# Patient Record
Sex: Male | Born: 1950 | Race: Black or African American | Hispanic: No | Marital: Single | Smoking: Never smoker
Health system: Southern US, Community
[De-identification: ages and names within clinical notes are randomized; demographics above are authoritative.]

## PROBLEM LIST (undated history)

## (undated) DIAGNOSIS — I10 Essential (primary) hypertension: Secondary | ICD-10-CM

## (undated) DIAGNOSIS — Z972 Presence of dental prosthetic device (complete) (partial): Secondary | ICD-10-CM

## (undated) DIAGNOSIS — J45909 Unspecified asthma, uncomplicated: Secondary | ICD-10-CM

## (undated) DIAGNOSIS — E7800 Pure hypercholesterolemia, unspecified: Secondary | ICD-10-CM

## (undated) DIAGNOSIS — R0602 Shortness of breath: Secondary | ICD-10-CM

## (undated) HISTORY — DX: Unspecified asthma, uncomplicated: J45.909

## (undated) HISTORY — DX: Shortness of breath: R06.02

## (undated) HISTORY — DX: Essential (primary) hypertension: I10

---

## 1986-07-17 HISTORY — PX: HERNIA REPAIR: SHX51

## 1998-02-23 ENCOUNTER — Emergency Department (HOSPITAL_COMMUNITY): Admission: EM | Admit: 1998-02-23 | Discharge: 1998-02-23 | Payer: Self-pay | Admitting: Emergency Medicine

## 1998-11-27 ENCOUNTER — Emergency Department (HOSPITAL_COMMUNITY): Admission: EM | Admit: 1998-11-27 | Discharge: 1998-11-27 | Payer: Self-pay | Admitting: Emergency Medicine

## 1999-08-27 ENCOUNTER — Emergency Department (HOSPITAL_COMMUNITY): Admission: EM | Admit: 1999-08-27 | Discharge: 1999-08-27 | Payer: Self-pay | Admitting: Emergency Medicine

## 1999-08-27 ENCOUNTER — Encounter: Payer: Self-pay | Admitting: Emergency Medicine

## 2000-10-12 ENCOUNTER — Emergency Department (HOSPITAL_COMMUNITY): Admission: EM | Admit: 2000-10-12 | Discharge: 2000-10-12 | Payer: Self-pay | Admitting: Emergency Medicine

## 2003-03-26 ENCOUNTER — Emergency Department (HOSPITAL_COMMUNITY): Admission: EM | Admit: 2003-03-26 | Discharge: 2003-03-26 | Payer: Self-pay | Admitting: Emergency Medicine

## 2003-07-20 ENCOUNTER — Emergency Department (HOSPITAL_COMMUNITY): Admission: EM | Admit: 2003-07-20 | Discharge: 2003-07-20 | Payer: Self-pay | Admitting: Emergency Medicine

## 2003-09-19 ENCOUNTER — Emergency Department (HOSPITAL_COMMUNITY): Admission: EM | Admit: 2003-09-19 | Discharge: 2003-09-19 | Payer: Self-pay | Admitting: Emergency Medicine

## 2003-12-15 ENCOUNTER — Emergency Department (HOSPITAL_COMMUNITY): Admission: EM | Admit: 2003-12-15 | Discharge: 2003-12-15 | Payer: Self-pay | Admitting: Emergency Medicine

## 2009-07-17 HISTORY — PX: CARDIAC CATHETERIZATION: SHX172

## 2009-10-16 ENCOUNTER — Encounter: Payer: Self-pay | Admitting: Cardiovascular Disease

## 2009-10-16 ENCOUNTER — Emergency Department (HOSPITAL_COMMUNITY): Admission: EM | Admit: 2009-10-16 | Discharge: 2009-10-16 | Payer: Self-pay | Admitting: Emergency Medicine

## 2009-10-19 DIAGNOSIS — I10 Essential (primary) hypertension: Secondary | ICD-10-CM

## 2009-10-19 DIAGNOSIS — R079 Chest pain, unspecified: Secondary | ICD-10-CM

## 2009-10-19 DIAGNOSIS — J45909 Unspecified asthma, uncomplicated: Secondary | ICD-10-CM

## 2009-10-20 ENCOUNTER — Ambulatory Visit: Payer: Self-pay | Admitting: Cardiovascular Disease

## 2009-10-20 ENCOUNTER — Encounter (INDEPENDENT_AMBULATORY_CARE_PROVIDER_SITE_OTHER): Payer: Self-pay | Admitting: *Deleted

## 2009-12-24 ENCOUNTER — Encounter (INDEPENDENT_AMBULATORY_CARE_PROVIDER_SITE_OTHER): Payer: Self-pay | Admitting: *Deleted

## 2010-01-20 ENCOUNTER — Ambulatory Visit: Payer: Self-pay | Admitting: Internal Medicine

## 2010-01-20 ENCOUNTER — Encounter: Payer: Self-pay | Admitting: Physician Assistant

## 2010-01-26 ENCOUNTER — Telehealth (INDEPENDENT_AMBULATORY_CARE_PROVIDER_SITE_OTHER): Payer: Self-pay | Admitting: *Deleted

## 2010-01-27 ENCOUNTER — Encounter: Payer: Self-pay | Admitting: Cardiovascular Disease

## 2010-01-27 ENCOUNTER — Ambulatory Visit: Payer: Self-pay | Admitting: Cardiology

## 2010-01-27 ENCOUNTER — Encounter: Payer: Self-pay | Admitting: Cardiology

## 2010-01-27 ENCOUNTER — Ambulatory Visit: Payer: Self-pay

## 2010-01-27 ENCOUNTER — Encounter (HOSPITAL_COMMUNITY): Admission: RE | Admit: 2010-01-27 | Discharge: 2010-03-23 | Payer: Self-pay | Admitting: Cardiovascular Disease

## 2010-01-27 ENCOUNTER — Telehealth (INDEPENDENT_AMBULATORY_CARE_PROVIDER_SITE_OTHER): Payer: Self-pay | Admitting: *Deleted

## 2010-02-03 ENCOUNTER — Ambulatory Visit: Payer: Self-pay | Admitting: Cardiovascular Disease

## 2010-02-03 ENCOUNTER — Telehealth (INDEPENDENT_AMBULATORY_CARE_PROVIDER_SITE_OTHER): Payer: Self-pay | Admitting: *Deleted

## 2010-02-03 ENCOUNTER — Ambulatory Visit (HOSPITAL_COMMUNITY): Admission: RE | Admit: 2010-02-03 | Discharge: 2010-02-03 | Payer: Self-pay | Admitting: Cardiovascular Disease

## 2010-02-07 ENCOUNTER — Encounter (INDEPENDENT_AMBULATORY_CARE_PROVIDER_SITE_OTHER): Payer: Self-pay | Admitting: *Deleted

## 2010-02-28 ENCOUNTER — Ambulatory Visit: Payer: Self-pay | Admitting: Cardiovascular Disease

## 2010-03-01 ENCOUNTER — Encounter (INDEPENDENT_AMBULATORY_CARE_PROVIDER_SITE_OTHER): Payer: Self-pay | Admitting: *Deleted

## 2010-03-07 ENCOUNTER — Telehealth (INDEPENDENT_AMBULATORY_CARE_PROVIDER_SITE_OTHER): Payer: Self-pay | Admitting: *Deleted

## 2010-03-07 ENCOUNTER — Telehealth: Payer: Self-pay | Admitting: Cardiovascular Disease

## 2010-03-08 ENCOUNTER — Ambulatory Visit (HOSPITAL_COMMUNITY): Admission: RE | Admit: 2010-03-08 | Discharge: 2010-03-08 | Payer: Self-pay | Admitting: Cardiovascular Disease

## 2010-05-27 ENCOUNTER — Ambulatory Visit: Payer: Self-pay | Admitting: Cardiology

## 2010-05-27 ENCOUNTER — Ambulatory Visit: Payer: Self-pay | Admitting: Cardiovascular Disease

## 2010-05-27 DIAGNOSIS — I472 Ventricular tachycardia, unspecified: Secondary | ICD-10-CM | POA: Insufficient documentation

## 2010-05-31 ENCOUNTER — Encounter: Payer: Self-pay | Admitting: Cardiovascular Disease

## 2010-06-07 ENCOUNTER — Ambulatory Visit: Payer: Self-pay | Admitting: Cardiovascular Disease

## 2010-06-24 ENCOUNTER — Ambulatory Visit: Payer: Self-pay | Admitting: Pulmonary Disease

## 2010-06-24 ENCOUNTER — Encounter: Payer: Self-pay | Admitting: Pulmonary Disease

## 2010-08-18 NOTE — Letter (Signed)
Summary: Appointment - Missed  [facility], Main Office  [address]   [place], [place] [address]   Phone: [phone]  Fax: [phone]     December 24, 2009 MRN: [id number]   [facility] [address] [place], [place]  [address]   Dear Benjamin Hamilton,  Our records indicate you missed your appointment on 12/17/2009 with Dr. Eden Emms. It is very important that we reach you to reschedule this appointment. We look forward to participating in your health care needs. Please contact us at the number listed above at your earliest convenience to reschedule this appointment.     Sincerely,   [facility] Scheduling Team

## 2010-08-18 NOTE — Letter (Signed)
Summary: Cardiac Catheterization Instructions- Main Lab  [facility], Main Office  [address]   [place], [place] [address]   Phone: [phone]  Fax: [phone]     01/27/2010 MRN: [id number]  [facility] [address] [place], [place]  [address]  Dear Mr. Mcclay,   You are scheduled for Cardiac Catheterization on     02/03/10          with Dr.  Eden Emms          .  Please arrive at the [facility] at     8:00  a.m./p.m. on the day of your procedure.  1. DIET     _X___ Nothing to eat or drink after midnight except your medications with a sip of water.  2. Come to the [place] office on             for lab work.  The lab at [facility] is open from 8:30 a.m. to 1:30 p.m. and 2:30 p.m. to 5:00 p.m.  The lab at [facility] is open from 7:30 a.m. to 5:30 p.m.  You do not have to be fasting.  3. MAKE SURE YOU TAKE YOUR ASPIRIN.  4. _____ DO NOT TAKE these medications before your procedure:         ________________________________________________________________________________ _     __X_ YOU MAY TAKE ALL of your remaining medications with a small amount of water.      ____ START NEW medications:     ________________________________________________________________________________      ____ Eilene Ghazi instructions:     ________________________________________________________________________________  5. Plan for one night stay - bring personal belongings (i.e. toothpaste, toothbrush, etc.)  6. Bring a current list of your medications and current insurance cards.  7. Must have a responsible person to drive you home.   8. Someone must be with you for the first 24 hours after you arrive home.  9. Please wear clothes that are easy to get on and off and wear slip-on shoes.  *Special note: Every effort is made to have your procedure done on time.  Occasionally there are emergencies that present themselves at the  hospital that may cause delays.  Please be patient if a delay does occur.  If you have any questions after you get home, please call the office at the number listed above. DR PETER NISHAN/ Scherrie Bateman, LPN

## 2010-08-18 NOTE — Letter (Signed)
Summary: Appointment - Cardiac MRI  [facility], Main Office  [address]   [place], [place] [address]   Phone: [phone]  Fax: [phone]      March 01, 2010 MRN: [id number]   [facility] [address] [place], [place]  [address]   Dear Benjamin Hamilton,   We have scheduled the above patient for an appointment for a Cardiac MRI on           at        .  Please refer to the below information for the location and instructions for this test:  Location:     [facility]       [address]       [place], [place]  [address] Instructions:    Wilmon Arms at [facility] Outpatient Registration 45 minutes prior to your appointment time.  This will ensure you are in the Radiology Department 30 minutes prior to your appointment.    There are no restrictions for this test you may eat and take medications as usual.  If you need to reschedule this appointment please call at the number listed above.  Sincerely,  Glass blower/designer

## 2010-08-18 NOTE — Assessment & Plan Note (Signed)
Summary: per check out/sf   CC:  sob.  History of Present Illness: F/U post cath. 7/21.  Abnormal myovue with 5 bts of polymorphic VT.  Cath was normal with normal EF also.  Since cath continues to have some SSCP.  Worse in recumbency at nigth ? reflux. Compliant with BP meds.  ECG with normal QT  400 with bifid T's latterally Wanted to do cardiac MRI  to R/O infiltrative disease but patient to claustraphobic. Has had abnromal interstitial lung exam  F/U CT showed  Mild diffuse bronchial wall thickening may be infectious or inflammatory in etiology.  No underlying changes of UIP.  No pulmonary parenchymal nodule or mass.  Thinks his lungs "shutdown" at night after he comes home from work.  He is exposed to "chemicals" at work.  Denies history of asthma.    Givne his continued dyspnea and abnormal CT we will get PFT's and refer to pulmonary  Asked to to go to drug store and get BP checked at least 2x/week.  May need more BP med than norvasc.    Current Problems (verified): 1)  Paroxysmal Ventricular Tachycardia  (ICD-427.1) 2)  Hypertension  (ICD-401.9) 3)  Shortness of Breath  (ICD-786.05) 4)  Chest Pain  (ICD-786.50)  Current Medications (verified): 1)  Albuterol Sulfate (5 Mg/ml) 0.5% Nebu (Albuterol Sulfate) .... As Needed 2)  Norvasc 10 Mg Tabs (Amlodipine Besylate) .... Take One Tablet By Mouth Daily.  Allergies (verified): No Known Drug Allergies  Past History:  Past Medical History: Last updated: 10/19/2009 Current Problems:  HYPERTENSION (ICD-401.9) SHORTNESS OF BREATH (ICD-786.05) CHEST PAIN (ICD-786.50) ER 4/11 Bronchospasm.  R/O D/C with inhaler and prednisone  Bronchospasm  Family History: Last updated: 05/27/2010 non contributory  Social History: Last updated: 10/19/2009 non-smoker, no drug abuse, occasional drinker  Review of Systems       Denies fever, malais, weight loss, blurry vision, decreased visual acuity, cough, sputum, hemoptysis, pleuritic  pain, palpitaitons, heartburn, abdominal pain, melena, lower extremity edema, claudication, or rash.   Vital Signs:  Patient profile:   60 year old male Height:      73 inches Weight:      233 pounds BMI:     30.85 Pulse rate:   67 / minute Resp:     14 per minute BP sitting:   150 / 89  (left arm)  Vitals Entered By: Kem Parkinson (June 07, 2010 3:25 PM)  Physical Exam  General:  Affect appropriate Healthy:  appears stated age HEENT: normal Neck supple with no adenopathy JVP normal no bruits no thyromegaly Lungs clear with no wheezing and good diaphragmatic motion Heart:  S1/S2 no murmur,rub, gallop or click PMI normal Abdomen: benighn, BS positve, no tenderness, no AAA no bruit.  No HSM or HJR Distal pulses intact with no bruits No edema Neuro non-focal Skin warm and dry    Impression & Recommendations:  Problem # 1:  PAROXYSMAL VENTRICULAR TACHYCARDIA (ICD-427.1) Non recurrent and normla EF and no CAD with normal QT  Follow His updated medication list for this problem includes:    Norvasc 10 Mg Tabs (Amlodipine besylate) .Marland Kitchen... Take one tablet by mouth daily.  Problem # 2:  HYPERTENSION (ICD-401.9) F/U with home readings  May need to add ACE His updated medication list for this problem includes:    Norvasc 10 Mg Tabs (Amlodipine besylate) .Marland Kitchen... Take one tablet by mouth daily.  Problem # 3:  SHORTNESS OF BREATH (ICD-786.05) PFT's and F/U pulmonary for persistant dyspnea  and  abnomal CT His updated medication list for this problem includes:    Norvasc 10 Mg Tabs (Amlodipine besylate) .Marland Kitchen... Take one tablet by mouth daily.  Orders: Pulmonary Referral (Pulmonary) Pulmonary Function Test (PFT)  Problem # 4:  CHEST PAIN (ICD-786.50) May be more relfective of lung issue.  No epicardial CAD and Syndorme X or small vessel disease more nebulous to Dx and Rx His updated medication list for this problem includes:    Norvasc 10 Mg Tabs (Amlodipine besylate) .Marland Kitchen...  Take one tablet by mouth daily.  Patient Instructions: 1)  Your physician recommends that you schedule a follow-up appointment in: 3 MONTHS 2)  You have been referred to PULMONARY-SOB 3)  Your physician has recommended that you have a pulmonary function test.  Pulmonary Function Tests are a group of tests that measure how well air moves in and out of your lungs.

## 2010-08-18 NOTE — Letter (Signed)
Summary: Return To Work  [facility], Main Office  [address]   [place], [place] [address]   Phone: [phone]  Fax: [phone]    02/07/2010  TO: WHOM IT MAY CONCERN   RE: Benjamin Hamilton [address] [place]   The above named individual is under my medical care and may return to work JJ:OACZYS 02/07/10 WITH NO RESTRICTIONS  If you have any further questions or need additional information, please call.     Sincerely,    Deliah Goody, RN

## 2010-08-18 NOTE — Letter (Signed)
Summary: Appointment - Cardiac MRI  [facility], Main Office  [address]   [place], [place] [address]   Phone: [phone]  Fax: [phone]      March 01, 2010 MRN: [id number]   [facility] [address] [place], [place]  [address]   Dear Mr. Bero,   We have scheduled the above patient for an appointment for a Cardiac MRI on 03-08-2010 at 1:00 p.m.  Please refer to the below information for the location and instructions for this test:  Location:     [facility]       [address]       [place], [place]  [address] Instructions:    Wilmon Arms at [facility] Outpatient Registration 45 minutes prior to your appointment time.  This will ensure you are in the Radiology Department 30 minutes prior to your appointment.    There are no restrictions for this test you may eat and take medications as usual.  If you need to reschedule this appointment please call at the number listed above.  Sincerely,       Lorne Skeens [facility] Scheduling Team

## 2010-08-18 NOTE — Letter (Signed)
Summary: Generic Letter  [facility], Main Office  [address]   [place], [place] [address]   Phone: [phone]  Fax: [phone]        May 31, 2010 MRN: [id number]    [facility] [address] [place], [place]  [address]    Dear Mr. Schwalb,  Unable to reach you by phone both numbers we have on file have been disconnected re test results.Per Dr Eden Emms need to be seen by lung specialists please call our office to get visit scheduled.        Sincerely, Dr Asher Muir, LPN  This letter has been electronically signed by your physician.

## 2010-08-18 NOTE — Progress Notes (Signed)
Summary: Pt Restriction ?   Phone Note Other Incoming   Caller: Pt employer Summary of Call: Pt place of work needs to know if he still has restrictions on Lifting, Call Lorenza Burton back @ [phone]/Fax [phone] Initial call taken by: Denny Peon  Follow-up for Phone Call        No restrictions Follow-up by: Colon Branch, MD, [facility],  March 07, 2010 9:50 AM  Additional Follow-up for Phone Call Additional follow up Details #1::        spoke with Morrie Sheldon, note faxed to her Deliah Goody, RN  March 07, 2010 10:07 AM

## 2010-08-18 NOTE — Assessment & Plan Note (Signed)
Summary: consult for dyspnea   Visit Type:  Initial Consult Copy to:  Charlton Haws MD  CC:  pulmonary consult. Increased sob in the evening, chest pains, pt states he feels like his body shuts down, and dry cough..  History of Present Illness: The pt is a 60y/o male who I have been asked to see for dyspnea and atypical chest pain.  For the last 8-73mos the pt has been having "episodes" that occur after work, but do not occur during work.  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  Again, he does not have any issues while at work, but when he gets in his truck to come home, "everything shuts down".  He describes cough, followed by chest fluttering and chest tightness, and some foamy mucus production.  He typically begins to get short of breath as well.  The pt will go home, and if he walks a mile, his breathing "opens back up".   The symptoms occur everynight after work, but do not occur on weekends.  He denies any GERD symptoms.  He has had a cardiac myoview that showed polymorphic VT and EF 49%.  This was followed by a cath which showed no significant coronary artery obstruction, and normal EF.  The pt has had a ct chest which showed only minimal thickening of bronchial walls, but no significant process.  He has had full pfts which are totally normal.  The pt denies any h/o childhood asthma.  Current Medications (verified): 1)  Albuterol Sulfate (5 Mg/ml) 0.5% Nebu (Albuterol Sulfate) .... As Needed 2)  Norvasc 10 Mg Tabs (Amlodipine Besylate) .... Take One Tablet By Mouth Daily.  Allergies (verified): No Known Drug Allergies  Past History:  Past Medical History:  HYPERTENSION (ICD-401.9) SHORTNESS OF BREATH (ICD-786.05) CHEST PAIN (ICD-786.50) --negative cath 2011  Past Surgical History: none  Family History: Reviewed history from 05/27/2010 and no changes required. no known family history  Social  History: Reviewed history from 10/19/2009 and no changes required. no drug abuse, occasional drinker occupation: works for [employer] never smoker single  Review of Systems       The patient complains of shortness of breath at rest, productive cough, and weight change.  The patient denies shortness of breath with activity, non-productive cough, coughing up blood, chest pain, irregular heartbeats, acid heartburn, indigestion, loss of appetite, abdominal pain, difficulty swallowing, sore throat, tooth/dental problems, headaches, nasal congestion/difficulty breathing through nose, sneezing, itching, ear ache, anxiety, depression, hand/feet swelling, joint stiffness or pain, rash, change in color of mucus, and fever.    Vital Signs:  Patient profile:   60 year old male Height:      73 inches Weight:      230 pounds BMI:     30.45 O2 Sat:      96 % on Room air Temp:     98.1 degrees F oral Pulse rate:   72 / minute BP sitting:   122 / 88  (left arm) Cuff size:   large  Vitals Entered By: Carver Fila (June 24, 2010 11:07 AM)  O2 Flow:  Room air CC: pulmonary consult. Increased sob in the evening, chest pains, pt states he feels like his body shuts down, dry cough. Comments meds and allergies updated Phone number updated Carver Fila  June 24, 2010 11:21 AM    Physical Exam  General:  ow male in nad Eyes:  PERRLA and EOMI.   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  His ct chest and pfts are really unremarkable, but this would not be unexpected for occupational asthma.  My only other thought is whether this could be an atypical presentation for GERD.  I would like to set him up for a methacholine challenge test, but also treat him with a PPI in the interim.  He will let me know if his symptoms improve on the PPI so that we can cancel his pulmonary testing.    Other Orders: Consultation Level IV ([address]) Pulmonary Referral (Pulmonary)  Patient Instructions: 1)  will start you on dexilant 60mg  each am for next 2 weeks for possible reflux.  If this is not helping, will do a special test at [place] called methacholine challenge to look for asthma 2)  will arrange for followup once test results are available.  If the dexilant helps you, call me and we will cancel breathing test. 3)  stop albuterol

## 2010-08-18 NOTE — Letter (Signed)
Summary: Out of Work  [facility], Main Office  [address]   [place], [place] [address]   Phone: [phone]  Fax: [phone]    January 27, 2010   Employee:  Benjamin Hamilton    To Whom It May Concern:   For Medical reasons, please excuse the above named employee from work for the following dates:  Start:   02/03/10 AND 02/04/10 RETURN DATE PENDING JOB DUTIES  End:   PENDING  If you need additional information, please feel free to contact our office.         Sincerely,   DR Asher Muir, LPN

## 2010-08-18 NOTE — Assessment & Plan Note (Signed)
Summary: EPH/Cardiac Eval-MB   History of Present Illness: 60 yo sent from Dr Fonnie Jarvis and ER for w.u SSCP.  Seen in ER 4/2 and D/C with R/O and Rx for bronchospasm.  Rx with albuterol and steroid taper.  Reviewed records:  CXR no CHF, normal BNP, R/O, ECG no acute changes LAD and nonspecific ST/T wave changes.  He has had improvement in his symptoms since D/C.  He walks every day without SSCP.  He has never had an ETT.  Given his abnormal ECG and HTN it would be reasonable to do  a stress test on him.  We will see if we can randomize him to [facility].  Cardiovascular Risk History:      Positive major cardiovascular risk factors include male age 81 years old or older and hypertension.    Current Problems (verified): 1)  Hypertension  (ICD-401.9) 2)  Shortness of Breath  (ICD-786.05) 3)  Chest Pain  (ICD-786.50)  Current Medications (verified): 1)  Aspirin Ec 325 Mg Tbec (Aspirin) .... Take One Tablet By Mouth Daily 2)  Albuterol Sulfate (5 Mg/ml) 0.5% Nebu (Albuterol Sulfate) .... As Needed 3)  Blood Pressure Mediatcion .Marland Kitchen.. 1 Tab By Mouth Once Daily...Marland Kitchenhe Will Call us To Find Out His Medication List  Allergies (verified): No Known Drug Allergies  Past History:  Past Medical History: Last updated: 10/19/2009 Current Problems:  HYPERTENSION (ICD-401.9) SHORTNESS OF BREATH (ICD-786.05) CHEST PAIN (ICD-786.50) ER 4/11 Bronchospasm.  R/O D/C with inhaler and prednisone  Bronchospasm  Social History: Last updated: 10/19/2009 non-smoker, no drug abuse, occasional drinker  Review of Systems       Denies fever, malais, weight loss, blurry vision, decreased visual acuity, cough, sputum, SOB, hemoptysis, pleuritic pain, palpitaitons, heartburn, abdominal pain, melena, lower extremity edema, claudication, or rash.   Vital Signs:  Patient profile:   60 year old male Weight:      260 pounds Pulse rate:   64 / minute Resp:     14 per minute BP sitting:   140 / 90  (left arm)  Vitals  Entered By: Kem Parkinson (October 20, 2009 9:17 AM)  Physical Exam  General:  Affect appropriate Healthy:  appears stated age HEENT: normal Neck supple with no adenopathy JVP normal no bruits no thyromegaly Lungs clear with no wheezing and good diaphragmatic motion Heart:  S1/S2 no murmur,rub, gallop or click PMI normal Abdomen: benighn, BS positve, no tenderness, no AAA no bruit.  No HSM or HJR Distal pulses intact with no bruits No edema Neuro non-focal Skin warm and dry    Impression & Recommendations:  Problem # 1:  HYPERTENSION (ICD-401.9) Well controlled will call us with his BP pill for our records His updated medication list for this problem includes:    Aspirin Ec 325 Mg Tbec (Aspirin) .Marland Kitchen... Take one tablet by mouth daily  Problem # 2:  SHORTNESS OF BREATH (ICD-786.05) Improved likely bronchospasm.  Roids finished continue as needed inhaler His updated medication list for this problem includes:    Aspirin Ec 325 Mg Tbec (Aspirin) .Marland Kitchen... Take one tablet by mouth daily  Problem # 3:  CHEST PAIN (ICD-786.50) HTN, ECG abnormal Randomize to [facility].  Also check LV function with stress test His updated medication list for this problem includes:    Aspirin Ec 325 Mg Tbec (Aspirin) .Marland Kitchen... Take one tablet by mouth daily  Cardiovascular Risk Assessment/Plan:      The patient's hypertensive risk group is category B: At least one risk factor (excluding diabetes)  with no target organ damage.  Today's blood pressure is 140/90.       EKG Report  Procedure date:  10/16/2009  Findings:      NSR 64 LAD Nonspecific ST/T wave changes

## 2010-08-18 NOTE — Cardiovascular Report (Signed)
Summary: Pre Cath/Percutaneous Orders   Pre Cath/Percutaneous Orders   Imported By: Roderic Ovens 02/02/2010 10:28:38  _____________________________________________________________________  External Attachment:    Type:   Image     Comment:   External Document

## 2010-08-18 NOTE — Assessment & Plan Note (Signed)
Summary: Cardiology Nuclear Study  Nuclear Med Background Indications for Stress Test: Evaluation for Ischemia     Symptoms: Chest Pain with Exertion, Chest Tightness with Exertion, DOE, Fatigue with Exertion, Near Syncope, Palpitations, Rapid HR    Nuclear Pre-Procedure Cardiac Risk Factors: Hypertension Caffeine/Decaff Intake: None NPO After: 6:30 PM Lungs: Clear IV 0.9% NS with Angio Cath: 18g     IV Site: (R) AC IV Started by: Stanton Kidney EMT-P Chest Size (in) 48     Height (in): 73 Weight (lb): 239 BMI: 31.65 Tech Comments: The EKG's and Images were reviewed by Dr. Odessa Fleming, and I discussed with Dr. Eden Emms who will schedule the patient for a cath next week.Patsy Edwards,RN.  Nuclear Med Study 1 or 2 day study:  1 day     Stress Test Type:  Eugenie Birks Reading MD:  Olga Millers, MD     Referring MD:  P.Nishan Resting Radionuclide:  Technetium 23m Tetrofosmin     Resting Radionuclide Dose:  10.2 mCi  Stress Radionuclide:  Technetium 31m Tetrofosmin     Stress Radionuclide Dose:  33.0 mCi   Stress Protocol Exercise Time (min):  2:25 min     Max HR:  115 bpm Max Systolic BP: 189 mm HgRate Pressure Product:  04540  Lexiscan: 0.4 mg   Stress Test Technologist:  Irean Hong RN     Nuclear Technologist:  Domenic Polite CNMT  Rest Procedure  Myocardial perfusion imaging was performed at rest 45 minutes following the intravenous administration of Myoview Technetium 87m Tetrofosmin.  Stress Procedure  The patient  attempted treadmill but unable to reach target heartrate due to marked fatigue ,and V- ectopy with 5 beats polymorphic VT, PVCs,couplets; changed to Lexiscan.The patient received IV Lexiscan 0.4 mg over 15-seconds.  Myoview injected at 30-seconds. The EKG was nondiagnostic due to baseline changes. There were occ. PVC's and 3 beats NSVT x2 after lexiscan injection Quantitative spect images were obtained after a 45 minute delay.  QPS Raw Data Images:  There is no  interference from other nuclear activity. Stress Images:  There is decreased uptake in the inferior wall. Rest Images:  There is decreased uptake in the inferior wall. Subtraction (SDS):  No evidence of ischemia. Transient Ischemic Dilatation:  1.08  (Normal <1.22)  Lung/Heart Ratio:  .28  (Normal <0.45)  Quantitative Gated Spect Images QGS EDV:  152 ml QGS ESV:  82 ml QGS EF:  46 % QGS cine images:  Visually, LV function appears to be normal with normal wall motion; evidence of LVE; suggest echo to better assess.   Overall Impression  Exercise Capacity: Lexiscan study with no exercise. BP Response: Normal blood pressure response. Clinical Symptoms: No chest pain ECG Impression: No significant ST segment change suggestive of ischemia; 5 beats NSVT with exercise; study changed to lexiscan stress. Overall Impression: There appears to be inferior thinning but no sign of scar or ischemia; patient seen by Dr. Eden Emms and cath. scheduled for next week.

## 2010-08-18 NOTE — Assessment & Plan Note (Signed)
Summary: @ 12/pt. c/o uncomfortable feeling in his chest without pain/sl   Visit Type:  Follow-up  CC:  chest pain and sob.  History of Present Illness: This is a 60 year old African American male patient who is here today because of recurrent chest pain occurring while lifting 3-400 pounds at work. He had been in the ER back in April with chest pain and was referred to Dr. Eden Emms for followup. He was enrolled in the Promise study and stress Myoview was ordered. The patient was a no-show an hour had this done.  The patient says he lifts 300-400 pounds at work in feels like someone is hitting him in the chest and he gets short of breath he also develops a cough. He is a poor historian it is hard to get a detailed history from him. He denies sharp shooting pains pressure or tightness dizziness or presyncope. The chest pain usually only occurs with the heavy lifting.  Current Medications (verified): 1)  Albuterol Sulfate (5 Mg/ml) 0.5% Nebu (Albuterol Sulfate) .... As Needed 2)  Norvasc 5 Mg Tabs (Amlodipine Besylate) .... Take One Daily  Allergies: No Known Drug Allergies  Past History:  Past Medical History: Last updated: 10/19/2009 Current Problems:  HYPERTENSION (ICD-401.9) SHORTNESS OF BREATH (ICD-786.05) CHEST PAIN (ICD-786.50) ER 4/11 Bronchospasm.  R/O D/C with inhaler and prednisone  Bronchospasm  Review of Systems       see history of present illness  Vital Signs:  Patient profile:   60 year old male Height:      73 inches Weight:      240 pounds BMI:     31.78 Pulse rate:   68 / minute Pulse rhythm:   regular BP sitting:   140 / 90  (right arm)  Vitals Entered By: Jacquelin Hawking, CMA (January 20, 2010 11:58 AM)  Physical Exam  General:   Well-nournished, in no acute distress. Neck: No JVD, HJR, Bruit, or thyroid enlargement Lungs: No tachypnea, clear without wheezing, rales, or rhonchi Cardiovascular: RRR, PMI not displaced, heart sounds normal, no murmurs,  gallops, bruit, thrill, or heave. Abdomen: BS normal. Soft without organomegaly, masses, lesions or tenderness. Extremities: without cyanosis, clubbing or edema. Good distal pulses bilateral SKin: Warm, no lesions or rashes  Musculoskeletal: No deformities Neuro: no focal signs    EKG  Procedure date:  01/20/2010  Findings:      normal sinus rhythm with T wave inversion laterally no acute change  Impression & Recommendations:  Problem # 1:  CHEST PAIN (ICD-786.50) Patient continues to have exertional chest pain. I told him he needed to have a stress Myoview. I did give him a note to temporarily stop heavy lifting at work until we performed a stress Myoview. He says he will try to work out a time that he can come. The following medications were removed from the medication list:    Aspirin Ec 325 Mg Tbec (Aspirin) .Marland Kitchen... Take one tablet by mouth daily His updated medication list for this problem includes:    Norvasc 10 Mg Tabs (Amlodipine besylate) .Marland Kitchen... Take one tablet by mouth daily.  Orders: EKG w/ Interpretation (93000) Nuclear Stress Test (Nuc Stress Test)  Problem # 2:  HYPERTENSION (ICD-401.9) Patient's blood pressure is elevated. We will increase his Norvasc to 10 mg daily. Patient had left the office before we could give him this increase in his Norvasc. His numbers that we have are all disconnected. We will give him a prescription when he returns July 14 for  a stress test. The following medications were removed from the medication list:    Aspirin Ec 325 Mg Tbec (Aspirin) .Marland Kitchen... Take one tablet by mouth daily His updated medication list for this problem includes:    Norvasc 10 Mg Tabs (Amlodipine besylate) .Marland Kitchen... Take one tablet by mouth daily.  Patient Instructions: 1)  Your physician recommends that you schedule a follow-up appointment in: 1 months with Dr. Eden Emms 2)  Your physician has requested that you have an exercise stress myoview.  For further information please  visit [email or web address]  Please follow instruction sheet, as given. 3)  No heavy lifting. 4)  Increase Norvasc to 5 mg take 2 tablets once a day. Prescriptions: NORVASC 10 MG TABS (AMLODIPINE BESYLATE) take one tablet by mouth daily.  #30 x 8   Entered by:   Ollen Gross, RN, BSN   Authorized by:   Marletta Lor, PA-C   Signed by:   Ollen Gross, RN, BSN on 01/20/2010   Method used:   Print then Give to Patient   RxID:   [id number]

## 2010-08-18 NOTE — Progress Notes (Signed)
  Pt Dropped Off FMLA papers, also completed packet sent to [facility]  February 03, 2010 2:34 PM

## 2010-08-18 NOTE — Assessment & Plan Note (Signed)
Summary: rov/ gd   Visit Type:  Follow-up   History of Present Illness: F/U post cath. 7/21.  Abnormal myovue with 5 bts of polymorphic VT.  Cath was normal with normal EF also.  Since cath continues to have some SSCP.  Worse in recumbency at [facility] ? reflux.  Wrist has healed well.  Cath done radialy.  Compliant with BP meds.  ECG with normal QT  400 with bifid T's latterally Wanted to do cardiac MRI  to R/O infiltrative disease but patient to claustraphobic.  His lung exam was abnormal today and he complains of continued tightness and dyspnea with exertion.  Has "foam" in his mouth but no cough and occasional wheezing after exertion.    With abnormal ECG and continued SSCP despite normal epicardial cors ? of syndrome X is raised.  I would like to do a chest CT first to R/O ILD or other thoracic pathology before pursuing this.  He would have to be done at [facility] or other location as we really dont do endothelial testing with acetocholine    Current Problems (verified): 1)  Hypertension  (ICD-401.9) 2)  Shortness of Breath  (ICD-786.05) 3)  Chest Pain  (ICD-786.50)  Current Medications (verified): 1)  Albuterol Sulfate (5 Mg/ml) 0.5% Nebu (Albuterol Sulfate) .... As Needed 2)  Norvasc 10 Mg Tabs (Amlodipine Besylate) .... Take One Tablet By Mouth Daily.  Allergies (verified): No Known Drug Allergies  Past History:  Past Medical History: Last updated: 10/19/2009 Current Problems:  HYPERTENSION (ICD-401.9) SHORTNESS OF BREATH (ICD-786.05) CHEST PAIN (ICD-786.50) ER 4/11 Bronchospasm.  R/O D/C with inhaler and prednisone  Bronchospasm  Family History: Last updated: 05/27/2010 non contributory  Social History: Last updated: 10/19/2009 non-smoker, no drug abuse, occasional drinker  Family History: non contributory  Review of Systems       Denies fever, malais, weight loss, blurry vision, decreased visual acuity, cough, sputum,  hemoptysis, pleuritic pain, palpitaitons,  heartburn, abdominal pain, melena, lower extremity edema, claudication, or rash.   Vital Signs:  Patient profile:   60 year old male Height:      73 inches Weight:      233 pounds BMI:     30.85 Pulse rate:   60 / minute BP sitting:   142 / 80  (left arm)  Vitals Entered By: Laurance Flatten CMA (May 27, 2010 2:39 PM)  Physical Exam  General:  Affect appropriate Healthy:  appears stated age HEENT: normal Neck supple with no adenopathy JVP normal no bruits no thyromegaly Lungs porr air movement with expitory wheeze and good diaphragmatic motion Heart:  S1/S2 no murmur,rub, gallop or click PMI normal Abdomen: benighn, BS positve, no tenderness, no AAA no bruit.  No HSM or HJR Distal pulses intact with no bruits No edema Neuro non-focal Skin warm and dry    Impression & Recommendations:  Problem # 1:  HYPERTENSION (ICD-401.9) Continue calcium blocker and low sodium diet His updated medication list for this problem includes:    Norvasc 10 Mg Tabs (Amlodipine besylate) .Marland Kitchen... Take one tablet by mouth daily.  Problem # 2:  SHORTNESS OF BREATH (ICD-786.05) Normal cath  Abnormal lung exam.  CT chest with high res images R/O sarcoid, ILD or other pulmonary disease His updated medication list for this problem includes:    Norvasc 10 Mg Tabs (Amlodipine besylate) .Marland Kitchen... Take one tablet by mouth daily.  Orders: CT Scan  (CT Scan)  Problem # 3:  CHEST PAIN (ICD-786.50) Normal cath.  If no lung  pathology found consider referral to [facility] for endothelial testing and w/u of syndrome X His updated medication list for this problem includes:    Norvasc 10 Mg Tabs (Amlodipine besylate) .Marland Kitchen... Take one tablet by mouth daily.  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  Bifid T waves with QT 400  will ask SK to review ECG.  No epicardial CAD.  Unable to do MRI.  EF normal and no  family history of sudden  death. His updated medication list for this problem includes:    Norvasc 10 Mg Tabs (Amlodipine besylate) .Marland Kitchen... Take one tablet by mouth daily.  Patient Instructions: 1)  Your physician recommends that you schedule a follow-up appointment in: PENDING CHEST CT 2)  Your physician recommends that you continue on your current medications as directed. Please refer to the Current Medication list given to you today. 3)  Non-Cardiac CT scanning, (CAT scanning), is a noninvasive, special x-ray that produces cross-sectional images of the body using x-rays and a computer. CT scans help physicians diagnose and treat medical conditions. For some CT exams, a contrast material is used to enhance visibility in the area of the body being studied. CT scans provide greater clarity and reveal more details than regular x-ray exams.TODAY   EKG Report  Procedure date:  05/27/2010  Findings:      NSR 60 LAD QT 440 Bifid T waves V3-V6

## 2010-08-18 NOTE — Miscellaneous (Signed)
  Clinical Lists Changes  Orders: Added new Referral order of Pulmonary Referral (Pulmonary) - Signed 

## 2010-08-18 NOTE — Miscellaneous (Signed)
Summary: Orders Update  Clinical Lists Changes  Orders: Added new Referral order of Nuclear Stress Test (Nuc Stress Test) - Signed 

## 2010-08-18 NOTE — Progress Notes (Signed)
Summary: Nuclear Pre Procedure  Phone Note Outgoing Call Call back at [facility] Phone [phone]   Call placed by: Rea College, CMA,  January 26, 2010 3:19 PM Call placed to: Patient Summary of Call: Reviewed information on Myoview Information Sheet (see scanned document for further details).  Patsy spoke with sister.      Nuclear Med Background Indications for Stress Test: Evaluation for Ischemia     Symptoms: Chest Pain with Exertion, DOE    Nuclear Pre-Procedure Cardiac Risk Factors: Hypertension Height (in): 73

## 2010-08-18 NOTE — Assessment & Plan Note (Signed)
Summary: rov/sl   CC:  sob same ongoing chest pain.  History of Present Illness: F/U post cath. 7/21.  Abnormal myovue with 5 bts of polymorphic VT.  Cath was normal with normal EF also.  Since cath continues to have some SSCP.  Worse in recumbency at [facility] ? reflux.  Wrist has healed well.  Cath done radialy.  Compliant with BP meds.  ECG with normal QT  394 Discussed with patient.  Recommend cardiac MRI to R/O infiltrative disease that could be responsible for polymorphic VT.   No metal in body and not claustrophobic.  Does not have a primary.  Needs Rx for reflux  Current Problems (verified): 1)  Hypertension  (ICD-401.9) 2)  Shortness of Breath  (ICD-786.05) 3)  Chest Pain  (ICD-786.50)  Current Medications (verified): 1)  Albuterol Sulfate (5 Mg/ml) 0.5% Nebu (Albuterol Sulfate) .... As Needed 2)  Norvasc 10 Mg Tabs (Amlodipine Besylate) .... Take One Tablet By Mouth Daily.  Allergies (verified): No Known Drug Allergies  Past History:  Past Medical History: Last updated: 10/19/2009 Current Problems:  HYPERTENSION (ICD-401.9) SHORTNESS OF BREATH (ICD-786.05) CHEST PAIN (ICD-786.50) ER 4/11 Bronchospasm.  R/O D/C with inhaler and prednisone  Bronchospasm  Social History: Last updated: 10/19/2009 non-smoker, no drug abuse, occasional drinker  Review of Systems       Denies fever, malais, weight loss, blurry vision, decreased visual acuity, cough, sputum, SOB, hemoptysis, pleuritic pain, palpitaitons, heartburn, abdominal pain, melena, lower extremity edema, claudication, or rash.   Vital Signs:  Patient profile:   59 year old male Height:      73 inches Weight:      234 pounds BMI:     30.98 Pulse rate:   75 / minute Resp:     14 per minute BP sitting:   136 / 80  (left arm)  Vitals Entered By: Kem Parkinson (February 28, 2010 3:18 PM)  Physical Exam  General:  Affect appropriate Healthy:  appears stated age HEENT: normal Neck supple with no  adenopathy JVP normal no bruits no thyromegaly Lungs clear with no wheezing and good diaphragmatic motion Heart:  S1/S2 no murmur,rub, gallop or click PMI normal Abdomen: benighn, BS positve, no tenderness, no AAA no bruit.  No HSM or HJR Distal pulses intact with no bruits No edema Neuro non-focal Skin warm and dry    Impression & Recommendations:  Problem # 1:  HYPERTENSION (ICD-401.9) Well controlled His updated medication list for this problem includes:    Norvasc 10 Mg Tabs (Amlodipine besylate) .Marland Kitchen... Take one tablet by mouth daily.  Problem # 2:  CHEST PAIN (ICD-786.50) Normal cath.  Cardiac MRI to R/O infiltrative disease in setting of polymorphic VT  Rx symptoms with prilosec OTC His updated medication list for this problem includes:    Norvasc 10 Mg Tabs (Amlodipine besylate) .Marland Kitchen... Take one tablet by mouth daily.  Orders: Cardiac MRI (Cardiac MRI)  Patient Instructions: 1)  Your physician recommends that you schedule a follow-up appointment in: 6 MONTHS 2)  Your physician has requested that you have a cardiac MRI.  Cardiac MRI uses a computer to create images of your heart as it's beating, producing both still and moving pictures of your heart and major blood vessels. For further information please visit  [email or web address]  Please follow the instruction sheet given to you today for more information.   Echocardiogram Report  Procedure date:  02/28/2010  EKG Report  Procedure date:  02/28/2010  Findings:  NSR 75 LAD Nonspecifc ST/T wave changes QT 394

## 2010-08-18 NOTE — Progress Notes (Signed)
  Walk in Patient Form Recieved " Pt needs letter stating he cannot Lift" Sent to Gaylyn [facility]ers [place]  March 07, 2010 8:09 AM

## 2010-08-18 NOTE — Miscellaneous (Signed)
Summary: Orders Update pft charges  Clinical Lists Changes  Orders: Added new Service order of Carbon Monoxide diffusing w/capacity (94720) - Signed Added new Service order of Lung Volumes (94240) - Signed Added new Service order of Spirometry (Pre & Post) (94060) - Signed 

## 2010-08-18 NOTE — Progress Notes (Signed)
  Phone Note Outgoing Call   Call placed by: Scherrie Bateman, LPN,  January 27, 2010 2:21 PM Call placed to: Patient'S  SISTER Summary of Call: LEFT WORD FOR PT TO CALL NEED TO SET UP CATH FOR NEXT THURS PER DR Eden Emms.SPOKE WITH SISTER CATH SCHEDULED IN MAIN LAB FOR 02/03/10 AT  10:00 PT TO BE AT SHORT STAY AT 8:00 WILL LEAVE INSTRUCTIONS WORK EXCUSE AT FRONT DESK FOR PT TO PICK UP ON FRI 01/28/10. Initial call taken by: Scherrie Bateman, LPN,  January 27, 2010 2:22 PM

## 2010-09-08 ENCOUNTER — Encounter: Payer: Self-pay | Admitting: Cardiovascular Disease

## 2010-09-08 ENCOUNTER — Ambulatory Visit (INDEPENDENT_AMBULATORY_CARE_PROVIDER_SITE_OTHER): Payer: PRIVATE HEALTH INSURANCE | Admitting: Cardiovascular Disease

## 2010-09-08 DIAGNOSIS — I472 Ventricular tachycardia: Secondary | ICD-10-CM

## 2010-09-08 DIAGNOSIS — R0602 Shortness of breath: Secondary | ICD-10-CM

## 2010-09-08 DIAGNOSIS — I10 Essential (primary) hypertension: Secondary | ICD-10-CM

## 2010-09-13 NOTE — Assessment & Plan Note (Signed)
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 7/21.  Abnormal myovue with 5 bts of polymorphic VT.  Cath was normal with normal EF also.  Since cath continues to have some SSCP.  Worse in recumbency at [facility] ? reflux. Compliant with BP meds.  ECG with normal QT  400 with bifid T's latterally Wanted to do cardiac MRI  to R/O infiltrative disease but patient to claustraphobic. Has had abnromal interstitial lung exam  F/U CT showed  Mild diffuse bronchial wall thickening may be infectious or inflammatory in etiology.  No underlying changes of UIP.  No pulmonary parenchymal nodule or mass.  Thinks his lungs "shutdown" at night after he comes home from work.  He is exposed to "chemicals" at work.  Denies history of asthma.  Saw Dr Shelle Iron and PFT;s with small airway disease but mild.  Agree that may be an environmental issue at work or home.    Will order CPX and see what Vo38max is and if there is a cardiopulmnary limitatoins    Current Problems (verified): 1)  Paroxysmal Ventricular Tachycardia  (ICD-427.1) 2)  Hypertension  (ICD-401.9) 3)  Shortness of Breath  (ICD-786.05) 4)  Chest Pain  (ICD-786.50)  Current Medications (verified): 1)  Albuterol Sulfate (5 Mg/ml) 0.5% Nebu (Albuterol Sulfate) .... As Needed 2)  Norvasc 10 Mg Tabs (Amlodipine Besylate) .... Take One Tablet By Mouth Daily.  Allergies (verified): No Known Drug Allergies  Past History:  Past Medical History: Last updated: 06/24/2010  HYPERTENSION (ICD-401.9) SHORTNESS OF BREATH (ICD-786.05) CHEST PAIN (ICD-786.50) --negative cath 2011  Past Surgical History: Last updated: 06/24/2010 none  Family History: Last updated: 06/24/2010 no known family history  Social History: Last updated: 06/24/2010 no drug abuse, occasional drinker occupation: works for [employer] never smoker single  Review of Systems   Denies fever, malais, weight loss, blurry vision, decreased visual acuity, cough, sputum, , hemoptysis, pleuritic pain, palpitaitons, heartburn, abdominal pain, melena, lower extremity edema, claudication, or rash.   Vital Signs:  Patient profile:   60 year old male Weight:      238 pounds Pulse rate:   70 / minute Pulse rhythm:   regular BP sitting:   118 / 80  (left arm) Cuff size:   regular  Vitals Entered By: Deliah Goody, RN (September 08, 2010 2:27 PM)  Physical Exam  General:  Affect appropriate Healthy:  appears stated age HEENT: normal Neck supple with no adenopathy JVP normal no bruits no thyromegaly Lungs upper airway rhonchi  no wheezing and good diaphragmatic motion Heart:  S1/S2 no murmur,rub, gallop or click PMI normal Abdomen: benighn, BS positve, no tenderness, no AAA no bruit.  No HSM or HJR Distal pulses intact with no bruits No edema Neuro non-focal Skin warm and dry    Impression & Recommendations:  Problem # 1:  PAROXYSMAL VENTRICULAR TACHYCARDIA (ICD-427.1) Resolved.  No CAD normal EF and QT ok His updated medication list for this problem includes:    Norvasc 10 Mg Tabs (Amlodipine besylate) .Marland Kitchen... Take one tablet by mouth daily.  Problem # 2:  HYPERTENSION (ICD-401.9) Improved with norvasc His updated medication list for this problem includes:    Norvasc 10 Mg Tabs (Amlodipine besylate) .Marland Kitchen... Take one tablet by mouth daily.  Problem # 3:  SHORTNESS OF BREATH (ICD-786.05) CPX and F/U pulmonary  No improvement with Dexelant His updated medication list for this problem includes:  Norvasc 10 Mg Tabs (Amlodipine besylate) .Marland Kitchen... Take one tablet by mouth daily.  Orders: CPX Test at [facility] (CPX Test)  Patient Instructions: 1)  Your physician wants you to follow-up in: 6 MONTHS  You will receive a reminder letter in the mail two months in advance. If you don't receive a letter, please call our office to schedule the follow-up  appointment. 2)  You have been referred to FOLLOW UP WITH DR [facility] IN 8 WEEKS 3)  Your physician has recommended that you have a cardiopulmonary stress test (CPX).  CPX testing is a non-invasive measurement of heart and lung function. It replaces a traditional treadmill stress test. This type of test provides a tremendous amount of information that relates not only to your present condition but also for future outcomes.  This test combines measurements of your ventilation, respiratory gas exchange in the lungs, electrocardiogram (EKG), blood pressure and physical response before, during, and following an exercise protocol.

## 2010-09-22 ENCOUNTER — Encounter (HOSPITAL_COMMUNITY): Payer: PRIVATE HEALTH INSURANCE

## 2010-09-30 LAB — CREATININE, SERUM: GFR calc non Af Amer: >60 mL/min (ref >60)

## 2010-10-01 LAB — POCT I-STAT, CHEM 8
BUN: 12 mg/dL (ref 6–23)
Chloride: 103 mEq/L (ref 96–112)
Sodium: 139 mEq/L (ref 135–145)
TCO2: 26 mmol/L (ref 0–100)

## 2010-10-05 LAB — DIFFERENTIAL
Basophils Absolute: 0 10*3/uL (ref 0.0–0.1)
Eosinophils Relative: 11 % — ABNORMAL HIGH (ref 0–5)
Lymphocytes Relative: 29 % (ref 12–46)
Monocytes Absolute: 0.4 10*3/uL (ref 0.1–1.0)

## 2010-10-05 LAB — CBC
MCV: 85.5 fL (ref 78.0–100.0)
RBC: 4.84 MIL/uL (ref 4.22–5.81)
WBC: 3.7 10*3/uL — ABNORMAL LOW (ref 4.0–10.5)

## 2010-10-05 LAB — BASIC METABOLIC PANEL
Chloride: 104 mEq/L (ref 96–112)
Creatinine, Ser: 0.98 mg/dL (ref 0.4–1.5)
GFR calc Af Amer: >60 mL/min (ref >60)
Potassium: 3.7 mEq/L (ref 3.5–5.1)

## 2010-10-05 LAB — BRAIN NATRIURETIC PEPTIDE: Pro B Natriuretic peptide (BNP): <30 pg/mL (ref 0.0–100.0)

## 2010-10-05 LAB — POCT CARDIAC MARKERS: Troponin i, poc: <0.05 ng/mL (ref 0.00–0.09)

## 2010-10-10 ENCOUNTER — Emergency Department (HOSPITAL_COMMUNITY)
Admission: EM | Admit: 2010-10-10 | Discharge: 2010-10-10 | Disposition: A | Discharge status: Home / Self Care | Payer: No Typology Code available for payment source | Attending: Emergency Medicine | Admitting: Emergency Medicine

## 2010-10-10 DIAGNOSIS — I10 Essential (primary) hypertension: Secondary | ICD-10-CM | POA: Insufficient documentation

## 2010-10-10 DIAGNOSIS — H9209 Otalgia, unspecified ear: Secondary | ICD-10-CM | POA: Insufficient documentation

## 2010-10-10 DIAGNOSIS — Z79899 Other long term (current) drug therapy: Secondary | ICD-10-CM | POA: Insufficient documentation

## 2010-10-10 DIAGNOSIS — H6120 Impacted cerumen, unspecified ear: Secondary | ICD-10-CM | POA: Insufficient documentation

## 2010-11-04 ENCOUNTER — Institutional Professional Consult (permissible substitution): Payer: No Typology Code available for payment source | Admitting: Pulmonary Disease

## 2010-11-08 ENCOUNTER — Encounter: Payer: Self-pay | Admitting: Pulmonary Disease

## 2010-11-10 ENCOUNTER — Institutional Professional Consult (permissible substitution): Payer: No Typology Code available for payment source | Admitting: Pulmonary Disease

## 2010-12-02 ENCOUNTER — Encounter: Payer: Self-pay | Admitting: Physician Assistant

## 2010-12-02 ENCOUNTER — Ambulatory Visit (INDEPENDENT_AMBULATORY_CARE_PROVIDER_SITE_OTHER): Payer: No Typology Code available for payment source | Admitting: Physician Assistant

## 2010-12-02 DIAGNOSIS — R079 Chest pain, unspecified: Secondary | ICD-10-CM

## 2010-12-02 DIAGNOSIS — I10 Essential (primary) hypertension: Secondary | ICD-10-CM

## 2010-12-02 LAB — BASIC METABOLIC PANEL
BUN: 13 mg/dL (ref 6–23)
CO2: 29 mEq/L (ref 19–32)
Calcium: 9.4 mg/dL (ref 8.4–10.5)
Chloride: 101 mEq/L (ref 96–112)
Creatinine, Ser: 0.8 mg/dL (ref 0.4–1.5)
GFR: 127.79 mL/min (ref >60.00)
Glucose, Bld: 85 mg/dL (ref 70–99)
Potassium: 3.9 mEq/L (ref 3.5–5.1)
Sodium: 137 mEq/L (ref 135–145)

## 2010-12-02 MED ORDER — LISINOPRIL 10 MG PO TABS: 10.0000 mg | ORAL_TABLET | Freq: Every day | ORAL | Status: DC

## 2010-12-02 NOTE — Patient Instructions (Addendum)
Your physician recommends that you schedule a follow-up appointment in: 12/16/10 @ 11am with SCOTT WEAVER, PA-C FOR A BLOOD PRESSURE CHECK.  Your physician recommends that you return for lab work in: TODAY BMET 401.9  Your physician recommends that you return for lab work in: 12/09/10 NEXT Friday FOR REPEAT BLOOD WORK BMET 401.9 COME IN ANYTIME FROM 8:30 AM TO 4 pm.  Your physician has recommended you make the following change in your medication: TAKE TYLENOL EXTRA STRENGTH 500 MG, 1-2 TABLETS EVERY 6 HOURS ONLY AS NEEDED FOR PAIN. START  LISINOPRIL 10 MG 1 TABLET DAILY. YOU HAVE BEEN GIVEN A PRESCRIPTION TODAY  APPLY HEAT TO THE CHEST AREA TWICE DAILY AS NEEDED.

## 2010-12-02 NOTE — Progress Notes (Signed)
History of Present Illness: Primary Cardiologist:  Dr. Charlton Haws  Benjamin Hamilton is a 60 y.o. male With a history of hypertension and chest pain.  He had a false-positive stress test last year.  Cardiac catheterization demonstrated some plaque in the distal LAD.  Otherwise, he had normal coronaries and normal LV function.  He's had some shortness of breath and has been evaluated by pulmonology.  He's had normal pulmonary function tests.  There is a question whether he had bilateral asthma.  An MRI has been requested by Dr. Eden Emms to rule out infiltrative cardiomyopathy.  The patient could not do this due to claustrophobia.  He was to be set up with a cardiopulmonary stress test.  This has not been done.  I believe that Dr. Stann Mainland wanted to do a methacholine challenge.  I do not believe this is been done.  She was placed on a PPI to see if this would help his chest pain.  The notes indicate that he had no improvement.  He presents as a walk-in today with chest pain.  This has been going on for a month.  As the same chest pain he has had in the past.  He develops chest discomfort when he lifts heavy objects between 304 100 pounds.  He does is constantly at work.  He can walk up and down steps as well feels without significant chest pain.  He has mild shortness of breath.  This is actually gotten better over the last year.  He denies orthopnea, PND or edema.  He does have some wheezing.  This is unchanged.  He denies syncope.  He denies cough.  He denies hemoptysis.  He denies pleuritic chest pain.  He denies dysphagia or odynophagia.  Past Medical History  Diagnosis Date  . Hypertension   . SOB (shortness of breath)     a. PFTs ok; b. Chest CT 11/11 with bronchial wall thickening, no mass or parenchymal disease;  c. seen by Dr. Shelle Iron - ? environment asthma vs. GERD (PPI no help)  . Chest pain     a. cath 7/11: dLAD 30%, EF 55-60% (done after abnl Myoview with 5 bts polymorphic VT)    Current  Outpatient Prescriptions  Medication Sig Dispense Refill  . amLODipine (NORVASC) 10 MG tablet Take 10 mg by mouth daily.        Marland Kitchen DISCONTD: albuterol (PROVENTIL) (2.5 MG/3ML) 0.083% nebulizer solution Take 2.5 mg by nebulization every 6 (six) hours as needed.          Allergies: Allergies not on file  History  Substance Use Topics  . Smoking status: Never Smoker   . Smokeless tobacco: Never Used  . Alcohol Use: Yes     rare - "on weekends"    Family History  Problem Relation Age of Onset  . Asthma Mother   . Diabetes Mother   . Heart attack Father     ROS:  See HPI.  No fevers, chills, cough, melena, hematochezia, vomiting, diarrhea, hemoptysis.  All other systems reviewed and negative.  Vital Signs: BP 142/100  Pulse 65  Ht 6' (1.829 m)  Wt 246 lb 12.8 oz (111.948 kg)  BMI 33.47 kg/m2  PHYSICAL EXAM: Well nourished, well developed, in no acute distress HEENT: normal Neck: no JVD Vascular: No carotid bruits Endocrine: No thyromegaly Cardiac:  normal S1, S2; RRR; no murmur Lungs:  clear to auscultation bilaterally, no wheezing, rhonchi or rales Abd: soft, nontender, no hepatomegaly Ext: no edema Skin:  warm and dry Neuro:  CNs 2-12 intact, no focal abnormalities noted  EKG:  Sinus rhythm, heart rate 65, normal axis, nonspecific ST-T wave changes, no significant change when compared to prior tracings  ASSESSMENT AND PLAN:

## 2010-12-02 NOTE — Assessment & Plan Note (Signed)
This is atypical.  It seems to be musculoskeletal.  He is asked he tender on exam palpate his chest.  He's actually just asking for a work note to allow him to not lift heavy objects as this seems to exacerbate his symptoms.  With his negative cardiac workup in the past, I do not believe he needs further cardiovascular testing at this time.  I recommended heat to his chest as well as Tylenol as needed.  I given him a note to refrain from lifting over 20 pounds for one month to see if this helps.

## 2010-12-02 NOTE — Assessment & Plan Note (Signed)
This is uncontrolled.  Start lisinopril 10 mg a day.  Check a basic metabolic panel today and a repeat in one week.  He can see me back in 2-3 weeks for followup on his blood pressure.

## 2010-12-07 ENCOUNTER — Encounter: Payer: Self-pay | Admitting: *Deleted

## 2010-12-16 ENCOUNTER — Ambulatory Visit: Payer: No Typology Code available for payment source | Admitting: Physician Assistant

## 2010-12-24 ENCOUNTER — Emergency Department (HOSPITAL_COMMUNITY): Payer: No Typology Code available for payment source

## 2010-12-24 ENCOUNTER — Emergency Department (HOSPITAL_COMMUNITY)
Admission: EM | Admit: 2010-12-24 | Discharge: 2010-12-24 | Disposition: A | Discharge status: Home / Self Care | Payer: No Typology Code available for payment source | Attending: Emergency Medicine | Admitting: Emergency Medicine

## 2010-12-24 DIAGNOSIS — R062 Wheezing: Secondary | ICD-10-CM | POA: Insufficient documentation

## 2010-12-24 DIAGNOSIS — Z79899 Other long term (current) drug therapy: Secondary | ICD-10-CM | POA: Insufficient documentation

## 2010-12-24 DIAGNOSIS — I10 Essential (primary) hypertension: Secondary | ICD-10-CM | POA: Insufficient documentation

## 2010-12-24 DIAGNOSIS — R569 Unspecified convulsions: Secondary | ICD-10-CM | POA: Insufficient documentation

## 2010-12-24 LAB — CK TOTAL AND CKMB (NOT AT ARMC)
CK, MB: 9 ng/mL (ref 0.3–4.0)
Relative Index: 1.9 (ref 0.0–2.5)
Total CK: 465 U/L — ABNORMAL HIGH (ref 7–232)

## 2010-12-24 LAB — COMPREHENSIVE METABOLIC PANEL WITH GFR
ALT: 16 U/L (ref 0–53)
AST: 16 U/L (ref 0–37)
Albumin: 3.7 g/dL (ref 3.5–5.2)
Alkaline Phosphatase: 71 U/L (ref 39–117)
BUN: 13 mg/dL (ref 6–23)
CO2: 28 meq/L (ref 19–32)
Calcium: 8.6 mg/dL (ref 8.4–10.5)
Chloride: 100 meq/L (ref 96–112)
Creatinine, Ser: 0.89 mg/dL (ref 0.4–1.5)
GFR calc Af Amer: >60 mL/min
GFR calc non Af Amer: >60 mL/min
Glucose, Bld: 99 mg/dL (ref 70–99)
Potassium: 3.4 meq/L — ABNORMAL LOW (ref 3.5–5.1)
Sodium: 138 meq/L (ref 135–145)
Total Bilirubin: 0.4 mg/dL (ref 0.3–1.2)
Total Protein: 7.5 g/dL (ref 6.0–8.3)

## 2010-12-24 LAB — CBC
HCT: 39.9 % (ref 39.0–52.0)
Hemoglobin: 13.7 g/dL (ref 13.0–17.0)
MCH: 28.3 pg (ref 26.0–34.0)
MCHC: 34.3 g/dL (ref 30.0–36.0)
MCV: 82.4 fL (ref 78.0–100.0)
Platelets: 166 K/uL (ref 150–400)
RBC: 4.84 MIL/uL (ref 4.22–5.81)
RDW: 13.8 % (ref 11.5–15.5)
WBC: 4 K/uL (ref 4.0–10.5)

## 2010-12-24 LAB — URINALYSIS, ROUTINE W REFLEX MICROSCOPIC
Glucose, UA: NEGATIVE mg/dL
Hgb urine dipstick: NEGATIVE
Specific Gravity, Urine: 1.014 (ref 1.005–1.030)

## 2010-12-24 LAB — RAPID URINE DRUG SCREEN, HOSP PERFORMED
Amphetamines: NOT DETECTED
Barbiturates: NOT DETECTED
Benzodiazepines: NOT DETECTED
Cocaine: NOT DETECTED
Opiates: NOT DETECTED
Tetrahydrocannabinol: NOT DETECTED

## 2010-12-24 LAB — LIPASE, BLOOD: Lipase: 38 U/L (ref 11–59)

## 2010-12-24 LAB — TROPONIN I: Troponin I: <0.3 ng/mL

## 2010-12-24 LAB — ETHANOL: Alcohol, Ethyl (B): 13 mg/dL — ABNORMAL HIGH (ref 0–10)

## 2010-12-28 ENCOUNTER — Telehealth: Payer: Self-pay | Admitting: Cardiovascular Disease

## 2010-12-28 NOTE — Telephone Encounter (Signed)
See phone note

## 2010-12-28 NOTE — Telephone Encounter (Signed)
Pt needs a letter from Dr Eden Emms or Tereso Newcomer so that he may return to work. Where the pt is working now has a lot of chemicals and he cant work around them. Pt needs letter so that he may return to his regular position. Pt wants to pick up letter ASAP.

## 2011-01-02 ENCOUNTER — Telehealth: Payer: Self-pay | Admitting: *Deleted

## 2011-01-02 ENCOUNTER — Telehealth: Payer: Self-pay | Admitting: Physician Assistant

## 2011-01-02 NOTE — Telephone Encounter (Signed)
Walk In Pt Form" Pt Needs Dr's Release to go back to Work" sent to Carol/Scott  01/02/11/km

## 2011-01-02 NOTE — Telephone Encounter (Signed)
LM W/ANN PERRY WHO STATES SHE IS PT'S FIANC'E, ASKED FOR PTCB.

## 2011-01-04 NOTE — Telephone Encounter (Signed)
S/w pt who walked in yesterday asking for his letter. I explained to him that he missed his 12/16/10 appt to have a BP check and was then seen in the ED for seizures and as per [facility] DC pt was supposed to follow up with [facility] for seizures. Dr. Eden Emms and Tereso Newcomer, PA-C state that pt needs to be released back to work from Neurology now and not cardiology. Pt was told he had to make an appt to see [facility]. Danielle Rankin

## 2011-04-14 ENCOUNTER — Other Ambulatory Visit: Payer: Self-pay | Admitting: *Deleted

## 2011-04-14 MED ORDER — AMLODIPINE BESYLATE 10 MG PO TABS: 10.0000 mg | ORAL_TABLET | Freq: Every day | ORAL | Status: DC

## 2011-04-21 ENCOUNTER — Ambulatory Visit (INDEPENDENT_AMBULATORY_CARE_PROVIDER_SITE_OTHER): Payer: No Typology Code available for payment source | Admitting: Physician Assistant

## 2011-04-21 ENCOUNTER — Encounter: Payer: Self-pay | Admitting: Physician Assistant

## 2011-04-21 DIAGNOSIS — I10 Essential (primary) hypertension: Secondary | ICD-10-CM

## 2011-04-21 DIAGNOSIS — R0602 Shortness of breath: Secondary | ICD-10-CM

## 2011-04-21 LAB — BASIC METABOLIC PANEL
Calcium: 9.4 mg/dL (ref 8.4–10.5)
Creatinine, Ser: 0.9 mg/dL (ref 0.4–1.5)
GFR: 107.27 mL/min (ref >60.00)
Glucose, Bld: 94 mg/dL (ref 70–99)
Sodium: 137 mEq/L (ref 135–145)

## 2011-04-21 MED ORDER — ALBUTEROL SULFATE HFA 108 (90 BASE) MCG/ACT IN AERS: 2.0000 | INHALATION_SPRAY | RESPIRATORY_TRACT | Status: DC | PRN

## 2011-04-21 MED ORDER — PANTOPRAZOLE SODIUM 40 MG PO TBEC: 40.0000 mg | DELAYED_RELEASE_TABLET | Freq: Every day | ORAL | Status: DC

## 2011-04-21 MED ORDER — LOSARTAN POTASSIUM 50 MG PO TABS: 50.0000 mg | ORAL_TABLET | Freq: Every day | ORAL | Status: DC

## 2011-04-21 NOTE — Progress Notes (Signed)
History of Present Illness: Primary Cardiologist:  Dr. Charlton Haws  Benjamin Hamilton is a 60 y.o. male with a history of hypertension and chest pain.  He had a false-positive stress test last year.  Cardiac catheterization demonstrated some plaque in the distal LAD.  Otherwise, he had normal coronaries and normal LV function.  He has seen Dr. Shelle Iron for dyspnea.  He had normal pulmonary function tests.  There is a question of occupational asthma.  An MRI had been requested by Dr. Eden Emms to rule out infiltrative cardiomyopathy but the patient could not do it due to claustrophobia.  He was to be set up with a cardiopulmonary stress test, but did not do it.  Dr. Shelle Iron wanted to do a methacholine challenge, but he did not do it.  I saw him several months ago as a walk in for chest pain.  He had chest wall pain.  I put him on Lisinopril for BP.  He never took it.  He went to the ED with questionable seizures in June.  He has seen neurology and was told he did not have seizures.    He presents today due to continued dyspnea.  Notes symptoms mainly at HS.  Worse with lying down.  Notes cough and wheezing.  Sputum is white.  No fever.  Girlfriend states he snores.  Never been tested for sleep apnea.  No chest pain.  Notes minimal dyspnea with activity.  May note dyspnea sometimes at work.  Describes NYHA class 2.  No orthopnea or edema.  Does awaken in middle of night short of breath and gets better with proventil inhaler.  No syncope.    Past Medical History  Diagnosis Date  . Hypertension   . SOB (shortness of breath)     a. PFTs ok; b. Chest CT 11/11 with bronchial wall thickening, no mass or parenchymal disease;  c. seen by Dr. Shelle Iron - ? environment asthma vs. GERD (PPI no help)  . Chest pain     a. cath 7/11: dLAD 30%, EF 55-60% (done after abnl Myoview with 5 bts polymorphic VT)    Current Outpatient Prescriptions  Medication Sig Dispense Refill  . albuterol (PROVENTIL HFA;VENTOLIN HFA) 108 (90  BASE) MCG/ACT inhaler Inhale 2 puffs into the lungs every 4 (four) hours as needed.  1 Inhaler  3  . amLODipine (NORVASC) 10 MG tablet Take 1 tablet (10 mg total) by mouth daily.  30 tablet  6  . losartan (COZAAR) 50 MG tablet Take 1 tablet (50 mg total) by mouth daily.  30 tablet  6  . pantoprazole (PROTONIX) 40 MG tablet Take 1 tablet (40 mg total) by mouth daily.  30 tablet  3    Allergies: No Known Allergies   History  Substance Use Topics  . Smoking status: Never Smoker   . Smokeless tobacco: Never Used  . Alcohol Use: Yes     rare - "on weekends"    Family History  Problem Relation Age of Onset  . Asthma Mother   . Diabetes Mother   . Heart attack Father     ROS:  Please see the history of present illness.  Admits to belching.  No dysphagia.  No melena, hematochezia, weight loss.  All other systems reviewed and negative.   Vital Signs: BP 132/92  Pulse 60  Ht 6' (1.829 m)  Wt 236 lb 6.4 oz (107.23 kg)  BMI 32.06 kg/m2  PHYSICAL EXAM: Well nourished, well developed, in no acute distress  HEENT: normal Neck: no JVD Cardiac:  normal S1, S2; RRR; no murmur Lungs:  Decreased breath sounds, faint expiratory wheezes noted diffusely Abd: soft, nontender, no hepatomegaly Ext: no edema Skin: warm and dry Neuro:  CNs 2-12 intact, no focal abnormalities noted Psych: normal affect   EKG:  Sinus rhythm, heart rate 65, normal axis, TW inversions V4-6, no significant change when compared to prior tracings  ASSESSMENT AND PLAN:

## 2011-04-21 NOTE — Assessment & Plan Note (Signed)
Never took Lisinopril.  Start Cozaar to avoid ACE induced cough.  Get bmet in one week.

## 2011-04-21 NOTE — Assessment & Plan Note (Addendum)
He has wheezing on exam and seems to have some type of reactive airway disease.  Has symptoms suggestive of GERD.  I have refilled Proventil for him today.  He uses this at night with relief.  Start Protonix 40 mg QD.  He also snores and I question if he is awakening from apneic episodes.  I think he needs to return to Dr. Shelle Iron for follow up to assess further for asthma and, possibly, sleep apnea.  I will obtain an echo and BMET and BNP.

## 2011-04-21 NOTE — Patient Instructions (Signed)
Your physician recommends that you schedule a follow-up appointment in: 3 months with Dr Eden Emms or Tereso Newcomer, PA-c Your physician recommends that you return for lab work in: today and in 1 week Your physician has recommended you make the following change in your medication: START Losartan 50 mg daily and Protonix 40 mg daily  Your physician has requested that you have an echocardiogram. Echocardiography is a painless test that uses sound waves to create images of your heart. It provides your doctor with information about the size and shape of your heart and how well your heart's chambers and valves are working. This procedure takes approximately one hour. There are no restrictions for this procedure. You have been referred to to Primary Care  Your physician recommends that you schedule a follow-up appointment in: Please make an appoint with Dr Shelle Iron to follow up

## 2011-04-28 ENCOUNTER — Ambulatory Visit (HOSPITAL_COMMUNITY): Payer: No Typology Code available for payment source | Admitting: Radiology

## 2011-04-28 ENCOUNTER — Other Ambulatory Visit (INDEPENDENT_AMBULATORY_CARE_PROVIDER_SITE_OTHER): Payer: No Typology Code available for payment source | Admitting: *Deleted

## 2011-04-28 DIAGNOSIS — R0602 Shortness of breath: Secondary | ICD-10-CM

## 2011-04-28 LAB — BASIC METABOLIC PANEL
BUN: 18 mg/dL (ref 6–23)
Calcium: 8.9 mg/dL (ref 8.4–10.5)
Creatinine, Ser: 0.9 mg/dL (ref 0.4–1.5)
GFR: 105.94 mL/min (ref >60.00)
Potassium: 3.7 mEq/L (ref 3.5–5.1)

## 2011-05-05 ENCOUNTER — Ambulatory Visit: Payer: No Typology Code available for payment source | Admitting: Internal Medicine

## 2011-05-05 DIAGNOSIS — Z0289 Encounter for other administrative examinations: Secondary | ICD-10-CM

## 2011-05-19 ENCOUNTER — Encounter: Payer: Self-pay | Admitting: *Deleted

## 2011-05-19 ENCOUNTER — Ambulatory Visit (INDEPENDENT_AMBULATORY_CARE_PROVIDER_SITE_OTHER): Payer: No Typology Code available for payment source | Admitting: Pulmonary Disease

## 2011-05-19 ENCOUNTER — Encounter: Payer: Self-pay | Admitting: Pulmonary Disease

## 2011-05-19 VITALS — BP 104/72 | HR 73 | Temp 98.0°F | Ht 72.0 in | Wt 240.8 lb

## 2011-05-19 DIAGNOSIS — R0602 Shortness of breath: Secondary | ICD-10-CM

## 2011-05-19 NOTE — Progress Notes (Signed)
  Subjective:    Patient ID: Benjamin Hamilton, male    DOB: 1951-01-29, 60 y.o.   MRN: [id number]  HPI The patient comes in today for reevaluation of dyspnea.  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  There was also concern for possible reflux disease leading to reactive airways.  The patient was placed on b.i.d. Proton pump inhibitor, and scheduled for methacholine challenge testing.  Unfortunately, the patient never showed up for the methacholine challenge.  I have not seen him since that time.  He comes in today where he is having similar symptoms, however he has been able to leave the work area where he was around strong chemicals.  His shortness of breath occurs primarily at night, and he will awaken with cough that sometimes produces clear mucus.  He denies symptoms of significant reflux disease, and has a prescription currently for a proton pump inhibitor that he has not filled.  He also notes dyspnea after work as before, and walking around helps with his symptoms.   Review of Systems  Constitutional: Negative for fever and unexpected weight change.  HENT: Positive for congestion. Negative for ear pain, nosebleeds, sore throat, rhinorrhea, sneezing, trouble swallowing, dental problem, postnasal drip and sinus pressure.   Eyes: Negative for redness and itching.  Respiratory: Positive for shortness of breath and wheezing. Negative for cough and chest tightness.   Cardiovascular: Negative for palpitations and leg swelling.  Gastrointestinal: Negative for nausea and vomiting.  Genitourinary: Negative for dysuria.  Musculoskeletal: Negative for joint swelling.  Skin: Negative for rash.  Neurological: Negative for headaches.  Hematological: Does not bruise/bleed easily.  Psychiatric/Behavioral: Negative for dysphoric mood. The patient is not nervous/anxious.        Objective:   Physical Exam Ow male in nad Nose  without purulence or discharge noted Oropharynx clear Chest is totally clear to auscultation Cardiac exam with regular rate and rhythm, no rubs or gallops Lower extremities without edema, no cyanosis noted Alert and oriented, moves all 4 extremities.       Assessment & Plan:

## 2011-05-19 NOTE — Patient Instructions (Signed)
Will set up for methacholine testing to evaluate for asthma/reactive airways disease Fill prescription for acid reflux medication and start taking.

## 2011-05-19 NOTE — Assessment & Plan Note (Signed)
The patient has continued to have dyspnea that occurs primarily at night, and sometimes intermittently during the day.  At the last evaluation, I was concerned about reflux disease versus possible occupational asthma.  I suspect the same after this visit.  I think he needs to get on his proton pump inhibitor, and we'll schedule him again for methacholine challenge testing.  I will see him back after his test is done.

## 2011-05-26 ENCOUNTER — Other Ambulatory Visit (HOSPITAL_COMMUNITY): Payer: Self-pay | Admitting: Respiratory Therapy

## 2011-05-26 ENCOUNTER — Other Ambulatory Visit (HOSPITAL_COMMUNITY): Payer: Self-pay | Admitting: Radiology

## 2011-05-26 ENCOUNTER — Ambulatory Visit (HOSPITAL_COMMUNITY)
Admission: RE | Admit: 2011-05-26 | Discharge: 2011-05-26 | Disposition: A | Discharge status: Home / Self Care | Payer: No Typology Code available for payment source | Source: Ambulatory Visit | Attending: Pulmonary Disease | Admitting: Pulmonary Disease

## 2011-05-26 DIAGNOSIS — R0602 Shortness of breath: Secondary | ICD-10-CM | POA: Insufficient documentation

## 2011-05-26 LAB — PULMONARY FUNCTION TEST

## 2011-05-26 MED ORDER — METHACHOLINE 1 MG/ML NEB SOLN: 3.0000 mL | Freq: Once | RESPIRATORY_TRACT | Status: DC

## 2011-05-26 MED ORDER — METHACHOLINE 4 MG/ML NEB SOLN: 3.0000 mL | Freq: Once | RESPIRATORY_TRACT | Status: DC

## 2011-05-26 MED ORDER — METHACHOLINE 16 MG/ML NEB SOLN: 3.0000 mL | Freq: Once | RESPIRATORY_TRACT | Status: DC

## 2011-05-26 MED ORDER — METHACHOLINE 0.25 MG/ML NEB SOLN: 3.0000 mL | Freq: Once | RESPIRATORY_TRACT | Status: DC

## 2011-05-26 MED ORDER — ALBUTEROL SULFATE (5 MG/ML) 0.5% IN NEBU
2.5000 mg | INHALATION_SOLUTION | Freq: Once | RESPIRATORY_TRACT | Status: AC
  Administered 2011-05-26: 2.5 mg via RESPIRATORY_TRACT

## 2011-05-26 MED ORDER — SODIUM CHLORIDE 0.9 % IN NEBU
3.0000 mL | INHALATION_SOLUTION | Freq: Once | RESPIRATORY_TRACT | Status: AC
  Administered 2011-05-26: 3 mL via RESPIRATORY_TRACT

## 2011-05-26 MED ORDER — METHACHOLINE 0.0625 MG/ML NEB SOLN: 3.0000 mL | Freq: Once | RESPIRATORY_TRACT | Status: DC

## 2011-05-31 MED FILL — Methacholine Chloride Inhal For Soln 100 MG: RESPIRATORY_TRACT | Qty: 2 | Status: AC

## 2011-05-31 MED FILL — Methacholine Chloride Inhal For Soln 100 MG: RESPIRATORY_TRACT | Qty: 100 | Status: AC

## 2011-06-02 ENCOUNTER — Telehealth: Payer: Self-pay | Admitting: Pulmonary Disease

## 2011-06-02 NOTE — Telephone Encounter (Signed)
I spoke with pt wife and she is requesting pt's methacholine challenge results done 05/26/11. Wife is aware KC is currently out of the office but would like a call back next week. Please advise Dr. Shelle Iron, thanks

## 2011-06-05 NOTE — Telephone Encounter (Signed)
Called and spoke with Marcelino Duster at [employer] and requested MCT.  She is going to fax them to the triage fax #.  Awaiting fax.

## 2011-06-05 NOTE — Telephone Encounter (Signed)
Benjamin Hamilton, can we track these down?

## 2011-06-05 NOTE — Telephone Encounter (Signed)
Received fax and put in [facility] very important look at folder

## 2011-06-06 NOTE — Telephone Encounter (Signed)
Megan, please call this pt and let him know his testing was positive for asthma. Will need to start dulera 100/5  2 bid, rinse mouth. See if he can come by to pick up samples, and be shown how to use Needs to see me in 4 weeks.

## 2011-06-07 NOTE — Telephone Encounter (Signed)
I spoke with the pt sister and she states she will have the pt call back when he gets off work. Carron Curie, CMA

## 2011-06-07 NOTE — Telephone Encounter (Signed)
ATC pt at home #.  LMOM for pt TCB.  Also ATC pt on mobile #.  NA and no option to leave message.

## 2011-06-09 NOTE — Telephone Encounter (Signed)
Called and spoke with pt.  Informed him of results and KC's recs.  Pt states he can come by on Monday to get samples and teaching of inhaler.  Samples left on my desk for pt.  Pt also scheduled 4 week f/u appt for 07/21/11 at 3:45pm as pt needed a Friday appt.

## 2011-07-21 ENCOUNTER — Ambulatory Visit (INDEPENDENT_AMBULATORY_CARE_PROVIDER_SITE_OTHER): Payer: No Typology Code available for payment source | Admitting: Pulmonary Disease

## 2011-07-21 ENCOUNTER — Encounter: Payer: Self-pay | Admitting: Pulmonary Disease

## 2011-07-21 ENCOUNTER — Encounter: Payer: Self-pay | Admitting: *Deleted

## 2011-07-21 VITALS — BP 150/98 | HR 70 | Temp 98.3°F | Ht 72.0 in | Wt 248.6 lb

## 2011-07-21 DIAGNOSIS — J45909 Unspecified asthma, uncomplicated: Secondary | ICD-10-CM

## 2011-07-21 MED ORDER — MOMETASONE FURO-FORMOTEROL FUM 100-5 MCG/ACT IN AERO: 2.0000 | INHALATION_SPRAY | Freq: Two times a day (BID) | RESPIRATORY_TRACT | Status: DC

## 2011-07-21 NOTE — Progress Notes (Signed)
  Subjective:    Patient ID: Benjamin Hamilton, male    DOB: 1951/05/18, 61 y.o.   MRN: [id number]  HPI Patient comes in today for followup of his asthma.  The patient underwent a methacholine challenge test, which proved to be positive for reactive airways.  He was started on dulera, and comes in today for followup.  He has done very well on the inhaler, and states that his breathing is greatly improved.  He has had no significant cough or mucus production.   Review of Systems  Constitutional: Negative for fever and unexpected weight change.  HENT: Negative for ear pain, nosebleeds, congestion, sore throat, rhinorrhea, sneezing, trouble swallowing, dental problem, postnasal drip and sinus pressure.   Eyes: Negative for redness and itching.  Respiratory: Negative for cough, chest tightness, shortness of breath and wheezing.   Cardiovascular: Negative for palpitations and leg swelling.  Gastrointestinal: Negative for nausea and vomiting.  Genitourinary: Negative for dysuria.  Musculoskeletal: Negative for joint swelling.  Skin: Negative for rash.  Neurological: Negative for headaches.  Hematological: Does not bruise/bleed easily.  Psychiatric/Behavioral: Negative for dysphoric mood. The patient is not nervous/anxious.        Objective:   Physical Exam Overweight male in no acute distress Nose without purulence or discharge noted Chest with clear breath sounds throughout, no wheezes Heart exam with regular rate and rhythm Lower extremities without significant edema, no cyanosis noted Alert and oriented, moves all 4 extremities.       Assessment & Plan:

## 2011-07-21 NOTE — Assessment & Plan Note (Signed)
The patient was found to have reactive airways disease on his methacholine challenge test, and now has done very well since being on dulera.  I have asked him to continue with this, and reviewed the pathophysiology of asthma with him.  He understands that he needs to stay on maintenance therapy every single day no matter how good he feels.

## 2011-07-21 NOTE — Patient Instructions (Signed)
Stay on dulera am and pm, and keep your mouth rinsed well.  Let us know if this is not covered on your insurance. Use proair as needed only in the event you get into trouble.  Can use every 6hrs if needed. followup with me in 6mos.

## 2011-08-07 ENCOUNTER — Telehealth: Payer: Self-pay | Admitting: Pulmonary Disease

## 2011-08-07 NOTE — Telephone Encounter (Signed)
Spoke with [employer]. She states returning a call to [place] regarding FMLA forms. I gave her medical records (healthport) to call. She states nothing further needed.

## 2011-08-11 ENCOUNTER — Telehealth: Payer: Self-pay | Admitting: Physician Assistant

## 2011-08-11 NOTE — Telephone Encounter (Signed)
Pt came in Dropped off ROI & $25.00 payment for FMLA, I am sending this to Luster Landsberg Elease Hashimoto gone for the Day) Luster Landsberg is aware this will be coming over to her, she stated she will lock up until [employer]. 08/11/11/KM

## 2011-10-06 ENCOUNTER — Telehealth: Payer: Self-pay | Admitting: Pulmonary Disease

## 2011-10-06 NOTE — Telephone Encounter (Signed)
Spoke with sister as requested-unaware of which inhaler patient is needing but will speak with patient over the weekend and have him call us on Monday regarding this matter.

## 2011-10-10 NOTE — Telephone Encounter (Signed)
No documentation where anyone called the office back with name of inhaler needing refills.  LMOM TCB x1.

## 2011-10-11 NOTE — Telephone Encounter (Signed)
LMOMTCB

## 2011-10-12 NOTE — Telephone Encounter (Signed)
Per pt sister he was given 2 inhalers from "someone" she is not sure where he got them but nothing further is needed at this time. Carron Curie, CMA

## 2011-12-05 ENCOUNTER — Telehealth: Payer: Self-pay | Admitting: Pulmonary Disease

## 2011-12-05 MED ORDER — ALBUTEROL SULFATE HFA 108 (90 BASE) MCG/ACT IN AERS: 2.0000 | INHALATION_SPRAY | Freq: Four times a day (QID) | RESPIRATORY_TRACT | Status: DC | PRN

## 2011-12-05 NOTE — Telephone Encounter (Signed)
I spoke with pt and he needed his albuterol inhaler sent to [facility] on [address]. i advised pt will send rx and nothing further was needed

## 2012-01-19 ENCOUNTER — Telehealth: Payer: Self-pay | Admitting: Pulmonary Disease

## 2012-01-19 ENCOUNTER — Other Ambulatory Visit: Payer: Self-pay | Admitting: Pulmonary Disease

## 2012-01-19 ENCOUNTER — Encounter: Payer: Self-pay | Admitting: Pulmonary Disease

## 2012-01-19 ENCOUNTER — Ambulatory Visit (INDEPENDENT_AMBULATORY_CARE_PROVIDER_SITE_OTHER): Payer: No Typology Code available for payment source | Admitting: Pulmonary Disease

## 2012-01-19 VITALS — BP 122/78 | HR 75 | Temp 98.3°F | Ht 73.0 in | Wt 239.0 lb

## 2012-01-19 DIAGNOSIS — J45909 Unspecified asthma, uncomplicated: Secondary | ICD-10-CM

## 2012-01-19 MED ORDER — ALBUTEROL SULFATE HFA 108 (90 BASE) MCG/ACT IN AERS: 2.0000 | INHALATION_SPRAY | Freq: Four times a day (QID) | RESPIRATORY_TRACT | Status: DC | PRN

## 2012-01-19 NOTE — Patient Instructions (Signed)
Pt left without being seen.

## 2012-01-19 NOTE — Telephone Encounter (Signed)
Pt left room without being seen at last visit. Lawson Fiscal, please find out what happen, and if he wishes to reschedule.  If time is a big factor for him, would do better the first 2 apptm of the am and afternoon clinic.

## 2012-01-19 NOTE — Progress Notes (Signed)
  Subjective:    Patient ID: Benjamin Hamilton, male    DOB: 17-Dec-1950, 61 y.o.   MRN: [id number]  HPI Pt left without being seen.  We will call him to reschedule.    Review of Systems  Constitutional: Negative for fever and unexpected weight change.  HENT: Negative for ear pain, nosebleeds, congestion, sore throat, rhinorrhea, sneezing, trouble swallowing, dental problem, postnasal drip and sinus pressure.   Eyes: Negative for redness and itching.  Respiratory: Negative for cough, chest tightness, shortness of breath and wheezing.   Cardiovascular: Negative for palpitations and leg swelling.  Gastrointestinal: Negative for nausea and vomiting.  Genitourinary: Negative for dysuria.  Musculoskeletal: Negative for joint swelling.  Skin: Negative for rash.  Neurological: Negative for headaches.  Hematological: Does not bruise/bleed easily.  Psychiatric/Behavioral: Negative for dysphoric mood. The patient is not nervous/anxious.   All other systems reviewed and are negative.       Objective:   Physical Exam        Assessment & Plan:

## 2012-01-19 NOTE — Assessment & Plan Note (Signed)
Pt left without being seen.

## 2012-01-25 NOTE — Telephone Encounter (Signed)
LMOMTCB x1 for pt 

## 2012-02-02 NOTE — Telephone Encounter (Signed)
LMOMTCB x1 for pt 

## 2012-02-08 NOTE — Telephone Encounter (Signed)
LMOMTCB x1 for pt 

## 2012-02-12 NOTE — Telephone Encounter (Signed)
LMOMTCB x 1 

## 2012-02-13 NOTE — Telephone Encounter (Signed)
Patient returned my call and says he left the previous appt because he had to get to work. Pt has been rescheduled for Aug 9th @ 4:15pm.

## 2012-02-23 ENCOUNTER — Ambulatory Visit (INDEPENDENT_AMBULATORY_CARE_PROVIDER_SITE_OTHER): Payer: No Typology Code available for payment source | Admitting: Pulmonary Disease

## 2012-02-23 ENCOUNTER — Encounter: Payer: Self-pay | Admitting: Pulmonary Disease

## 2012-02-23 VITALS — BP 136/82 | HR 69 | Temp 98.3°F | Ht 73.0 in | Wt 235.0 lb

## 2012-02-23 DIAGNOSIS — J45909 Unspecified asthma, uncomplicated: Secondary | ICD-10-CM

## 2012-02-23 NOTE — Progress Notes (Signed)
  Subjective:    Patient ID: Benjamin Hamilton, male    DOB: [birth year], 61 y.o.   MRN: [id number]  HPI The patient comes in today for followup of his known asthma.  He was started on a LABA/ICS at the last visit, and salt great improvement in his symptoms with the medication.  He comes in today where he has discontinued this, and is using his albuterol 4 times a day schedule.  He feels that his asthma is under fairly good control unless he is exposed to pollutants on filters or when around strong chemicals.  Overall, he feels that his breathing is better.   Review of Systems  Constitutional: Negative for fever and unexpected weight change.  HENT: Negative for ear pain, nosebleeds, congestion, sore throat, rhinorrhea, sneezing, trouble swallowing, dental problem, postnasal drip and sinus pressure.   Eyes: Negative for redness and itching.  Respiratory: Positive for cough and shortness of breath. Negative for chest tightness and wheezing.   Cardiovascular: Negative for palpitations and leg swelling.  Gastrointestinal: Negative for nausea and vomiting.  Genitourinary: Negative for dysuria.  Musculoskeletal: Negative for joint swelling.  Skin: Negative for rash.  Neurological: Negative for headaches.  Hematological: Does not bruise/bleed easily.  Psychiatric/Behavioral: Negative for dysphoric mood. The patient is not nervous/anxious.        Objective:   Physical Exam Wd male in nad Nose without purulence or discharge Chest is totally clear, no wheezing Cor with rrr LE with mild edema, no cyanosis  Alert and oriented, moves all 4.        Assessment & Plan:

## 2012-02-23 NOTE — Patient Instructions (Addendum)
You need to be on a medication everyday for your asthma that controls the inflammation in your breathing passages.  Will put you back on dulera 100/5  2 puffs each am and pm everyday no matter what.  Rinse your mouth out well. Use albuterol for emergencies only.  If you are needing that inhaler more than twice a week, let me know. If doing well, followup with me in 6mos.

## 2012-02-23 NOTE — Assessment & Plan Note (Signed)
The pt has known asthma, and needs to be on a maintenance controller medication.  I have explained to him the role of airway inflammation again, and that albuterol is NOT a maintenance/anti-inflammatory medication.  Will start him back on dulera, but listed other meds that may be less expensive on his drug formulary.  He is to call me if he requires his rescue inhaler more than twice a week.

## 2012-03-04 ENCOUNTER — Telehealth: Payer: Self-pay | Admitting: Pulmonary Disease

## 2012-03-04 NOTE — Telephone Encounter (Signed)
Please note I attempted to have pt call Dr. Eden Emms to fill amlodipine, but pt insists that [facility] orders this med for him.  Benjamin Hamilton

## 2012-03-04 NOTE — Telephone Encounter (Signed)
LMTCB-[phone] as work number is system is wrong-I have updated the system. Looks as though Dr Eden Emms last filled the BP med for patient in 04-14-11; is patient thinking of another med that [facility] fills for him, other wise patient needs to get refill from Dr Eden Emms.

## 2012-03-06 NOTE — Telephone Encounter (Signed)
LMTCB on home and cell number. Work number stated subscriber not available. Carron Curie, CMA

## 2012-03-08 NOTE — Telephone Encounter (Signed)
Pt advsied to get refills through Dr. Eden Emms.Carron Curie, CMA

## 2012-04-05 ENCOUNTER — Telehealth: Payer: Self-pay | Admitting: Pulmonary Disease

## 2012-04-05 ENCOUNTER — Encounter: Payer: Self-pay | Admitting: *Deleted

## 2012-04-05 NOTE — Telephone Encounter (Signed)
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 Pt is required to have this letter by Monday at 6am to be able to work. As KC is out of the office until Monday at 8am I have spoken with SN who agreed to give patient this letter. Letter has been printed and signed; pt took letter with him. Will call office if anything else is needed.    Pt is currently using Dulera 2 puffs bid and using albuterol inhaler just as needed and will follow up with KC in 08-2012 as stated in august 2013 OV with KC.

## 2012-07-23 ENCOUNTER — Encounter (HOSPITAL_COMMUNITY): Payer: Self-pay | Admitting: Emergency Medicine

## 2012-07-23 ENCOUNTER — Emergency Department (HOSPITAL_COMMUNITY): Payer: No Typology Code available for payment source

## 2012-07-23 ENCOUNTER — Emergency Department (HOSPITAL_COMMUNITY)
Admission: EM | Admit: 2012-07-23 | Discharge: 2012-07-23 | Disposition: A | Discharge status: Home / Self Care | Payer: No Typology Code available for payment source | Attending: Emergency Medicine | Admitting: Emergency Medicine

## 2012-07-23 DIAGNOSIS — I10 Essential (primary) hypertension: Secondary | ICD-10-CM | POA: Insufficient documentation

## 2012-07-23 DIAGNOSIS — R0602 Shortness of breath: Secondary | ICD-10-CM | POA: Insufficient documentation

## 2012-07-23 DIAGNOSIS — R079 Chest pain, unspecified: Secondary | ICD-10-CM | POA: Insufficient documentation

## 2012-07-23 DIAGNOSIS — Y9389 Activity, other specified: Secondary | ICD-10-CM | POA: Insufficient documentation

## 2012-07-23 DIAGNOSIS — S4980XA Other specified injuries of shoulder and upper arm, unspecified arm, initial encounter: Secondary | ICD-10-CM | POA: Insufficient documentation

## 2012-07-23 DIAGNOSIS — IMO0002 Reserved for concepts with insufficient information to code with codable children: Secondary | ICD-10-CM | POA: Insufficient documentation

## 2012-07-23 DIAGNOSIS — Z79899 Other long term (current) drug therapy: Secondary | ICD-10-CM | POA: Insufficient documentation

## 2012-07-23 DIAGNOSIS — S46909A Unspecified injury of unspecified muscle, fascia and tendon at shoulder and upper arm level, unspecified arm, initial encounter: Secondary | ICD-10-CM | POA: Insufficient documentation

## 2012-07-23 NOTE — ED Provider Notes (Signed)
Medical screening examination/treatment/procedure(s) were performed by non-physician practitioner and as supervising physician I was immediately available for consultation/collaboration.  Thayden Lemire, MD 07/23/12 2359 
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
## 2012-08-09 ENCOUNTER — Telehealth: Payer: Self-pay | Admitting: Pulmonary Disease

## 2012-08-09 MED ORDER — MOMETASONE FURO-FORMOTEROL FUM 100-5 MCG/ACT IN AERO: 2.0000 | INHALATION_SPRAY | Freq: Two times a day (BID) | RESPIRATORY_TRACT | Status: DC

## 2012-08-09 NOTE — Telephone Encounter (Signed)
Pt is aware that I have sent his prescription to [facility].

## 2012-08-30 ENCOUNTER — Ambulatory Visit: Payer: No Typology Code available for payment source | Admitting: Pulmonary Disease

## 2012-09-18 ENCOUNTER — Ambulatory Visit: Payer: No Typology Code available for payment source | Admitting: Pulmonary Disease

## 2012-10-04 ENCOUNTER — Ambulatory Visit: Payer: No Typology Code available for payment source | Admitting: Pulmonary Disease

## 2012-10-19 ENCOUNTER — Emergency Department (HOSPITAL_COMMUNITY)
Admission: EM | Admit: 2012-10-19 | Discharge: 2012-10-19 | Disposition: A | Discharge status: Home / Self Care | Payer: No Typology Code available for payment source | Attending: Emergency Medicine | Admitting: Emergency Medicine

## 2012-10-19 ENCOUNTER — Encounter (HOSPITAL_COMMUNITY): Payer: Self-pay | Admitting: Family Medicine

## 2012-10-19 DIAGNOSIS — Y939 Activity, unspecified: Secondary | ICD-10-CM | POA: Insufficient documentation

## 2012-10-19 DIAGNOSIS — S91009A Unspecified open wound, unspecified ankle, initial encounter: Secondary | ICD-10-CM | POA: Insufficient documentation

## 2012-10-19 DIAGNOSIS — I872 Venous insufficiency (chronic) (peripheral): Secondary | ICD-10-CM | POA: Insufficient documentation

## 2012-10-19 DIAGNOSIS — S81009A Unspecified open wound, unspecified knee, initial encounter: Secondary | ICD-10-CM | POA: Insufficient documentation

## 2012-10-19 DIAGNOSIS — Y929 Unspecified place or not applicable: Secondary | ICD-10-CM | POA: Insufficient documentation

## 2012-10-19 DIAGNOSIS — R269 Unspecified abnormalities of gait and mobility: Secondary | ICD-10-CM | POA: Insufficient documentation

## 2012-10-19 DIAGNOSIS — I878 Other specified disorders of veins: Secondary | ICD-10-CM

## 2012-10-19 DIAGNOSIS — M79672 Pain in left foot: Secondary | ICD-10-CM

## 2012-10-19 DIAGNOSIS — M79609 Pain in unspecified limb: Secondary | ICD-10-CM | POA: Insufficient documentation

## 2012-10-19 DIAGNOSIS — S81801A Unspecified open wound, right lower leg, initial encounter: Secondary | ICD-10-CM

## 2012-10-19 DIAGNOSIS — W010XXA Fall on same level from slipping, tripping and stumbling without subsequent striking against object, initial encounter: Secondary | ICD-10-CM | POA: Insufficient documentation

## 2012-10-19 DIAGNOSIS — I10 Essential (primary) hypertension: Secondary | ICD-10-CM | POA: Insufficient documentation

## 2012-10-19 DIAGNOSIS — Z79899 Other long term (current) drug therapy: Secondary | ICD-10-CM | POA: Insufficient documentation

## 2012-10-19 MED ORDER — IBUPROFEN 600 MG PO TABS: 600.0000 mg | ORAL_TABLET | Freq: Four times a day (QID) | ORAL | Status: DC | PRN

## 2012-10-19 NOTE — ED Provider Notes (Signed)
History     CSN: [id number]  Arrival date & time 10/19/12  0828   First MD Initiated Contact with Patient 10/19/12 [phone]      Chief Complaint  Patient presents with  . Leg Pain    (Consider location/radiation/quality/duration/timing/severity/associated sxs/prior treatment) Patient is a 62 y.o. male presenting with leg pain. The history is provided by the patient. No language interpreter was used.  Leg Pain Associated symptoms: no fever   Pt c/o left heel pain and wound on right shin. Left heel pain stated 1-2wks ago.  Pt describes as sharp and throbbing in nature, believes his old shoes "wore out" so he plans to get new inserts for his boots.  Pt has not tried anything for the pain yet but states he would like something.  Denies trauma. Denies hx of gout.  Pt also c/o wound on right anterior tibia that he got 33mo ago after falling on some cement.  Pt states it does not hurt but has noticed some increased swelling in his right leg and concerned that the wound has not improved over time.  Has tried Vaseline and peroxide on wound.  Denies any drainage from the wound.  Denies fever, n/v/d. Denies calf pain or difficulty walking on right leg. Denies hx of DM or immunocompromise.   Past Medical History  Diagnosis Date  . Hypertension   . SOB (shortness of breath)     a. PFTs ok; b. Chest CT 11/11 with bronchial wall thickening, no mass or parenchymal disease;  c. seen by Dr. Shelle Iron - ? environment asthma vs. GERD (PPI no help)  . Chest pain     a. cath 7/11: dLAD 30%, EF 55-60% (done after abnl Myoview with 5 bts polymorphic VT)    History reviewed. No pertinent past surgical history.  Family History  Problem Relation Age of Onset  . Asthma Mother   . Diabetes Mother   . Heart attack Father     History  Substance Use Topics  . Smoking status: Never Smoker   . Smokeless tobacco: Never Used  . Alcohol Use: Yes     Comment: rare - "on weekends"      Review of Systems   Constitutional: Negative for fever and chills.  Gastrointestinal: Negative for nausea, vomiting and diarrhea.  Musculoskeletal: Positive for gait problem ( painful to walk on left heel ).  Skin: Positive for wound ( wound on right shin). Negative for rash.    Allergies  Review of patient's allergies indicates no known allergies.  Home Medications   Current Outpatient Rx  Name  Route  Sig  Dispense  Refill  . albuterol (PROAIR HFA) 108 (90 BASE) MCG/ACT inhaler   Inhalation   Inhale 2 puffs into the lungs every 6 (six) hours as needed.   1 Inhaler   5   . amLODipine (NORVASC) 10 MG tablet   Oral   Take 1 tablet (10 mg total) by mouth daily.   30 tablet   6   . ibuprofen (ADVIL,MOTRIN) 600 MG tablet   Oral   Take 1 tablet (600 mg total) by mouth every 6 (six) hours as needed for pain.   30 tablet   0   . mometasone-formoterol (DULERA) 100-5 MCG/ACT AERO   Inhalation   Inhale 2 puffs into the lungs 2 (two) times daily.   1 Inhaler   5     BP 165/98  Pulse 88  Temp(Src) 97.6 F (36.4 C) (Oral)  Resp 18  SpO2 97%  Physical Exam  Nursing note and vitals reviewed. Constitutional: He appears well-developed and well-nourished. No distress.  HENT:  Head: Normocephalic and atraumatic.  Eyes: Conjunctivae are normal.  Neck: Normal range of motion.  Cardiovascular: Normal rate and regular rhythm.   Pulmonary/Chest: Effort normal and breath sounds normal.  Abdominal: Soft. Bowel sounds are normal.  Musculoskeletal: Normal range of motion. He exhibits edema ( 1+ pitting edema leg leg to mid tibia. 1-2+ pitting edema in right lower leg to just mid tibia) and tenderness ( TTP below left heel).  Skin: Skin is warm and dry. He is not diaphoretic.  Hyperpigmentation of right lower leg.  Lower leg hair loss noted bilaterally. 1cm lesion over right anterior tibia with scab.  Non-bleeding, non-draining. Non-tender. No warmth, erythema or red streaking.     ED Course   Procedures (including critical care time)  Labs Reviewed - No data to display No results found.   1. Heel pain, left   2. Venous stasis   3. Wound of right leg, initial encounter       MDM  Left heel pain, likely due to heel spur.  No hx of trauma. No hx of gout.   Right leg wound: nonhealing lesion likely due to pt's venous stasis. No signs of infection.   Will have pt get orthotics for left heel spur pain and tx with antiinflammatories. Advised pt to continue basic wound care for right shin.     Will have pt f/u with [facility] to establish care with PCP.  Consider consultation with podiatry to have rx orthotic made.  Rx: ibuprofen.   Vitals: unremarkable. Discharged in stable condition.    Discussed pt with attending during ED encounter.     Junius Finner, PA-C 10/19/12 [phone]

## 2012-10-19 NOTE — ED Provider Notes (Signed)
62 year old male presented with pain in his left heel. He is also had a slowly healing lesion in his right shin 00 which occurred when he fell after slipping on some ice several months ago. His heel tenderness is consistent with pain from a heel spur and he is advised to use NSAIDs and consider consultation with podiatry to have a orthotic prescribed. His lesion of his right shin it is slowly healing because of venous stasis. He is also noted that one plus pitting edema. He is advised on a low-salt diet and will be given referral to [facility] to try and arrange for her PCP.  Medical screening examination/treatment/procedure(s) were conducted as a shared visit with non-physician practitioner(s) and myself.  I personally evaluated the patient during the encounter   Dione Booze, MD 10/19/12 [phone]
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
## 2012-10-30 ENCOUNTER — Telehealth: Payer: Self-pay | Admitting: Pulmonary Disease

## 2012-10-30 MED ORDER — AMLODIPINE BESYLATE 10 MG PO TABS: 10.0000 mg | ORAL_TABLET | Freq: Every day | ORAL | Status: DC

## 2012-10-30 NOTE — Telephone Encounter (Signed)
Spoke to pt's sister, Jasmine December (on HIPAA) I advised her that we can't fill his BP long term, he will need to find a PCP. She agreed and verbalized understanding. I have sent in a 30 day supply per [facility] until he can find PCP.

## 2012-10-30 NOTE — Telephone Encounter (Signed)
Pt called back again. He says he no longer sees dr Eden Emms and says dr clance told him that he [facility]) would fill bp meds for him. Hazel Sams

## 2012-10-30 NOTE — Telephone Encounter (Signed)
Are you willing to fill his BP meds. I don't see any documentation that you told the patient this. Please advise. Thanks.

## 2012-10-30 NOTE — Telephone Encounter (Signed)
NOTE: I LMOM for pt re: this rx. Looks like dr. Eden Emms last filled this not dr clance. I will leave this in triage for nurse to verify with pt. Since I was unable to reach him at work or home #. Hazel Sams

## 2012-10-30 NOTE — Telephone Encounter (Signed)
Let him know that I can't fill his BP meds.  He needs to get them from his primary md if he isn't seeing cardiology anymore.  If he does not have a primary, he must get one.  I may be able to help out for one month only IF HE IS GOING TO HAVE A PRIMARY MD>

## 2012-11-08 ENCOUNTER — Encounter: Payer: Self-pay | Admitting: Pulmonary Disease

## 2012-11-08 ENCOUNTER — Ambulatory Visit (INDEPENDENT_AMBULATORY_CARE_PROVIDER_SITE_OTHER): Payer: No Typology Code available for payment source | Admitting: Pulmonary Disease

## 2012-11-08 VITALS — BP 138/82 | HR 77 | Temp 99.1°F

## 2012-11-08 DIAGNOSIS — J45909 Unspecified asthma, uncomplicated: Secondary | ICD-10-CM

## 2012-11-08 NOTE — Assessment & Plan Note (Signed)
The patient does very well on the dulera as long as he stays on the med religiously.  I've asked him to continue with his maintenance inhaler, and have also stressed to him the importance of having a rescue inhaler available as well.  He will followup with me in one year if doing well

## 2012-11-08 NOTE — Patient Instructions (Addendum)
Stay on your dulera twice a day everyday. Keep a rescue inhaler handy.  Will give you a sample of albuterol.  Use 2 puffs up to every 6 hrs for emergencies only.  followup with me in one year if doing well.

## 2012-11-08 NOTE — Progress Notes (Signed)
  Subjective:    Patient ID: Benjamin Hamilton, male    DOB: 01-20-1951, 62 y.o.   MRN: [id number]  HPI The patient comes in today for followup of his asthma.  He does.  Well with no rescue inhaler need as long as he uses his delay or twice a day.  He denies any flareups or pulmonary infections since his last visit.  He has not had any significant cough or mucus production.   Review of Systems  Constitutional: Negative for fever and unexpected weight change.  HENT: Negative for ear pain, nosebleeds, congestion, sore throat, rhinorrhea, sneezing, trouble swallowing, dental problem, postnasal drip and sinus pressure.   Eyes: Negative for redness and itching.  Respiratory: Negative for cough, chest tightness, shortness of breath and wheezing.   Cardiovascular: Negative for palpitations and leg swelling.  Gastrointestinal: Negative for nausea and vomiting.  Genitourinary: Negative for dysuria.  Musculoskeletal: Positive for arthralgias ( ankle joint/heel pain). Negative for joint swelling.  Skin: Negative for rash.  Neurological: Negative for headaches.  Hematological: Does not bruise/bleed easily.  Psychiatric/Behavioral: Negative for dysphoric mood. The patient is not nervous/anxious.        Objective:   Physical Exam Overweight male in no acute distress Nose without purulence or discharge noted Neck without lymphadenopathy or thyromegaly Chest completely clear to auscultation, no wheezing Cardiac exam regular rate and rhythm Lower extremities without edema, cyanosis Alert and oriented, moves all 4 extremities.       Assessment & Plan:

## 2012-11-21 ENCOUNTER — Ambulatory Visit (INDEPENDENT_AMBULATORY_CARE_PROVIDER_SITE_OTHER): Payer: No Typology Code available for payment source | Admitting: Pulmonary Disease

## 2012-11-21 ENCOUNTER — Encounter: Payer: Self-pay | Admitting: Pulmonary Disease

## 2012-11-21 VITALS — BP 140/74 | HR 80 | Temp 98.2°F | Ht 73.0 in | Wt 249.4 lb

## 2012-11-21 DIAGNOSIS — J45909 Unspecified asthma, uncomplicated: Secondary | ICD-10-CM

## 2012-11-21 DIAGNOSIS — J309 Allergic rhinitis, unspecified: Secondary | ICD-10-CM | POA: Insufficient documentation

## 2012-11-21 MED ORDER — PREDNISONE 10 MG PO TABS: ORAL_TABLET | ORAL | Status: DC

## 2012-11-21 NOTE — Assessment & Plan Note (Signed)
The patient is clearly having a flare of allergic rhinitis, but it concerns me that it is affecting his breathing and causing increased rescue inhaler use.  I will treat him aggressively with a short course of prednisone, and will also start on a nasal spray for maintenance through the remainder of the allergy season.  He is to let me known if he is not improving, or if he begins to have purulence from his nose.

## 2012-11-21 NOTE — Assessment & Plan Note (Signed)
The patient is having a very mild flare of his asthma since his allergic rhinitis has been an issue.  Hopefully this will improve with treatment of his rhinitis symptoms.  If he does not, he is to call us for further input.

## 2012-11-21 NOTE — Patient Instructions (Addendum)
Will treat you with a short course of prednisone to help with your allergies. Start dymista nasal spray, one spray each nostril am and pm everyday for next 2 weeks.  If you feel this really helps your nasal symptoms, let us know and we can send in a prescription. Continue your asthma medications. Keep already scheduled followup.

## 2012-11-21 NOTE — Progress Notes (Signed)
  Subjective:    Patient ID: Benjamin Hamilton, male    DOB: 1951-01-08, 62 y.o.   MRN: [id number]  HPI The patient comes in today for an acute sick visit.  He has known significant asthma, but has done well since being on dulera.  He gives a one-week history of increasing nasal congestion, postnasal drip, as well as itchy eyes.  He is also seen some worsening of his breathing, and has been using his rescue inhaler more frequently.  He denies any chest congestion, and has not had purulent mucus from his nose.   Review of Systems  Constitutional: Negative for fever and unexpected weight change.  HENT: Positive for congestion, rhinorrhea, sneezing and postnasal drip. Negative for ear pain, nosebleeds, sore throat, trouble swallowing, dental problem and sinus pressure.   Eyes: Negative for redness and itching.  Respiratory: Positive for cough, shortness of breath and wheezing. Negative for chest tightness.   Cardiovascular: Negative for palpitations and leg swelling.  Gastrointestinal: Negative for nausea and vomiting.  Genitourinary: Negative for dysuria.  Musculoskeletal: Negative for joint swelling.  Skin: Negative for rash.  Neurological: Negative for headaches.  Hematological: Does not bruise/bleed easily.  Psychiatric/Behavioral: Negative for dysphoric mood. The patient is not nervous/anxious.        Objective:   Physical Exam Well-developed male in no acute distress Nose with erythematous mucosa and mild edema, but no purulence Oropharynx clear Neck without lymphadenopathy or thyromegaly Chest clear to auscultation with good breath sounds. Cardiac exam with regular rate and rhythm Lower extremities mild edema, no cyanosis Alert and oriented, moves all 4 extremities.       Assessment & Plan:

## 2012-11-22 IMAGING — CT CT HEAD W/O CM
1 of 4 series · 10 of 30 positions shown, 13 images · non-contrast
Comparison: None.

CLINICAL DATA: Seizures.

CT HEAD WITHOUT CONTRAST
TECHNIQUE: Contiguous axial images were obtained from the base of
the skull through the vertex without contrast.

[Series 2: brain · axial · 0.47mm/px · z∈[+102,+233]mm · 10 of 32 slices shown, 13 images]
[im 3/32  brain]
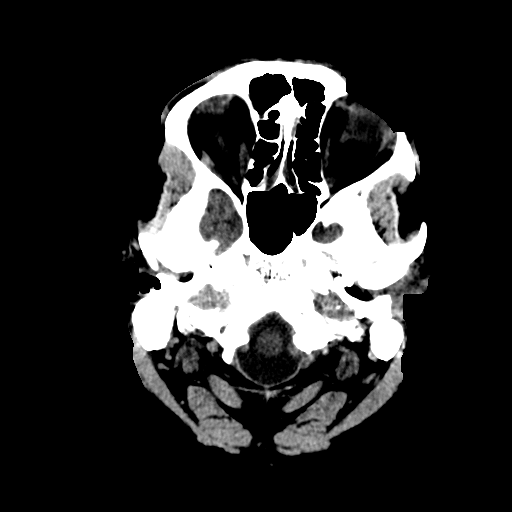
[im 3/32  bone]
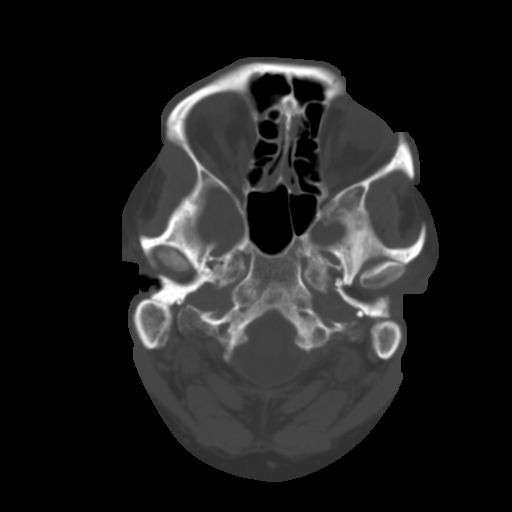
[im 6/32  brain]
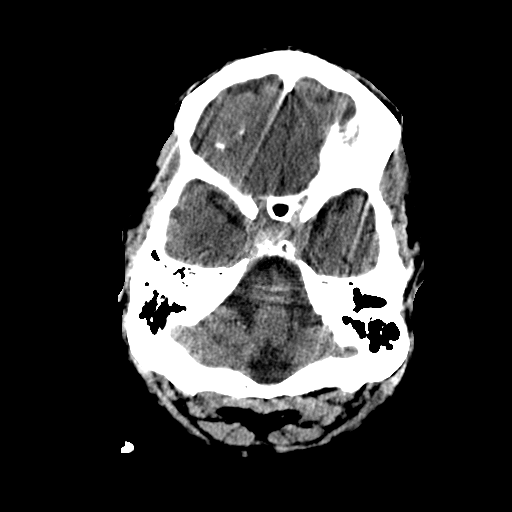
[im 9/32  brain]
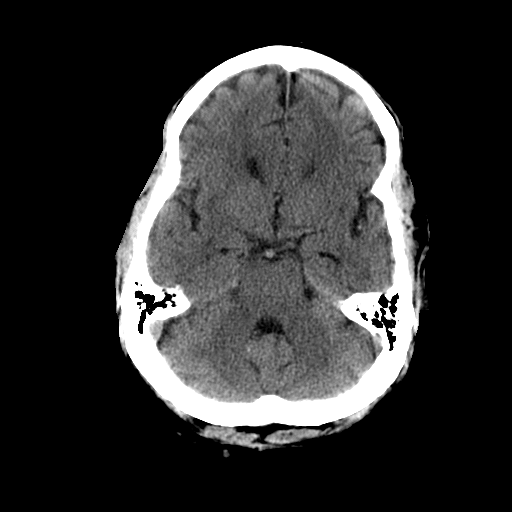
[im 12/32  brain]
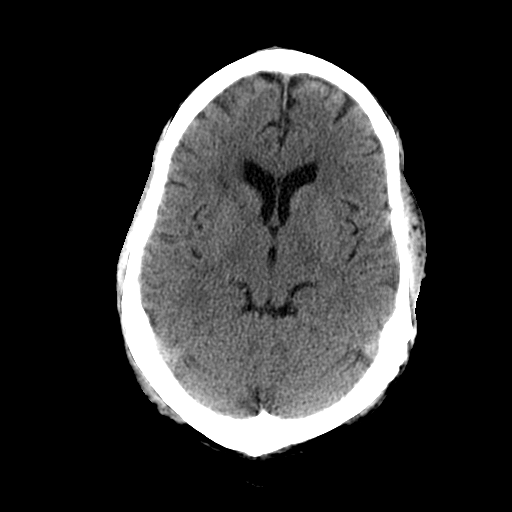
[im 15/32  brain]
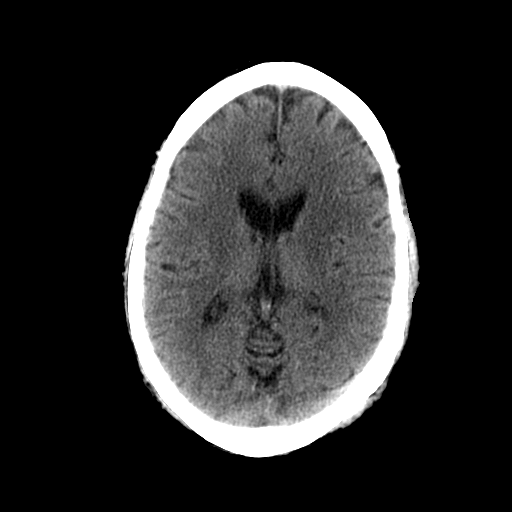
[im 15/32  bone]
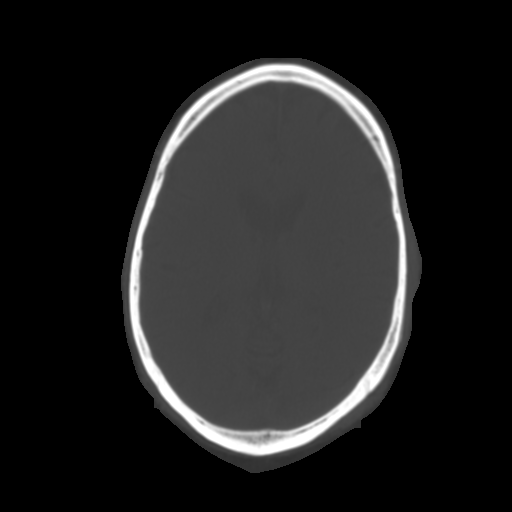
[im 17/32  brain]
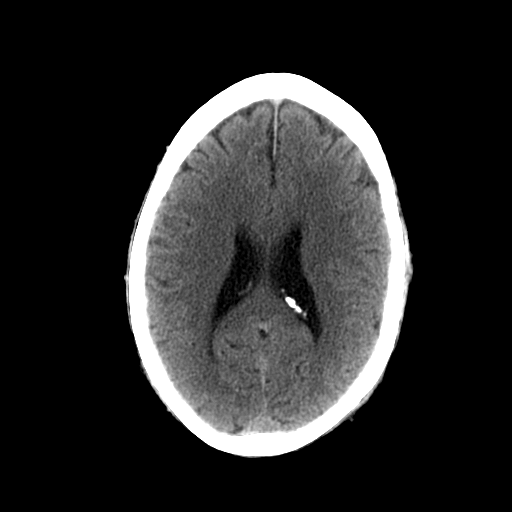
[im 20/32  brain]
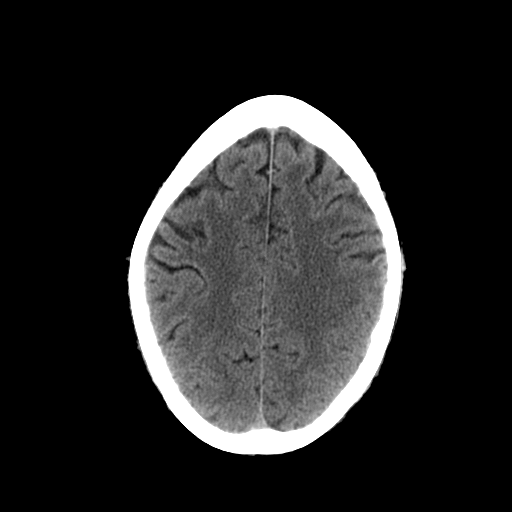
[im 23/32  brain]
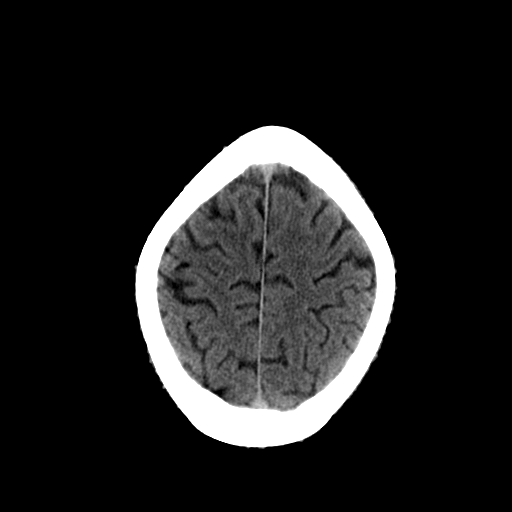
[im 26/32  brain]
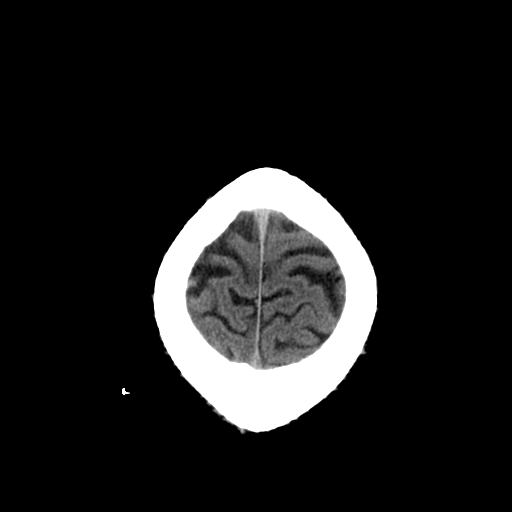
[im 26/32  bone]
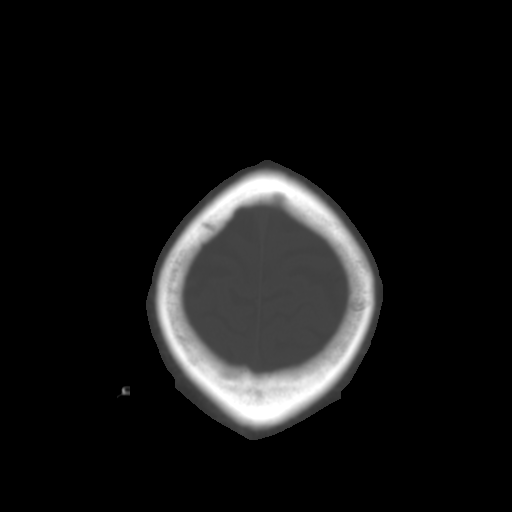
[im 29/32  brain]
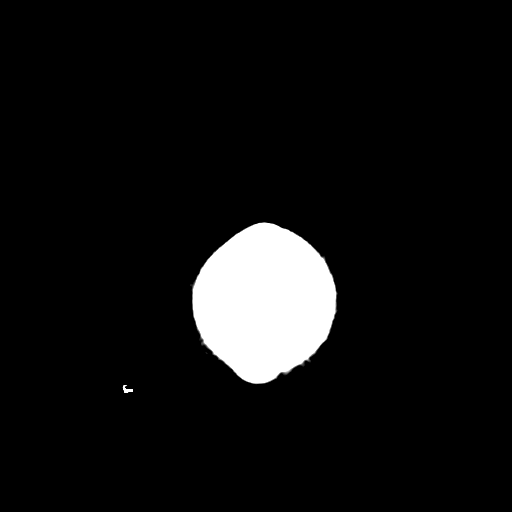

[10 of 30 positions shown; findings below may reference images not displayed]

FINDINGS: No evidence of acute infarct, acute hemorrhage, mass
lesion, mass effect or hydrocephalus.  There may be a remote
lacunar infarct in the right thalamus.  Minimal periventricular low
attenuation.  Scattered opacification and mucosal thickening are
seen primarily in the ethmoid air cells.  Mastoid air cells are
clear.
IMPRESSION: 1.  No acute intracranial abnormality.
2.  Minimal evidence of chronic microvascular white matter ischemic
changes.
3.  Scattered opacification of the ethmoid air cells.

## 2012-11-22 IMAGING — CR DG CHEST 2V
2 series · 2 of 2 positions shown · non-contrast
Comparison: 10/16/2009 and 05/27/2010

CLINICAL DATA: Seizure.  Cough for 1 week.

CHEST - 2 VIEW

[w chest pa]
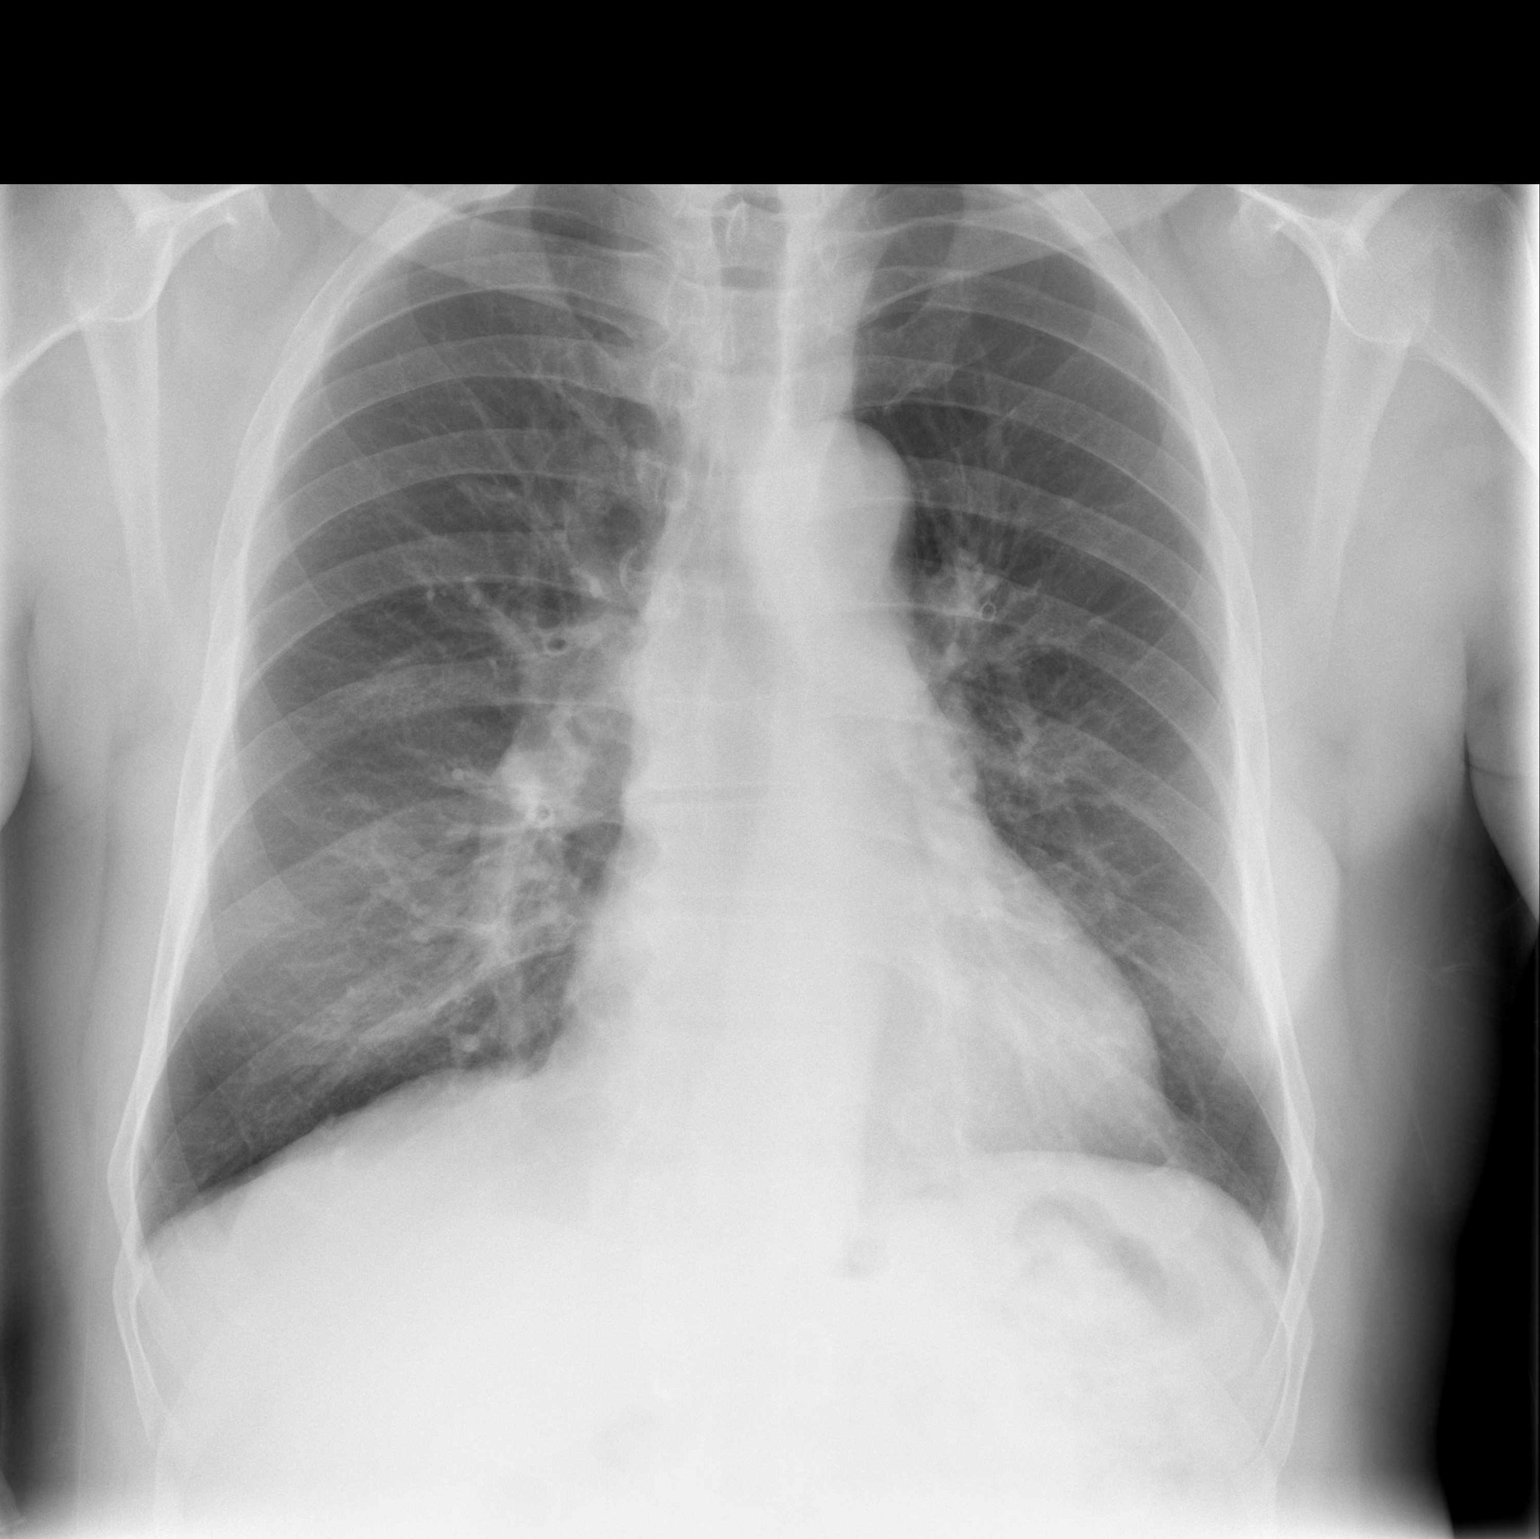

[w chest lat]
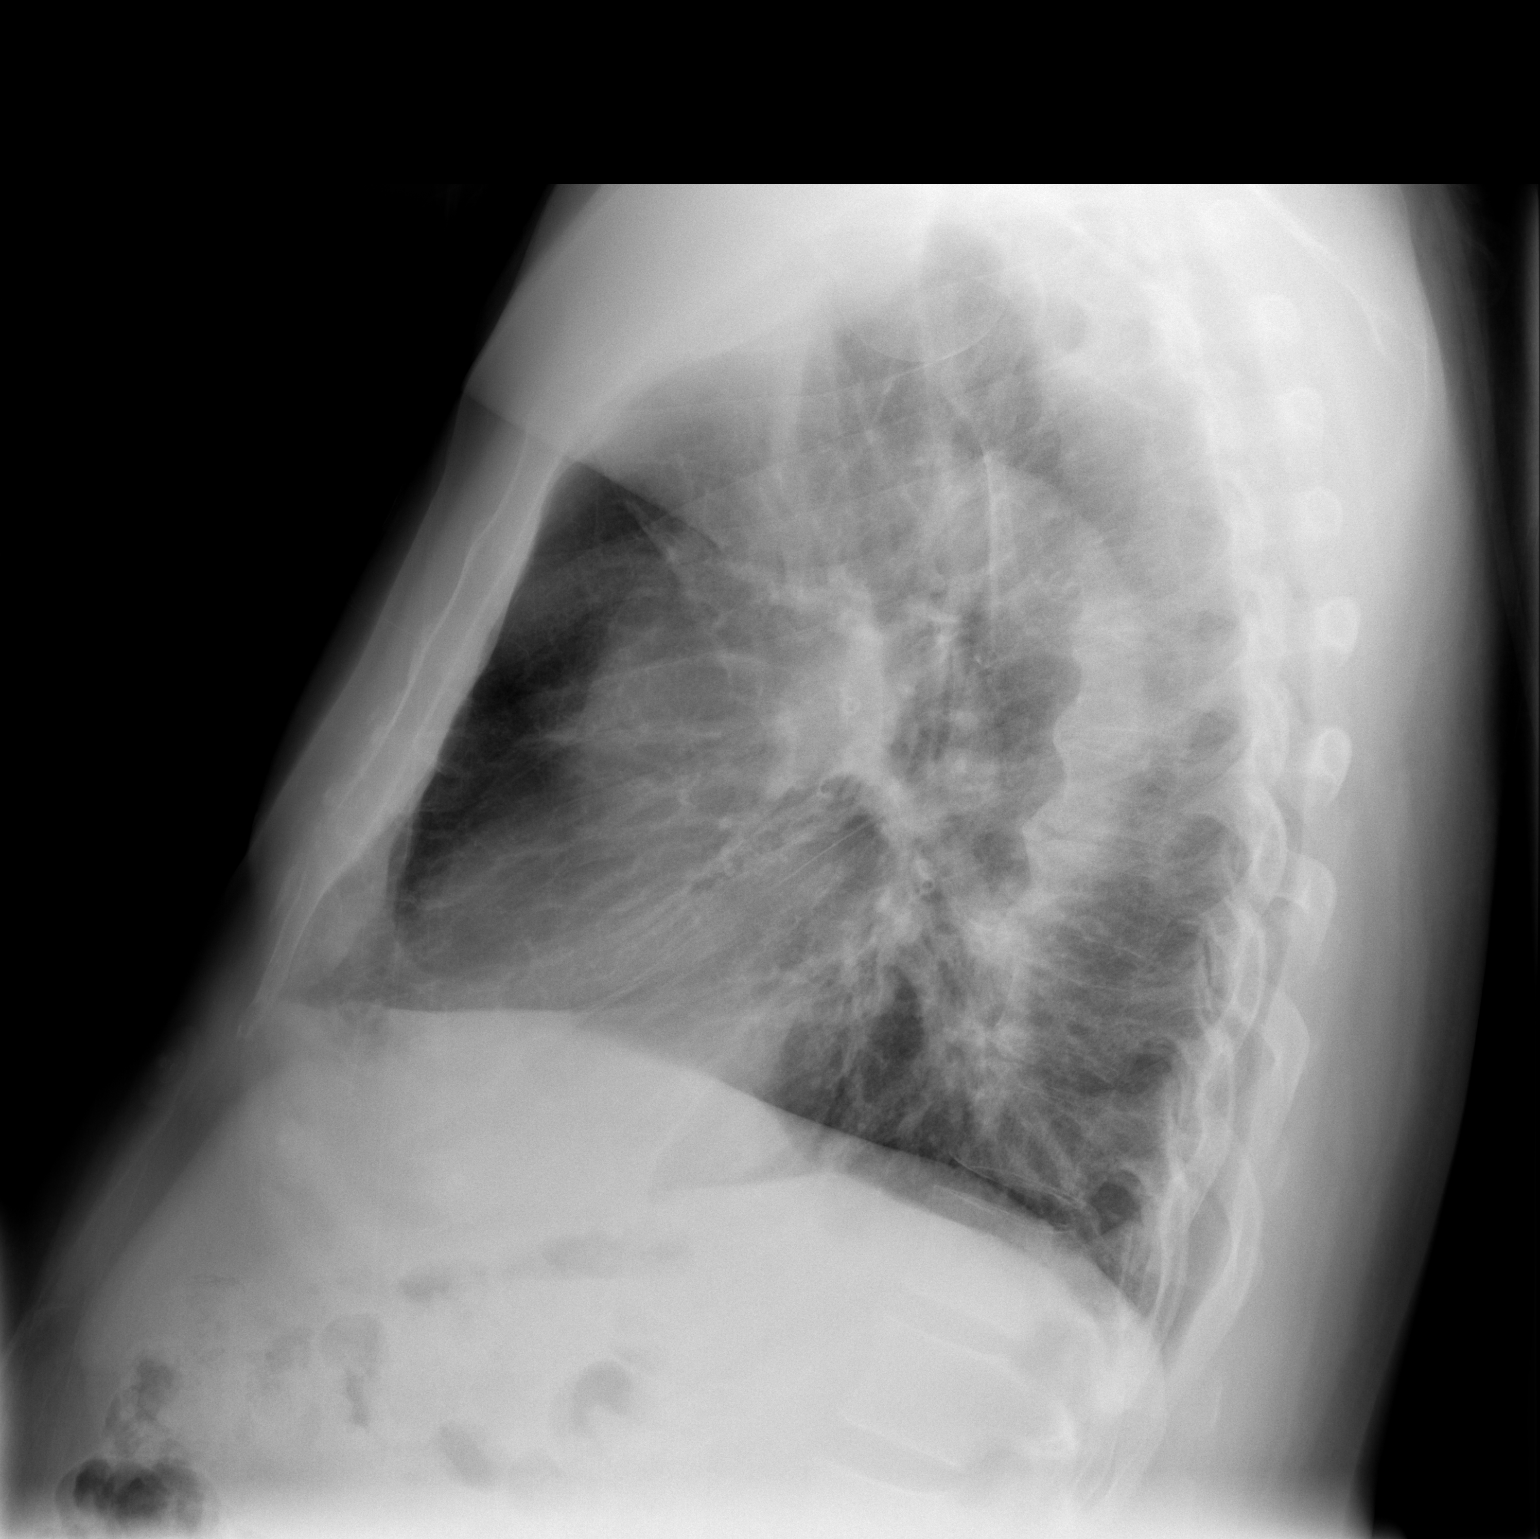

[2 of 2 positions shown; findings below may reference images not displayed]

FINDINGS: Two views of the chest were obtained.  Stable appearance
of the heart and mediastinum.  Trachea is midline.  Prominent lung
markings in the right lung base are similar to the prior
examination. There are slightly increased interstitial densities in
the right mid lung and right lung base.  Stable degenerative
changes in the thoracic spine.  No evidence for pleural effusion.
IMPRESSION: Slightly increased interstitial densities in the right mid and
lower lung region.  Some of these findings could be chronic.
Difficult to exclude an atypical infectious etiology or aspiration
changes.

## 2012-11-28 ENCOUNTER — Other Ambulatory Visit: Payer: Self-pay | Admitting: Pulmonary Disease

## 2012-11-29 ENCOUNTER — Telehealth: Payer: Self-pay | Admitting: Pulmonary Disease

## 2012-11-29 MED ORDER — AMLODIPINE BESYLATE 10 MG PO TABS: 10.0000 mg | ORAL_TABLET | Freq: Every day | ORAL | Status: DC

## 2012-11-29 NOTE — Telephone Encounter (Signed)
Called and spoke with pt and he is aware that rx has been sent in to the pharmacy.

## 2012-12-26 ENCOUNTER — Telehealth: Payer: Self-pay | Admitting: Pulmonary Disease

## 2012-12-26 NOTE — Telephone Encounter (Signed)
I spoke with pt and he is requesting a refill on his amlodipine 10 mg. Dr. Shelle Iron are you okay if we keep refilling this for pt. Please advise thanks

## 2012-12-26 NOTE — Telephone Encounter (Signed)
I called and made pt aware of recs. He voiced his understanding and will call his PCP. Nothing further was needed

## 2012-12-26 NOTE — Telephone Encounter (Signed)
This needs to be prescribed by his primary md.  I don't remember offering to fill this, but I certainly didn't tell the pt I would keep filling it for him.  If he does not have a primary md, needs to go to urgent care, or [facility] if it is a funding issue.

## 2013-01-02 ENCOUNTER — Other Ambulatory Visit: Payer: Self-pay | Admitting: Pulmonary Disease

## 2013-01-03 ENCOUNTER — Telehealth: Payer: Self-pay | Admitting: Pulmonary Disease

## 2013-01-03 MED ORDER — MOMETASONE FURO-FORMOTEROL FUM 100-5 MCG/ACT IN AERO: 2.0000 | INHALATION_SPRAY | Freq: Two times a day (BID) | RESPIRATORY_TRACT | Status: DC

## 2013-01-03 NOTE — Telephone Encounter (Signed)
Rx sent to [facility] Dulera x 9 refills  Pt requesting samples--we do not have any samples to give today. Pt aware

## 2013-01-24 ENCOUNTER — Other Ambulatory Visit: Payer: Self-pay | Admitting: *Deleted

## 2013-01-24 MED ORDER — AMLODIPINE BESYLATE 10 MG PO TABS: 10.0000 mg | ORAL_TABLET | Freq: Every day | ORAL | Status: DC

## 2013-02-14 ENCOUNTER — Telehealth: Payer: Self-pay | Admitting: *Deleted

## 2013-02-14 ENCOUNTER — Encounter: Payer: Self-pay | Admitting: Physician Assistant

## 2013-02-14 ENCOUNTER — Ambulatory Visit: Payer: No Typology Code available for payment source | Admitting: Internal Medicine

## 2013-02-14 ENCOUNTER — Encounter: Payer: Self-pay | Admitting: *Deleted

## 2013-02-14 ENCOUNTER — Ambulatory Visit (INDEPENDENT_AMBULATORY_CARE_PROVIDER_SITE_OTHER): Payer: No Typology Code available for payment source | Admitting: Physician Assistant

## 2013-02-14 VITALS — BP 154/90 | HR 70 | Ht 73.0 in | Wt 233.2 lb

## 2013-02-14 DIAGNOSIS — I10 Essential (primary) hypertension: Secondary | ICD-10-CM

## 2013-02-14 DIAGNOSIS — R351 Nocturia: Secondary | ICD-10-CM

## 2013-02-14 DIAGNOSIS — J45909 Unspecified asthma, uncomplicated: Secondary | ICD-10-CM

## 2013-02-14 DIAGNOSIS — I2510 Atherosclerotic heart disease of native coronary artery without angina pectoris: Secondary | ICD-10-CM

## 2013-02-14 DIAGNOSIS — Z0289 Encounter for other administrative examinations: Secondary | ICD-10-CM

## 2013-02-14 LAB — BASIC METABOLIC PANEL
CO2: 27 mEq/L (ref 19–32)
Chloride: 102 mEq/L (ref 96–112)
Sodium: 136 mEq/L (ref 135–145)

## 2013-02-14 MED ORDER — LOSARTAN POTASSIUM 50 MG PO TABS: 50.0000 mg | ORAL_TABLET | Freq: Every day | ORAL | Status: DC

## 2013-02-14 MED ORDER — ASPIRIN EC 81 MG PO TBEC: 81.0000 mg | DELAYED_RELEASE_TABLET | Freq: Every day | ORAL | Status: AC

## 2013-02-14 NOTE — Telephone Encounter (Signed)
advised pt ok per Bing Neighbors. PAC ok to wait until 8/8 to have the bmet that Dr. Tenny Craw stated to have done 02/18/13. pt said he w/cb on 8/4 to let me know if he can or cannot come in friday for lab

## 2013-02-14 NOTE — Progress Notes (Signed)
[address] [place], [place]  [address] Phone: [phone] Fax:  [phone]  Date:  02/14/2013   ID:  Benjamin Hamilton, DOB [birth year], MRN [id number]  PCP:  Default, Provider, MD  Cardiologist:  Dr. Charlton Haws     History of Present Illness: Benjamin Hamilton is a 62 y.o. male who returns for follow up.  He has a hx of HTN and chest pain. He had a false-positive stress test in 2011. LHC demonstrated some plaque in the distal LAD. Otherwise, he had normal coronaries and normal LV function. He is followed by Dr. Shelle Iron for asthma.  An MRI had been requested by Dr. Eden Emms in the past to rule out infiltrative cardiomyopathy but the patient could not do it due to claustrophobia. He was to be set up with a cardiopulmonary stress test, but did not do it.  Last seen in this office in 04/2011.  The patient denies chest pain, shortness of breath, syncope, orthopnea, PND or significant pedal edema.   Labs (6/12):    K 3.4, Cr 0.89, ALT 16, Hgb 13.7 Labs (10/12):  K 3.7, Cr 0.9, proBNP 11   Wt Readings from Last 3 Encounters:  02/14/13 233 lb 3.2 oz (105.779 kg)  11/21/12 249 lb 6.4 oz (113.127 kg)  02/23/12 235 lb (106.595 kg)     Past Medical History  Diagnosis Date  . Hypertension   . SOB (shortness of breath)     a. PFTs ok; b. Chest CT 11/11 with bronchial wall thickening, no mass or parenchymal disease;  c. seen by Dr. Shelle Iron - ? environment asthma vs. GERD (PPI no help)  . Chest pain     a. cath 7/11: dLAD 30%, EF 55-60% (done after abnl Myoview with 5 bts polymorphic VT)  . Asthma     Current Outpatient Prescriptions  Medication Sig Dispense Refill  . albuterol (PROVENTIL HFA;VENTOLIN HFA) 108 (90 BASE) MCG/ACT inhaler Inhale 2 puffs into the lungs 2 (two) times daily.      Marland Kitchen amLODipine (NORVASC) 10 MG tablet Take 1 tablet (10 mg total) by mouth daily.  30 tablet  1  . mometasone-formoterol (DULERA) 100-5 MCG/ACT AERO Inhale 2 puffs into the lungs 2  (two) times daily.  1 Inhaler  9  . predniSONE (DELTASONE) 10 MG tablet Take 4 tabs po x 2 days, then 3 x 2 days, then 2 x 2 days, then 1 x 2 days then stop.  20 tablet  0   No current facility-administered medications for this visit.    Allergies:   No Known Allergies  Social History:  The patient  reports that he has never smoked. He has never used smokeless tobacco. He reports that  drinks alcohol. He reports that he does not use illicit drugs.   ROS:  Please see the history of present illness.  He notes frequent urination and nocturia.  All other systems reviewed and negative.   PHYSICAL EXAM: VS:  BP 154/90  Pulse 70  Ht 6\' 1"  (1.854 m)  Wt 233 lb 3.2 oz (105.779 kg)  BMI 30.77 kg/m2 Well nourished, well developed, in no acute distress HEENT: normal Neck: no JVD Cardiac:  normal S1, S2; RRR; no murmur Lungs:  clear to auscultation bilaterally, no wheezing, rhonchi or rales Abd: soft, nontender, no hepatomegaly Ext: no edema Skin: warm and dry Neuro:  CNs 2-12 intact, no focal abnormalities noted  EKG:  NSR, HR 70, normal axis, NSSTTW changes, PVC  ASSESSMENT AND PLAN:  1. Hypertension:  Uncontrolled.  Add Losartan 50 mg QD.  Check BMET today and in 1 week. 2. CAD:  Minimal plaque on LHC in 2011.  Start ASA 81 QD for primary prevention. Arrange Lipids and LFTs.  3. Asthma:  Managed by pulmonary.  4. Frequent Urination:  Probably BPH.  He has f/u with PCP. 5. Disposition:  F/u with Dr. Charlton Haws in 1 year.   Signed, Tereso Newcomer, PA-C  02/14/2013 12:25 PM

## 2013-02-14 NOTE — Telephone Encounter (Signed)
Message copied by Tarri Fuller on Fri Feb 14, 2013  5:56 PM ------      Message from: [place], [place] T      Created: Fri Feb 14, 2013  4:24 PM       Ok to do BMET 02/21/13 with FLP      Tereso Newcomer, PA-C        02/14/2013 4:23 PM ------

## 2013-02-14 NOTE — Telephone Encounter (Signed)
took labs to D[facility] Dr. Tenny Craw K+ 3.3 today. Per Dr. Tenny Craw pt advised to increase dietary K+ with repeat bmet 02/18/13. pt verbalized understanding to plan of care today. Pt will still come in 02/21/13 for FLP/LFT since he will not be in until 5 pm 8/5 for bmet

## 2013-02-14 NOTE — Telephone Encounter (Signed)
lvm with pt's sister Jasmine December I put in the wrong day for lab work, to cancell the 10/3 and lab is to be done in 1 week. changed to 02/21/13 for FLP/LFT BMET. Jasmine December said she will let pt know. I did lmom on pt's cell as well

## 2013-02-14 NOTE — Patient Instructions (Addendum)
START LOSARTAN 50 MG DAILY  START ASPIRIN 81 MG DAILY  LAB BMET  LABS BMET, FLP,LFT 6-8 WEEKS  PLEASE FOLLOW UP WITH DR. Eden Emms IN YEAR

## 2013-02-21 ENCOUNTER — Other Ambulatory Visit (INDEPENDENT_AMBULATORY_CARE_PROVIDER_SITE_OTHER): Payer: No Typology Code available for payment source

## 2013-02-21 ENCOUNTER — Telehealth: Payer: Self-pay | Admitting: Cardiovascular Disease

## 2013-02-21 ENCOUNTER — Encounter: Payer: Self-pay | Admitting: Cardiovascular Disease

## 2013-02-21 ENCOUNTER — Encounter: Payer: Self-pay | Admitting: *Deleted

## 2013-02-21 DIAGNOSIS — I10 Essential (primary) hypertension: Secondary | ICD-10-CM

## 2013-02-21 LAB — BASIC METABOLIC PANEL
CO2: 28 mEq/L (ref 19–32)
Glucose, Bld: 98 mg/dL (ref 70–99)
Potassium: 3.5 mEq/L (ref 3.5–5.1)
Sodium: 137 mEq/L (ref 135–145)

## 2013-02-21 LAB — HEPATIC FUNCTION PANEL
ALT: 17 U/L (ref 0–53)
AST: 18 U/L (ref 0–37)
Alkaline Phosphatase: 50 U/L (ref 39–117)
Bilirubin, Direct: 0 mg/dL (ref 0.0–0.3)
Total Protein: 7 g/dL (ref 6.0–8.3)

## 2013-02-21 MED ORDER — AMLODIPINE BESYLATE 10 MG PO TABS: 10.0000 mg | ORAL_TABLET | Freq: Every day | ORAL | Status: DC

## 2013-02-21 NOTE — Telephone Encounter (Signed)
Patient would like a refill on his Amlodipine Besylate 10 mg BP med.  Please call to [facility]: [phone].  Let pt know when done.

## 2013-02-21 NOTE — Telephone Encounter (Signed)
RX sent to [facility] to fill #[id number] with 3 refills, left detailed message on pts VM.

## 2013-02-26 ENCOUNTER — Telehealth: Payer: Self-pay | Admitting: *Deleted

## 2013-02-26 NOTE — Telephone Encounter (Signed)
Message copied by Tarri Fuller on Wed Feb 26, 2013  2:57 PM ------      Message from: Wendall Stade      Created: Tue Feb 25, 2013  4:57 PM       K a bit low but not on diuretic.  No bad CAD chol LDL around 130  Needs better diet Rx      Would not start statin at this time            ----- Message -----         From: Tarri Fuller, CMA         Sent: 02/24/2013  12:33 PM           To: Wendall Stade, MD            Preliminary reviewed, sent to MD for sig       ------

## 2013-02-26 NOTE — Telephone Encounter (Signed)
pt's sister sister notified about lab results and plan of care with verbal understanding

## 2013-03-26 ENCOUNTER — Ambulatory Visit: Payer: No Typology Code available for payment source | Admitting: Internal Medicine

## 2013-04-18 ENCOUNTER — Other Ambulatory Visit: Payer: No Typology Code available for payment source

## 2013-08-23 ENCOUNTER — Encounter (HOSPITAL_COMMUNITY): Payer: Self-pay | Admitting: Emergency Medicine

## 2013-08-23 ENCOUNTER — Emergency Department (INDEPENDENT_AMBULATORY_CARE_PROVIDER_SITE_OTHER)
Admission: EM | Admit: 2013-08-23 | Discharge: 2013-08-23 | Disposition: A | Discharge status: Home / Self Care | Payer: 59 | Source: Home / Self Care | Attending: Family Medicine | Admitting: Family Medicine

## 2013-08-23 DIAGNOSIS — M25369 Other instability, unspecified knee: Secondary | ICD-10-CM

## 2013-08-23 DIAGNOSIS — M238X9 Other internal derangements of unspecified knee: Secondary | ICD-10-CM

## 2013-08-23 DIAGNOSIS — M25569 Pain in unspecified knee: Secondary | ICD-10-CM

## 2013-08-23 MED ORDER — MELOXICAM 15 MG PO TABS: 15.0000 mg | ORAL_TABLET | Freq: Every day | ORAL | Status: DC

## 2013-08-23 NOTE — Discharge Instructions (Signed)
Knee Pain Knee pain can be a result of an injury or other medical conditions. Treatment will depend on the cause of your pain. HOME CARE  Only take medicine as told by your doctor.  Keep a healthy weight. Being overweight can make the knee hurt more.  Stretch before exercising or playing sports.  If there is constant knee pain, change the way you exercise. Ask your doctor for advice.  Make sure shoes fit well. Choose the right shoe for the sport or activity.  Protect your knees. Wear kneepads if needed.  Rest when you are tired. GET HELP RIGHT AWAY IF:   Your knee pain does not stop.  Your knee pain does not get better.  Your knee joint feels hot to the touch.  You have a fever. MAKE SURE YOU:   Understand these instructions.  Will watch this condition.  Will get help right away if you are not doing well or get worse. Document Released: 09/29/2008 Document Revised: 09/25/2011 Document Reviewed: 09/29/2008 [employer] Patient Information 2014 [employer].  

## 2013-08-23 NOTE — ED Provider Notes (Signed)
CSN: [id number]     Arrival date & time 08/23/13  0914 History   First MD Initiated Contact with Patient 08/23/13 [phone]     Chief Complaint  Patient presents with  . Extremity Weakness   (Consider location/radiation/quality/duration/timing/severity/associated sxs/prior Treatment) HPI Comments: 63 year old male presents complaining of about one week of intermittently having his knee give out, and some intermittent pain in the back of his knee. This is a new problem for him, he has never had this before and has no history of knee problems. There are no specific alleviating or exacerbating factors to this problem. He denies any injury to the knee. The knee does not hurt at this time. There is no swelling in the knee. There are no other systemic symptoms. There is no back pain and no numbness or weakness in the leg.  Patient is a 63 y.o. male presenting with extremity weakness.  Extremity Weakness Pertinent negatives include no chest pain, no abdominal pain and no shortness of breath.    Past Medical History  Diagnosis Date  . Hypertension   . SOB (shortness of breath)     a. PFTs ok; b. Chest CT 11/11 with bronchial wall thickening, no mass or parenchymal disease;  c. seen by Dr. Gwenette Greet - ? environment asthma vs. GERD (PPI no help)  . Chest pain     a. cath 7/11: dLAD 30%, EF 55-60% (done after abnl Myoview with 5 bts polymorphic VT)  . Asthma    History reviewed. No pertinent past surgical history. Family History  Problem Relation Age of Onset  . Asthma Mother   . Diabetes Mother   . Heart attack Father    History  Substance Use Topics  . Smoking status: Never Smoker   . Smokeless tobacco: Never Used  . Alcohol Use: Yes     Comment: rare - "on weekends"    Review of Systems  Constitutional: Negative for fever, chills and fatigue.  HENT: Negative for sore throat.   Eyes: Negative for visual disturbance.  Respiratory: Negative for cough and shortness of breath.   Cardiovascular:  Negative for chest pain, palpitations and leg swelling.  Gastrointestinal: Negative for nausea, vomiting, abdominal pain, diarrhea and constipation.  Genitourinary: Negative for dysuria, urgency, frequency and hematuria.  Musculoskeletal: Positive for extremity weakness.       See history of present illness  Skin: Negative for rash.  Neurological: Negative for dizziness, weakness and light-headedness.    Allergies  Review of patient's allergies indicates no known allergies.  Home Medications   Current Outpatient Rx  Name  Route  Sig  Dispense  Refill  . albuterol (PROVENTIL HFA;VENTOLIN HFA) 108 (90 BASE) MCG/ACT inhaler   Inhalation   Inhale 2 puffs into the lungs 2 (two) times daily.         Marland Kitchen amLODipine (NORVASC) 10 MG tablet   Oral   Take 1 tablet (10 mg total) by mouth daily.   30 tablet   3   . aspirin EC 81 MG tablet   Oral   Take 1 tablet (81 mg total) by mouth daily.         Marland Kitchen losartan (COZAAR) 50 MG tablet   Oral   Take 1 tablet (50 mg total) by mouth daily.   30 tablet   11   . meloxicam (MOBIC) 15 MG tablet   Oral   Take 1 tablet (15 mg total) by mouth daily.   30 tablet   0   . mometasone-formoterol (  DULERA) 100-5 MCG/ACT AERO   Inhalation   Inhale 2 puffs into the lungs 2 (two) times daily.   1 Inhaler   9   . predniSONE (DELTASONE) 10 MG tablet      Take 4 tabs po x 2 days, then 3 x 2 days, then 2 x 2 days, then 1 x 2 days then stop.   20 tablet   0    BP 133/83  Pulse 52  Temp(Src) 97.8 F (36.6 C) (Oral)  Resp 18  SpO2 97% Physical Exam  Nursing note and vitals reviewed. Constitutional: He is oriented to person, place, and time. He appears well-developed and well-nourished. No distress.  HENT:  Head: Normocephalic.  Pulmonary/Chest: Effort normal. No respiratory distress.  Musculoskeletal:       Right knee: He exhibits swelling (there is minimal swelling in the popliteal fossa, nontender). He exhibits normal range of motion, no  effusion, no ecchymosis, no deformity, no erythema, normal alignment, no LCL laxity, normal patellar mobility, no bony tenderness, normal meniscus and no MCL laxity. No tenderness found.  Neurological: He is alert and oriented to person, place, and time. He has normal strength and normal reflexes. No sensory deficit. He exhibits normal muscle tone. Coordination normal.  Skin: Skin is warm and dry. No rash noted. He is not diaphoretic.  Psychiatric: He has a normal mood and affect. Judgment normal.    ED Course  Procedures (including critical care time) Labs Review Labs Reviewed - No data to display Imaging Review No results found.    MDM   1. Knee pain   2. Knee gives out    Knee exam is almost entirely normal with the session of some possible swelling in the back of the knee. I will start him on daily Mobic and referred to orthopedics for evaluation of possible Bakers cyst that could be causing this problem.   Meds ordered this encounter  Medications  . meloxicam (MOBIC) 15 MG tablet    Sig: Take 1 tablet (15 mg total) by mouth daily.    Dispense:  30 tablet    Refill:  0    Order Specific Question:  Supervising Provider    Answer:  [name], Leon       Liam Graham, PA-C 08/23/13 [phone]

## 2013-08-23 NOTE — ED Notes (Signed)
C/o right leg giving out on him for a week now States leg hurts States behind the knee area it gets stiff and gives out for at least 30 minutes Denies injury

## 2013-08-23 NOTE — ED Provider Notes (Signed)
Medical screening examination/treatment/procedure(s) were performed by a resident physician or non-physician practitioner and as the supervising physician I was immediately available for consultation/collaboration.  Evan Corey, MD    Evan S Corey, MD 08/23/13 2030 

## 2013-10-31 ENCOUNTER — Encounter (HOSPITAL_COMMUNITY): Payer: Self-pay | Admitting: Emergency Medicine

## 2013-10-31 ENCOUNTER — Emergency Department (INDEPENDENT_AMBULATORY_CARE_PROVIDER_SITE_OTHER)
Admission: EM | Admit: 2013-10-31 | Discharge: 2013-10-31 | Disposition: A | Discharge status: Home / Self Care | Payer: 59 | Source: Home / Self Care | Attending: Family Medicine | Admitting: Family Medicine

## 2013-10-31 DIAGNOSIS — I10 Essential (primary) hypertension: Secondary | ICD-10-CM

## 2013-10-31 DIAGNOSIS — M1990 Unspecified osteoarthritis, unspecified site: Secondary | ICD-10-CM

## 2013-10-31 MED ORDER — AMLODIPINE BESYLATE 10 MG PO TABS: 10.0000 mg | ORAL_TABLET | Freq: Every day | ORAL | Status: DC

## 2013-10-31 MED ORDER — MELOXICAM 15 MG PO TABS: 15.0000 mg | ORAL_TABLET | Freq: Every day | ORAL | Status: DC

## 2013-10-31 NOTE — ED Provider Notes (Signed)
Benjamin Hamilton is a 63 y.o. male who presents to Urgent Care today for refill of blood pressure medication and meloxicam. Patient does not have her primary care provider however has been told several times to establish with one. He is not sure how to do this. He would like his amlodipine and meloxicam refill. He denies any chest pain palpitations or shortness of breath. He notes meloxicam is quite helpful for knee pain.  Past Medical History  Diagnosis Date  . Hypertension   . SOB (shortness of breath)     a. PFTs ok; b. Chest CT 11/11 with bronchial wall thickening, no mass or parenchymal disease;  c. seen by Dr. Gwenette Greet - ? environment asthma vs. GERD (PPI no help)  . Chest pain     a. cath 7/11: dLAD 30%, EF 55-60% (done after abnl Myoview with 5 bts polymorphic VT)  . Asthma    History  Substance Use Topics  . Smoking status: Never Smoker   . Smokeless tobacco: Never Used  . Alcohol Use: Yes     Comment: rare - "on weekends"   ROS as above Medications: No current facility-administered medications for this encounter.   Current Outpatient Prescriptions  Medication Sig Dispense Refill  . albuterol (PROVENTIL HFA;VENTOLIN HFA) 108 (90 BASE) MCG/ACT inhaler Inhale 2 puffs into the lungs 2 (two) times daily.      Marland Kitchen amLODipine (NORVASC) 10 MG tablet Take 1 tablet (10 mg total) by mouth daily.  30 tablet  1  . aspirin EC 81 MG tablet Take 1 tablet (81 mg total) by mouth daily.      Marland Kitchen losartan (COZAAR) 50 MG tablet Take 1 tablet (50 mg total) by mouth daily.  30 tablet  11  . meloxicam (MOBIC) 15 MG tablet Take 1 tablet (15 mg total) by mouth daily.  30 tablet  0  . mometasone-formoterol (DULERA) 100-5 MCG/ACT AERO Inhale 2 puffs into the lungs 2 (two) times daily.  1 Inhaler  9  . predniSONE (DELTASONE) 10 MG tablet Take 4 tabs po x 2 days, then 3 x 2 days, then 2 x 2 days, then 1 x 2 days then stop.  20 tablet  0    Exam:  BP 108/70  Pulse 70  Temp(Src) 97.8 F (36.6 C) (Oral)   SpO2 98% Gen: Well NAD HEENT: EOMI,  MMM Lungs: Normal work of breathing. CTABL Heart: RRR no MRG Abd: NABS, Soft. NT, ND Exts: Brisk capillary refill, warm and well perfused.  Knee: Normal range of motion bilaterally   No results found for this or any previous visit (from the past 24 hour(s)). No results found.  Assessment and Plan: 63 y.o. male with hypertension: Refilled amlodipine. Provided patient a list of primary care doctors to followup with. Followup as needed  DJD: Refill limited amount of meloxicam. Recommend patient use Tylenol instead of meloxicam when able  Discussed warning signs or symptoms. Please see discharge instructions. Patient expresses understanding.    Gregor Hams, MD 10/31/13 [phone]

## 2013-10-31 NOTE — ED Notes (Signed)
C/o medication refill on amlodipine and mobic Patient does not have PCP

## 2013-10-31 NOTE — Discharge Instructions (Signed)
Thank you for coming in today. Please establish with a primary care Dr. Primitivo Gauze at the sheet I printed out. Call those offices and set up an appointment. Use meloxicam for knee pain sparingly. Use Tylenol every 6-8 hours for pain for usual days and meloxicam for bad days.  Return as needed.  Call or go to the emergency room if you get worse, have trouble breathing, have chest pains, or palpitations.   Arterial Hypertension Arterial hypertension (high blood pressure) is a condition of elevated pressure in your blood vessels. Hypertension over a long period of time is a risk factor for strokes, heart attacks, and heart failure. It is also the leading cause of kidney (renal) failure.  CAUSES   In Adults -- Over 90% of all hypertension has no known cause. This is called essential or primary hypertension. In the other 10% of people with hypertension, the increase in blood pressure is caused by another disorder. This is called secondary hypertension. Important causes of secondary hypertension are:  Heavy alcohol use.  Obstructive sleep apnea.  Hyperaldosterosim (Conn's syndrome).  Steroid use.  Chronic kidney failure.  Hyperparathyroidism.  Medications.  Renal artery stenosis.  Pheochromocytoma.  Cushing's disease.  Coarctation of the aorta.  Scleroderma renal crisis.  Licorice (in excessive amounts).  Drugs (cocaine, methamphetamine). Your caregiver can explain any items above that apply to you.  In Children -- Secondary hypertension is more common and should always be considered.  Pregnancy -- Few women of childbearing age have high blood pressure. However, up to 10% of them develop hypertension of pregnancy. Generally, this will not harm the woman. It may be a sign of 3 complications of pregnancy: preeclampsia, HELLP syndrome, and eclampsia. Follow up and control with medication is necessary. SYMPTOMS   This condition normally does not produce any noticeable symptoms. It is  usually found during a routine exam.  Malignant hypertension is a late problem of high blood pressure. It may have the following symptoms:  Headaches.  Blurred vision.  End-organ damage (this means your kidneys, heart, lungs, and other organs are being damaged).  Stressful situations can increase the blood pressure. If a person with normal blood pressure has their blood pressure go up while being seen by their caregiver, this is often termed "white coat hypertension." Its importance is not known. It may be related with eventually developing hypertension or complications of hypertension.  Hypertension is often confused with mental tension, stress, and anxiety. DIAGNOSIS  The diagnosis is made by 3 separate blood pressure measurements. They are taken at least 1 week apart from each other. If there is organ damage from hypertension, the diagnosis may be made without repeat measurements. Hypertension is usually identified by having blood pressure readings:  Above 140/90 mmHg measured in both arms, at 3 separate times, over a couple weeks.  Over 130/80 mmHg should be considered a risk factor and may require treatment in patients with diabetes. Blood pressure readings over 120/80 mmHg are called "pre-hypertension" even in non-diabetic patients. To get a true blood pressure measurement, use the following guidelines. Be aware of the factors that can alter blood pressure readings.  Take measurements at least 1 hour after caffeine.  Take measurements 30 minutes after smoking and without any stress. This is another reason to quit smoking  it raises your blood pressure.  Use a proper cuff size. Ask your caregiver if you are not sure about your cuff size.  Most home blood pressure cuffs are automatic. They will measure systolic and diastolic  pressures. The systolic pressure is the pressure reading at the start of sounds. Diastolic pressure is the pressure at which the sounds disappear. If you are  elderly, measure pressures in multiple postures. Try sitting, lying or standing.  Sit at rest for a minimum of 5 minutes before taking measurements.  You should not be on any medications like decongestants. These are found in many cold medications.  Record your blood pressure readings and review them with your caregiver. If you have hypertension:  Your caregiver may do tests to be sure you do not have secondary hypertension (see "causes" above).  Your caregiver may also look for signs of metabolic syndrome. This is also called Syndrome X or Insulin Resistance Syndrome. You may have this syndrome if you have type 2 diabetes, abdominal obesity, and abnormal blood lipids in addition to hypertension.  Your caregiver will take your medical and family history and perform a physical exam.  Diagnostic tests may include blood tests (for glucose, cholesterol, potassium, and kidney function), a urinalysis, or an EKG. Other tests may also be necessary depending on your condition. PREVENTION  There are important lifestyle issues that you can adopt to reduce your chance of developing hypertension:  Maintain a normal weight.  Limit the amount of salt (sodium) in your diet.  Exercise often.  Limit alcohol intake.  Get enough potassium in your diet. Discuss specific advice with your caregiver.  Follow a DASH diet (dietary approaches to stop hypertension). This diet is rich in fruits, vegetables, and low-fat dairy products, and avoids certain fats. PROGNOSIS  Essential hypertension cannot be cured. Lifestyle changes and medical treatment can lower blood pressure and reduce complications. The prognosis of secondary hypertension depends on the underlying cause. Many people whose hypertension is controlled with medicine or lifestyle changes can live a normal, healthy life.  RISKS AND COMPLICATIONS  While high blood pressure alone is not an illness, it often requires treatment due to its short- and  long-term effects on many organs. Hypertension increases your risk for:  CVAs or strokes (cerebrovascular accident).  Heart failure due to chronically high blood pressure (hypertensive cardiomyopathy).  Heart attack (myocardial infarction).  Damage to the retina (hypertensive retinopathy).  Kidney failure (hypertensive nephropathy). Your caregiver can explain list items above that apply to you. Treatment of hypertension can significantly reduce the risk of complications. TREATMENT   For overweight patients, weight loss and regular exercise are recommended. Physical fitness lowers blood pressure.  Mild hypertension is usually treated with diet and exercise. A diet rich in fruits and vegetables, fat-free dairy products, and foods low in fat and salt (sodium) can help lower blood pressure. Decreasing salt intake decreases blood pressure in a 1/3 of people.  Stop smoking if you are a smoker. The steps above are highly effective in reducing blood pressure. While these actions are easy to suggest, they are difficult to achieve. Most patients with moderate or severe hypertension end up requiring medications to bring their blood pressure down to a normal level. There are several classes of medications for treatment. Blood pressure pills (antihypertensives) will lower blood pressure by their different actions. Lowering the blood pressure by 10 mmHg may decrease the risk of complications by as much as 25%. The goal of treatment is effective blood pressure control. This will reduce your risk for complications. Your caregiver will help you determine the best treatment for you according to your lifestyle. What is excellent treatment for one person, may not be for you. HOME CARE INSTRUCTIONS  Do not smoke.  Follow the lifestyle changes outlined in the "Prevention" section.  If you are on medications, follow the directions carefully. Blood pressure medications must be taken as prescribed. Skipping doses  reduces their benefit. It also puts you at risk for problems.  Follow up with your caregiver, as directed.  If you are asked to monitor your blood pressure at home, follow the guidelines in the "Diagnosis" section above. SEEK MEDICAL CARE IF:   You think you are having medication side effects.  You have recurrent headaches or lightheadedness.  You have swelling in your ankles.  You have trouble with your vision. SEEK IMMEDIATE MEDICAL CARE IF:   You have sudden onset of chest pain or pressure, difficulty breathing, or other symptoms of a heart attack.  You have a severe headache.  You have symptoms of a stroke (such as sudden weakness, difficulty speaking, difficulty walking). MAKE SURE YOU:   Understand these instructions.  Will watch your condition.  Will get help right away if you are not doing well or get worse. Document Released: 07/03/2005 Document Revised: 09/25/2011 Document Reviewed: 01/31/2007 [facility] Patient Information 2014 [employer].

## 2013-11-07 ENCOUNTER — Ambulatory Visit: Payer: No Typology Code available for payment source | Admitting: Pulmonary Disease

## 2014-01-09 ENCOUNTER — Other Ambulatory Visit: Payer: Self-pay | Admitting: Pulmonary Disease

## 2014-01-12 NOTE — Telephone Encounter (Signed)
Electronic refill request for Ventolin HFA.  Last ov 10/2012, noshowed for ov w/ [facility] 10/2013.  Pt given 1 refill with note to pt and pharmacy that ov is needed for additional refills

## 2014-04-03 ENCOUNTER — Ambulatory Visit: Payer: 59 | Admitting: Internal Medicine

## 2014-04-03 DIAGNOSIS — Z0289 Encounter for other administrative examinations: Secondary | ICD-10-CM

## 2014-06-06 ENCOUNTER — Encounter (HOSPITAL_COMMUNITY): Payer: Self-pay | Admitting: Nurse Practitioner

## 2014-06-06 ENCOUNTER — Emergency Department (HOSPITAL_COMMUNITY): Payer: 59

## 2014-06-06 ENCOUNTER — Emergency Department (HOSPITAL_COMMUNITY)
Admission: EM | Admit: 2014-06-06 | Discharge: 2014-06-06 | Disposition: A | Discharge status: Home / Self Care | Payer: 59 | Attending: Emergency Medicine | Admitting: Emergency Medicine

## 2014-06-06 DIAGNOSIS — Y9389 Activity, other specified: Secondary | ICD-10-CM | POA: Diagnosis not present

## 2014-06-06 DIAGNOSIS — Z791 Long term (current) use of non-steroidal anti-inflammatories (NSAID): Secondary | ICD-10-CM | POA: Diagnosis not present

## 2014-06-06 DIAGNOSIS — W231XXA Caught, crushed, jammed, or pinched between stationary objects, initial encounter: Secondary | ICD-10-CM | POA: Insufficient documentation

## 2014-06-06 DIAGNOSIS — S6992XA Unspecified injury of left wrist, hand and finger(s), initial encounter: Secondary | ICD-10-CM | POA: Diagnosis present

## 2014-06-06 DIAGNOSIS — Y998 Other external cause status: Secondary | ICD-10-CM | POA: Insufficient documentation

## 2014-06-06 DIAGNOSIS — S5292XA Unspecified fracture of left forearm, initial encounter for closed fracture: Secondary | ICD-10-CM

## 2014-06-06 DIAGNOSIS — Y9289 Other specified places as the place of occurrence of the external cause: Secondary | ICD-10-CM | POA: Diagnosis not present

## 2014-06-06 DIAGNOSIS — J45901 Unspecified asthma with (acute) exacerbation: Secondary | ICD-10-CM | POA: Insufficient documentation

## 2014-06-06 DIAGNOSIS — I10 Essential (primary) hypertension: Secondary | ICD-10-CM | POA: Diagnosis not present

## 2014-06-06 DIAGNOSIS — Z79899 Other long term (current) drug therapy: Secondary | ICD-10-CM | POA: Diagnosis not present

## 2014-06-06 DIAGNOSIS — Z7951 Long term (current) use of inhaled steroids: Secondary | ICD-10-CM | POA: Diagnosis not present

## 2014-06-06 DIAGNOSIS — E780 Pure hypercholesterolemia: Secondary | ICD-10-CM | POA: Diagnosis not present

## 2014-06-06 DIAGNOSIS — Z7982 Long term (current) use of aspirin: Secondary | ICD-10-CM | POA: Diagnosis not present

## 2014-06-06 DIAGNOSIS — S52572A Other intraarticular fracture of lower end of left radius, initial encounter for closed fracture: Secondary | ICD-10-CM | POA: Diagnosis not present

## 2014-06-06 DIAGNOSIS — W010XXA Fall on same level from slipping, tripping and stumbling without subsequent striking against object, initial encounter: Secondary | ICD-10-CM | POA: Diagnosis not present

## 2014-06-06 HISTORY — DX: Pure hypercholesterolemia, unspecified: E78.00

## 2014-06-06 MED ORDER — OXYCODONE-ACETAMINOPHEN 5-325 MG PO TABS
1.0000 | ORAL_TABLET | Freq: Once | ORAL | Status: AC
  Administered 2014-06-06: 1 via ORAL
  Filled 2014-06-06: qty 1

## 2014-06-06 MED ORDER — OXYCODONE-ACETAMINOPHEN 5-325 MG PO TABS: ORAL_TABLET | ORAL | Status: DC

## 2014-06-06 NOTE — ED Provider Notes (Signed)
CSN: [id number]     Arrival date & time 06/06/14  1211 History  This chart was scribed for [employer], PA-C, working with [employer]. Alvino Chapel, MD found by Starleen Arms, ED Scribe. This patient was seen in room TR08C/TR08C and the patient's care was started at 1:07 PM.   Chief Complaint  Patient presents with  . Wrist Injury   HPI HPI Comments: Benjamin Hamilton is a 63 y.o. male who presents to the Emergency Department complaining of a left wrist injury sustained earlier today when a couch he was moving was dropped and fell, sandwiching his hand between the couch and floor.  Patient complains of severe left wrist pain that has currently improved after administration of pain medication.  Patient is right handed.  Patient denies numbness/tingling distal to the injury.     Past Medical History  Diagnosis Date  . Hypertension   . SOB (shortness of breath)     a. PFTs ok; b. Chest CT 11/11 with bronchial wall thickening, no mass or parenchymal disease;  c. seen by Dr. Gwenette Greet - ? environment asthma vs. GERD (PPI no help)  . Chest pain     a. cath 7/11: dLAD 30%, EF 55-60% (done after abnl Myoview with 5 bts polymorphic VT)  . Asthma   . Hypercholesteremia    History reviewed. No pertinent past surgical history. Family History  Problem Relation Age of Onset  . Asthma Mother   . Diabetes Mother   . Heart attack Father    History  Substance Use Topics  . Smoking status: Never Smoker   . Smokeless tobacco: Never Used  . Alcohol Use: Yes     Comment: rare - "on weekends"    Review of Systems A complete 10 system review of systems was obtained and all systems are negative except as noted in the HPI and PMH.   Allergies  Review of patient's allergies indicates no known allergies.  Home Medications   Prior to Admission medications   Medication Sig Start Date End Date Taking? Authorizing Provider  albuterol (VENTOLIN HFA) 108 (90 BASE) MCG/ACT inhaler Inhale 2 puffs into the lungs  every 4 (four) hours as needed for wheezing or shortness of breath. NEEDS APPOINTMENT FOR REFILLS   Yes Kathee Delton, MD  amLODipine (NORVASC) 10 MG tablet Take 1 tablet (10 mg total) by mouth daily. 10/31/13  Yes Gregor Hams, MD  losartan (COZAAR) 50 MG tablet Take 1 tablet (50 mg total) by mouth daily. 02/14/13  Yes Scott Joylene Draft, PA-C  pravastatin (PRAVACHOL) 20 MG tablet Take 20 mg by mouth at bedtime.  05/28/14  Yes Historical Provider, MD  aspirin EC 81 MG tablet Take 1 tablet (81 mg total) by mouth daily. Patient not taking: Reported on 06/06/2014 02/14/13   Liliane Shi, PA-C  meloxicam (MOBIC) 15 MG tablet Take 1 tablet (15 mg total) by mouth daily. Patient not taking: Reported on 06/06/2014 10/31/13   Gregor Hams, MD  mometasone-formoterol [facility]) 100-5 MCG/ACT AERO Inhale 2 puffs into the lungs 2 (two) times daily. Patient not taking: Reported on 06/06/2014 01/03/13   Kathee Delton, MD  oxyCODONE-acetaminophen (PERCOCET/ROXICET) 5-325 MG per tablet 1 to 2 tabs PO q6hrs  PRN for pain 06/06/14   Elmyra Ricks Tene Gato, PA-C  predniSONE (DELTASONE) 10 MG tablet Take 4 tabs po x 2 days, then 3 x 2 days, then 2 x 2 days, then 1 x 2 days then stop. Patient not taking: Reported on 06/06/2014  11/21/12   Kathee Delton, MD   BP 115/70 mmHg  Pulse 71  Temp(Src) 97.8 F (36.6 C)  Resp 18  Ht 6' (1.829 m)  Wt 230 lb (104.327 kg)  BMI 31.19 kg/m2  SpO2 93% Physical Exam  Constitutional: He is oriented to person, place, and time. He appears well-developed and well-nourished. No distress.  HENT:  Head: Normocephalic.  Eyes: Conjunctivae and EOM are normal.  Cardiovascular: Normal rate.   Pulmonary/Chest: Effort normal. No stridor.  Musculoskeletal: Normal range of motion. He exhibits edema and tenderness.  No overlying skin trauma. Left wrist with mild/moderate swelling and TTP on volar and dorsal side. No significant deformity. Radial pulse is 2+. Excellent range of motion to fingers and  sensation intact.   Neurological: He is alert and oriented to person, place, and time.  Psychiatric: He has a normal mood and affect.  Nursing note and vitals reviewed.   ED Course  SPLINT APPLICATION Date/Time: 16/04/9603 10:41 AM Performed by: Manson Passey, THERAPIST 1 Authorized by: Monico Blitz Consent: Verbal consent obtained. Consent given by: patient Patient identity confirmed: verbally with patient Location details: left wrist Splint type: sugar tong Supplies used: cotton padding and Ortho-Glass Post-procedure: The splinted body part was neurovascularly unchanged following the procedure. Patient tolerance: Patient tolerated the procedure well with no immediate complications   (including critical care time)  DIAGNOSTIC STUDIES: Oxygen Saturation is 96% on RA, normal by my interpretation.    COORDINATION OF CARE:  1:20 PM Informed patient that imaging shows a fracture.  Will provide patient with splint and refer patient to hand surgeon.  Will prescribe pain medication.  Patient acknowledges and agrees with plan.    Labs Review Labs Reviewed - No data to display  Imaging Review Dg Wrist Complete Left  06/06/2014   CLINICAL DATA:  Fall with left wrist injury, pain and swelling.  EXAM: LEFT WRIST - COMPLETE 3+ VIEW  COMPARISON:  None.  FINDINGS: A comminuted intra-articular impacted distal radial fracture is identified with at least 5 mm distraction of fragments at the articular surface.  There is no evidence of subluxation or dislocation.  Soft tissue swelling is present.  IMPRESSION: Comminuted intra-articular distal radial fracture.   Electronically Signed   By: Hassan Rowan M.D.   On: 06/06/2014 13:20     EKG Interpretation None      MDM   Final diagnoses:  Radius fracture, left, closed, initial encounter    Filed Vitals:   06/06/14 1215 06/06/14 1428  BP: 123/81 115/70  Pulse: 82 71  Temp: 97.8 F (36.6 C)   Resp: 16 18  Height: 6' (1.829 m)   Weight: 230 lb  (104.327 kg)   SpO2: 96% 93%    Medications  oxyCODONE-acetaminophen (PERCOCET/ROXICET) 5-325 MG per tablet 1 tablet (1 tablet Oral Given 06/06/14 1241)    Benjamin Hamilton is a 63 y.o. male presenting with left wrist pain and swelling after wrist was impacted between heavy couch and ground. Radial pulse 2+ and distally NVI. XR with comminuted distal radius fracture. Ortho consult from Dr. Grandville Silos appreciated: advises that there is no need for emergent reduction unless dorsal angulation is severe. Discussed case with attending MD who agrees with plan and stability to d/c to home. Pt placed in sugar tong splint and Thompson's office will call to set up outpt eval.   Evaluation does not show pathology that would require ongoing emergent intervention or inpatient treatment. Pt is hemodynamically stable and mentating appropriately. Discussed findings and  plan with patient/guardian, who agrees with care plan. All questions answered. Return precautions discussed and outpatient follow up given.   Discharge Medication List as of 06/06/2014  2:22 PM    START taking these medications   Details  oxyCODONE-acetaminophen (PERCOCET/ROXICET) 5-325 MG per tablet 1 to 2 tabs PO q6hrs  PRN for pain, Print         I personally performed the services described in this documentation, which was scribed in my presence. The recorded information has been reviewed and is accurate.    Monico Blitz, PA-C 06/08/14 [place] Alvino Chapel, [place] 06/15/14 [phone]

## 2014-06-06 NOTE — Discharge Instructions (Signed)
Dr. Biagio Borg office will call to set up an appointment on Monday.  Rest, Ice intermittently (in the first 24-48 hours), Gentle compression with an Ace wrap, and elevate (Limb above the level of the heart)   Take up to 800mg  of ibuprofen (that is usually 4 over the counter pills)  3 times a day for 5 days. Take with food.  Take percocet for breakthrough pain, do not drink alcohol, drive, care for children or do other critical tasks while taking percocet.  Please follow with your primary care doctor in the next 2 days for a check-up. They must obtain records for further management.   Do not hesitate to return to the Emergency Department for any new, worsening or concerning symptoms.     Radial Fracture You have a broken bone (fracture) of the forearm. This is the part of your arm between the elbow and your wrist. Your forearm is made up of two bones. These are the radius and ulna. Your fracture is in the radial shaft. This is the bone in your forearm located on the thumb side. A cast or splint is used to protect and keep your injured bone from moving. The cast or splint will be on generally for about 5 to 6 weeks, with individual variations. HOME CARE INSTRUCTIONS   Keep the injured part elevated while sitting or lying down. Keep the injury above the level of your heart (the center of the chest). This will decrease swelling and pain.  Apply ice to the injury for 15-20 minutes, 03-04 times per day while awake, for 2 days. Put the ice in a plastic bag and place a towel between the bag of ice and your cast or splint.  Move your fingers to avoid stiffness and minimize swelling.  If you have a plaster or fiberglass cast:  Do not try to scratch the skin under the cast using sharp or pointed objects.  Check the skin around the cast every day. You may put lotion on any red or sore areas.  Keep your cast dry and clean.  If you have a plaster splint:  Wear the splint as directed.  You may  loosen the elastic around the splint if your fingers become numb, tingle, or turn cold or blue.  Do not put pressure on any part of your cast or splint. It may break. Rest your cast only on a pillow for the first 24 hours until it is fully hardened.  Your cast or splint can be protected during bathing with a plastic bag. Do not lower the cast or splint into water.  Only take over-the-counter or prescription medicines for pain, discomfort, or fever as directed by your caregiver. SEEK IMMEDIATE MEDICAL CARE IF:   Your cast gets damaged or breaks.  You have more severe pain or swelling than you did before getting the cast.  You have severe pain when stretching your fingers.  There is a bad smell, new stains and/or pus-like (purulent) drainage coming from under the cast.  Your fingers or hand turn pale or blue and become cold or your loose feeling. Document Released: 12/14/2005 Document Revised: 09/25/2011 Document Reviewed: 03/12/2006 [facility] Patient Information 2015 [place], [place]. This information is not intended to replace advice given to you by your health care provider. Make sure you discuss any questions you have with your health care provider.

## 2014-06-06 NOTE — ED Notes (Signed)
Declined W/C at D/C and was escorted to lobby by RN. 

## 2014-06-06 NOTE — ED Notes (Signed)
He was moving a heavy piece of furniture about 1 hour ago and it fell onto his L wrist. Swelling noted to posterior L wrist with pain. Cms intact

## 2014-06-06 NOTE — ED Notes (Signed)
Ortho Tech called for splint placement.

## 2014-06-06 NOTE — Progress Notes (Signed)
Orthopedic Tech Progress Note Patient Details:  Benjamin Hamilton [birth year] [id number] Applied fiberglass sugar tong splint to LUE.  Pulses, sensation, motion intact before and after application.  Capillary refill less than 2 seconds before and after application.  Placed LUE in arm sling. Ortho Devices Type of Ortho Device: Sugartong splint Ortho Device/Splint Location: LUE Ortho Device/Splint Interventions: Application   Darrol Poke 06/06/2014, 2:49 PM

## 2014-06-09 ENCOUNTER — Encounter (HOSPITAL_BASED_OUTPATIENT_CLINIC_OR_DEPARTMENT_OTHER): Payer: Self-pay | Admitting: *Deleted

## 2014-06-09 ENCOUNTER — Other Ambulatory Visit: Payer: Self-pay | Admitting: Orthopedic Surgery

## 2014-06-09 NOTE — Progress Notes (Signed)
Pt has asthma-controlled-sees dr clance-last 5/14-had sob chest pains 2011-had a cath-treat medically Has not seen since 2012 To come in for [id number]

## 2014-06-10 ENCOUNTER — Encounter (HOSPITAL_BASED_OUTPATIENT_CLINIC_OR_DEPARTMENT_OTHER)
Admission: RE | Admit: 2014-06-10 | Discharge: 2014-06-10 | Disposition: A | Discharge status: Home / Self Care | Payer: 59 | Source: Ambulatory Visit | Attending: Orthopedic Surgery | Admitting: Orthopedic Surgery

## 2014-06-10 DIAGNOSIS — Y999 Unspecified external cause status: Secondary | ICD-10-CM | POA: Diagnosis not present

## 2014-06-10 DIAGNOSIS — S52572A Other intraarticular fracture of lower end of left radius, initial encounter for closed fracture: Secondary | ICD-10-CM | POA: Diagnosis not present

## 2014-06-10 DIAGNOSIS — Y929 Unspecified place or not applicable: Secondary | ICD-10-CM | POA: Diagnosis not present

## 2014-06-10 DIAGNOSIS — X58XXXA Exposure to other specified factors, initial encounter: Secondary | ICD-10-CM | POA: Diagnosis not present

## 2014-06-10 DIAGNOSIS — S6992XA Unspecified injury of left wrist, hand and finger(s), initial encounter: Secondary | ICD-10-CM | POA: Diagnosis present

## 2014-06-10 DIAGNOSIS — Z01818 Encounter for other preprocedural examination: Secondary | ICD-10-CM | POA: Insufficient documentation

## 2014-06-10 DIAGNOSIS — Y9389 Activity, other specified: Secondary | ICD-10-CM | POA: Diagnosis not present

## 2014-06-10 DIAGNOSIS — Z825 Family history of asthma and other chronic lower respiratory diseases: Secondary | ICD-10-CM | POA: Diagnosis not present

## 2014-06-10 DIAGNOSIS — I10 Essential (primary) hypertension: Secondary | ICD-10-CM | POA: Diagnosis not present

## 2014-06-10 DIAGNOSIS — J45909 Unspecified asthma, uncomplicated: Secondary | ICD-10-CM | POA: Diagnosis not present

## 2014-06-10 DIAGNOSIS — E780 Pure hypercholesterolemia: Secondary | ICD-10-CM | POA: Diagnosis not present

## 2014-06-10 LAB — BASIC METABOLIC PANEL
ANION GAP: 10 (ref 5–15)
BUN: 15 mg/dL (ref 6–23)
CO2: 27 mEq/L (ref 19–32)
CREATININE: 0.86 mg/dL (ref 0.50–1.35)
Calcium: 9.5 mg/dL (ref 8.4–10.5)
Chloride: 100 mEq/L (ref 96–112)
GFR calc non Af Amer: >90 mL/min (ref >90)
Glucose, Bld: 91 mg/dL (ref 70–99)
Potassium: 4.6 mEq/L (ref 3.7–5.3)
Sodium: 137 mEq/L (ref 137–147)

## 2014-06-10 NOTE — H&P (Signed)
Benjamin Hamilton is an 63 y.o. male.   CC / Reason for Visit: Left wrist injury HPI: This patient is a 63 year old RHD male who presents for evaluation of a left wrist injury that occurred on the date above when he was moving a couch, which dropped and fell, sandwiching his hand between a couch and the floor.  He was noted to have a displaced comminuted intra-articular distal radius fracture and was placed into a sugar tong splint.  He has been finding analgesia acceptable with his present plan.  Past Medical History  Diagnosis Date  . Hypertension   . SOB (shortness of breath)     a. PFTs ok; b. Chest CT 11/11 with bronchial wall thickening, no mass or parenchymal disease;  c. seen by Dr. Gwenette Greet - ? environment asthma vs. GERD (PPI no help)  . Chest pain     a. cath 7/11: dLAD 30%, EF 55-60% (done after abnl Myoview with 5 bts polymorphic VT)  . Asthma   . Hypercholesteremia   . Wears dentures     top    Past Surgical History  Procedure Laterality Date  . Hernia repair  1988    right ing   . Cardiac catheterization  2011    Family History  Problem Relation Age of Onset  . Asthma Mother   . Diabetes Mother   . Heart attack Father    Social History:  reports that he has never smoked. He has never used smokeless tobacco. He reports that he drinks alcohol. He reports that he does not use illicit drugs.  Allergies: No Known Allergies  No prescriptions prior to admission    Results for orders placed or performed during the hospital encounter of 06/15/14 (from the past 48 hour(s))  Basic metabolic panel     Status: None   Collection Time: 06/10/14 10:10 AM  Result Value Ref Range   Sodium 137 137 - 147 mEq/L   Potassium 4.6 3.7 - 5.3 mEq/L   Chloride 100 96 - 112 mEq/L   CO2 27 19 - 32 mEq/L   Glucose, Bld 91 70 - 99 mg/dL   BUN 15 6 - 23 mg/dL   Creatinine, Ser 0.86 0.50 - 1.35 mg/dL   Calcium 9.5 8.4 - 10.5 mg/dL   GFR calc non Af Amer >90 >90 mL/min   GFR calc Af Amer  >90 >90 mL/min    Comment: (NOTE) The eGFR has been calculated using the CKD EPI equation. This calculation has not been validated in all clinical situations. eGFR's persistently <90 mL/min signify possible Chronic Kidney Disease.    Anion gap 10 5 - 15   No results found.  Review of Systems  All other systems reviewed and are negative.   Height 6' (1.829 m), weight 104.327 kg (230 lb). Physical Exam  Constitutional:  WD, WN, NAD HEENT:  NCAT, EOMI Neuro/Psych:  Alert & oriented to person, place, and time; appropriate mood & affect Lymphatic: No generalized UE edema or lymphadenopathy Extremities / MSK:  Both UE are normal with respect to appearance, ranges of motion, joint stability, muscle strength/tone, sensation, & perfusion except as otherwise noted:  Left hand mild to moderately swollen, digits are stiff achieving flexion to within 4 cm of the palm.  Intact light touch sensibility across all the digits, with intact motor to the same.  Labs / Xrays:  No radiographic studies obtained today.  Injury x-rays reviewed and reveal a comminuted intra-articular distal radius fracture on the left,  with intra-articular diastases and incongruity and shortening  Assessment: Comminuted intra-articular distal radius fracture on the left  Plan:  I discussed these findings with him and recommended open reduction/internal fixation.  I reviewed the problems related to intra-articular incongruity and shortening and how we would attempt to improve the alignment surgically to help avoid those problems.  I used a plastic models and showed him actually what one of these plates looks like.  We will plan to proceed on Monday.   The details of the operative procedure were discussed with the patient.  Questions were invited and answered.  In addition to the goal of the procedure, the risks of the procedure to include but not limited to bleeding; infection; damage to the nerves or blood vessels that could  result in bleeding, numbness, weakness, chronic pain, and the need for additional procedures; stiffness; the need for revision surgery; and anesthetic risks, were reviewed.  No specific outcome was guaranteed or implied.  Informed consent was obtained.  Anaysia Germer A. 06/10/2014, 6:26 PM

## 2014-06-15 ENCOUNTER — Encounter (HOSPITAL_BASED_OUTPATIENT_CLINIC_OR_DEPARTMENT_OTHER)
Admission: RE | Disposition: A | Discharge status: Home / Self Care | Payer: Self-pay | Source: Ambulatory Visit | Attending: Orthopedic Surgery

## 2014-06-15 ENCOUNTER — Ambulatory Visit (HOSPITAL_COMMUNITY): Payer: 59

## 2014-06-15 ENCOUNTER — Encounter (HOSPITAL_BASED_OUTPATIENT_CLINIC_OR_DEPARTMENT_OTHER): Payer: Self-pay | Admitting: Certified Registered"

## 2014-06-15 ENCOUNTER — Ambulatory Visit (HOSPITAL_BASED_OUTPATIENT_CLINIC_OR_DEPARTMENT_OTHER): Payer: 59 | Admitting: Certified Registered"

## 2014-06-15 ENCOUNTER — Ambulatory Visit (HOSPITAL_BASED_OUTPATIENT_CLINIC_OR_DEPARTMENT_OTHER)
Admission: RE | Admit: 2014-06-15 | Discharge: 2014-06-15 | Disposition: A | Discharge status: Home / Self Care | Payer: 59 | Source: Ambulatory Visit | Attending: Orthopedic Surgery | Admitting: Orthopedic Surgery

## 2014-06-15 DIAGNOSIS — Y9389 Activity, other specified: Secondary | ICD-10-CM | POA: Insufficient documentation

## 2014-06-15 DIAGNOSIS — I10 Essential (primary) hypertension: Secondary | ICD-10-CM | POA: Insufficient documentation

## 2014-06-15 DIAGNOSIS — X58XXXA Exposure to other specified factors, initial encounter: Secondary | ICD-10-CM | POA: Insufficient documentation

## 2014-06-15 DIAGNOSIS — S52572A Other intraarticular fracture of lower end of left radius, initial encounter for closed fracture: Secondary | ICD-10-CM | POA: Diagnosis not present

## 2014-06-15 DIAGNOSIS — Y929 Unspecified place or not applicable: Secondary | ICD-10-CM | POA: Insufficient documentation

## 2014-06-15 DIAGNOSIS — Y999 Unspecified external cause status: Secondary | ICD-10-CM | POA: Insufficient documentation

## 2014-06-15 DIAGNOSIS — Z825 Family history of asthma and other chronic lower respiratory diseases: Secondary | ICD-10-CM | POA: Insufficient documentation

## 2014-06-15 DIAGNOSIS — J45909 Unspecified asthma, uncomplicated: Secondary | ICD-10-CM | POA: Insufficient documentation

## 2014-06-15 DIAGNOSIS — E780 Pure hypercholesterolemia: Secondary | ICD-10-CM | POA: Insufficient documentation

## 2014-06-15 DIAGNOSIS — Z419 Encounter for procedure for purposes other than remedying health state, unspecified: Secondary | ICD-10-CM

## 2014-06-15 HISTORY — DX: Presence of dental prosthetic device (complete) (partial): Z97.2

## 2014-06-15 HISTORY — PX: OPEN REDUCTION INTERNAL FIXATION (ORIF) DISTAL RADIAL FRACTURE: SHX5989

## 2014-06-15 SURGERY — OPEN REDUCTION INTERNAL FIXATION (ORIF) DISTAL RADIUS FRACTURE
Anesthesia: General | Site: Wrist | Laterality: Left

## 2014-06-15 MED ORDER — LIDOCAINE HCL (CARDIAC) 20 MG/ML IV SOLN
INTRAVENOUS | Status: DC | PRN
  Administered 2014-06-15: 30 mg via INTRAVENOUS

## 2014-06-15 MED ORDER — LIDOCAINE HCL 2 % IJ SOLN
INTRAMUSCULAR | Status: AC
  Filled 2014-06-15: qty 40

## 2014-06-15 MED ORDER — CEFAZOLIN SODIUM-DEXTROSE 2-3 GM-% IV SOLR
INTRAVENOUS | Status: AC
  Filled 2014-06-15: qty 50

## 2014-06-15 MED ORDER — OXYCODONE-ACETAMINOPHEN 5-325 MG PO TABS: 1.0000 | ORAL_TABLET | ORAL | Status: DC | PRN

## 2014-06-15 MED ORDER — ONDANSETRON HCL 4 MG/2ML IJ SOLN
INTRAMUSCULAR | Status: DC | PRN
  Administered 2014-06-15: 4 mg via INTRAVENOUS

## 2014-06-15 MED ORDER — MIDAZOLAM HCL 2 MG/2ML IJ SOLN
1.0000 mg | INTRAMUSCULAR | Status: DC | PRN
  Administered 2014-06-15: 2 mg via INTRAVENOUS

## 2014-06-15 MED ORDER — DEXAMETHASONE SODIUM PHOSPHATE 10 MG/ML IJ SOLN
INTRAMUSCULAR | Status: DC | PRN
  Administered 2014-06-15: 10 mg via INTRAVENOUS

## 2014-06-15 MED ORDER — OXYCODONE HCL 5 MG PO TABS
5.0000 mg | ORAL_TABLET | Freq: Once | ORAL | Status: AC | PRN
  Administered 2014-06-15: 5 mg via ORAL

## 2014-06-15 MED ORDER — BUPIVACAINE-EPINEPHRINE (PF) 0.5% -1:200000 IJ SOLN
INTRAMUSCULAR | Status: AC
  Filled 2014-06-15: qty 60

## 2014-06-15 MED ORDER — HYDROMORPHONE HCL 1 MG/ML IJ SOLN
INTRAMUSCULAR | Status: AC
  Filled 2014-06-15: qty 1

## 2014-06-15 MED ORDER — HYDROMORPHONE HCL 1 MG/ML IJ SOLN
0.2500 mg | INTRAMUSCULAR | Status: DC | PRN
  Administered 2014-06-15: 0.5 mg via INTRAVENOUS

## 2014-06-15 MED ORDER — OXYCODONE HCL 5 MG/5ML PO SOLN: 5.0000 mg | Freq: Once | ORAL | Status: AC | PRN

## 2014-06-15 MED ORDER — FENTANYL CITRATE 0.05 MG/ML IJ SOLN
INTRAMUSCULAR | Status: AC
  Filled 2014-06-15: qty 6

## 2014-06-15 MED ORDER — BUPIVACAINE HCL (PF) 0.5 % IJ SOLN
INTRAMUSCULAR | Status: AC
  Filled 2014-06-15: qty 30

## 2014-06-15 MED ORDER — PROMETHAZINE HCL 25 MG/ML IJ SOLN: 6.2500 mg | INTRAMUSCULAR | Status: DC | PRN

## 2014-06-15 MED ORDER — FENTANYL CITRATE 0.05 MG/ML IJ SOLN
50.0000 ug | INTRAMUSCULAR | Status: DC | PRN
Start: 2014-06-15 — End: 2014-06-15
  Administered 2014-06-15: 100 ug via INTRAVENOUS

## 2014-06-15 MED ORDER — MIDAZOLAM HCL 2 MG/2ML IJ SOLN: 1.0000 mg | INTRAMUSCULAR | Status: DC | PRN

## 2014-06-15 MED ORDER — EPHEDRINE SULFATE 50 MG/ML IJ SOLN
INTRAMUSCULAR | Status: DC | PRN
  Administered 2014-06-15: 10 mg via INTRAVENOUS

## 2014-06-15 MED ORDER — PROPOFOL 10 MG/ML IV BOLUS
INTRAVENOUS | Status: DC | PRN
  Administered 2014-06-15: 200 mg via INTRAVENOUS

## 2014-06-15 MED ORDER — LACTATED RINGERS IV SOLN
INTRAVENOUS | Status: DC
  Administered 2014-06-15 (×2): via INTRAVENOUS

## 2014-06-15 MED ORDER — MIDAZOLAM HCL 2 MG/2ML IJ SOLN
INTRAMUSCULAR | Status: AC
  Filled 2014-06-15: qty 2

## 2014-06-15 MED ORDER — FENTANYL CITRATE 0.05 MG/ML IJ SOLN
INTRAMUSCULAR | Status: AC
  Filled 2014-06-15: qty 2

## 2014-06-15 MED ORDER — PROPOFOL 10 MG/ML IV EMUL
INTRAVENOUS | Status: AC
  Filled 2014-06-15: qty 50

## 2014-06-15 MED ORDER — LACTATED RINGERS IV SOLN: INTRAVENOUS | Status: DC

## 2014-06-15 MED ORDER — OXYCODONE HCL 5 MG PO TABS
ORAL_TABLET | ORAL | Status: AC
  Filled 2014-06-15: qty 1

## 2014-06-15 MED ORDER — BUPIVACAINE-EPINEPHRINE (PF) 0.5% -1:200000 IJ SOLN
INTRAMUSCULAR | Status: DC | PRN
  Administered 2014-06-15: 30 mL via PERINEURAL

## 2014-06-15 MED ORDER — SUCCINYLCHOLINE CHLORIDE 20 MG/ML IJ SOLN
INTRAMUSCULAR | Status: AC
  Filled 2014-06-15: qty 1

## 2014-06-15 MED ORDER — BUPIVACAINE HCL (PF) 0.25 % IJ SOLN
INTRAMUSCULAR | Status: AC
  Filled 2014-06-15: qty 30

## 2014-06-15 MED ORDER — LIDOCAINE-EPINEPHRINE 0.5 %-1:200000 IJ SOLN
INTRAMUSCULAR | Status: AC
  Filled 2014-06-15: qty 1

## 2014-06-15 MED ORDER — FENTANYL CITRATE 0.05 MG/ML IJ SOLN
INTRAMUSCULAR | Status: DC | PRN
  Administered 2014-06-15 (×4): 25 ug via INTRAVENOUS

## 2014-06-15 MED ORDER — CEFAZOLIN SODIUM-DEXTROSE 2-3 GM-% IV SOLR
2.0000 g | INTRAVENOUS | Status: AC
  Administered 2014-06-15: 2 g via INTRAVENOUS

## 2014-06-15 MED ORDER — FENTANYL CITRATE 0.05 MG/ML IJ SOLN: 50.0000 ug | INTRAMUSCULAR | Status: DC | PRN

## 2014-06-15 SURGICAL SUPPLY — 60 items
BANDAGE COBAN STERILE 2 (GAUZE/BANDAGES/DRESSINGS) IMPLANT
BLADE MINI RND TIP GREEN BEAV (BLADE) IMPLANT
BLADE SURG 15 STRL LF DISP TIS (BLADE) ×1 IMPLANT
BLADE SURG 15 STRL SS (BLADE) ×3
BNDG CMPR 9X4 STRL LF SNTH (GAUZE/BANDAGES/DRESSINGS) ×1
BNDG COHESIVE 4X5 TAN STRL (GAUZE/BANDAGES/DRESSINGS) ×3 IMPLANT
BNDG ESMARK 4X9 LF (GAUZE/BANDAGES/DRESSINGS) ×3 IMPLANT
BNDG GAUZE ELAST 4 BULKY (GAUZE/BANDAGES/DRESSINGS) ×6 IMPLANT
BRUSH SCRUB EZ PLAIN DRY (MISCELLANEOUS) IMPLANT
CANISTER SUCT 1200ML W/VALVE (MISCELLANEOUS) ×3 IMPLANT
CHLORAPREP W/TINT 26ML (MISCELLANEOUS) ×3 IMPLANT
CORDS BIPOLAR (ELECTRODE) ×3 IMPLANT
COVER BACK TABLE 60X90IN (DRAPES) ×3 IMPLANT
COVER MAYO STAND STRL (DRAPES) ×3 IMPLANT
CUFF TOURNIQUET SINGLE 18IN (TOURNIQUET CUFF) ×2 IMPLANT
CUFF TOURNIQUET SINGLE 24IN (TOURNIQUET CUFF) IMPLANT
DRAPE C-ARM 42X72 X-RAY (DRAPES) ×3 IMPLANT
DRAPE EXTREMITY T 121X128X90 (DRAPE) ×3 IMPLANT
DRAPE SURG 17X23 STRL (DRAPES) ×3 IMPLANT
DRILL ×4 IMPLANT
DRSG ADAPTIC 3X8 NADH LF (GAUZE/BANDAGES/DRESSINGS) IMPLANT
DRSG EMULSION OIL 3X3 NADH (GAUZE/BANDAGES/DRESSINGS) ×2 IMPLANT
ELECT REM PT RETURN 9FT ADLT (ELECTROSURGICAL) ×3
ELECTRODE REM PT RTRN 9FT ADLT (ELECTROSURGICAL) ×1 IMPLANT
GAUZE SPONGE 4X4 12PLY STRL (GAUZE/BANDAGES/DRESSINGS) ×3 IMPLANT
GLOVE BIO SURGEON STRL SZ7.5 (GLOVE) ×3 IMPLANT
GLOVE BIOGEL PI IND STRL 7.0 (GLOVE) ×2 IMPLANT
GLOVE BIOGEL PI IND STRL 8 (GLOVE) ×1 IMPLANT
GLOVE BIOGEL PI INDICATOR 7.0 (GLOVE) ×4
GLOVE BIOGEL PI INDICATOR 8 (GLOVE) ×2
GLOVE ECLIPSE 6.5 STRL STRAW (GLOVE) ×6 IMPLANT
GOWN STRL REUS W/ TWL LRG LVL3 (GOWN DISPOSABLE) ×1 IMPLANT
GOWN STRL REUS W/TWL LRG LVL3 (GOWN DISPOSABLE) ×3
GOWN STRL REUS W/TWL XL LVL3 (GOWN DISPOSABLE) ×3 IMPLANT
K-WIRE ×6 IMPLANT
NDL HYPO 25X1 1.5 SAFETY (NEEDLE) IMPLANT
NEEDLE HYPO 25X1 1.5 SAFETY (NEEDLE) ×3 IMPLANT
NS IRRIG 1000ML POUR BTL (IV SOLUTION) ×3 IMPLANT
PACK BASIN DAY SURGERY FS (CUSTOM PROCEDURE TRAY) ×3 IMPLANT
PADDING CAST ABS 4INX4YD NS (CAST SUPPLIES) ×2
PADDING CAST ABS COTTON 4X4 ST (CAST SUPPLIES) IMPLANT
PENCIL BUTTON HOLSTER BLD 10FT (ELECTRODE) ×3 IMPLANT
RUBBERBAND STERILE (MISCELLANEOUS) IMPLANT
SKELETAL DYNAMICS DVR SET (Set) ×3 IMPLANT
SLEEVE SCD COMPRESS KNEE MED (MISCELLANEOUS) ×3 IMPLANT
SLING ARM FOAM STRAP LRG (SOFTGOODS) ×3 IMPLANT
STOCKINETTE 6  STRL (DRAPES) ×2
STOCKINETTE 6 STRL (DRAPES) ×1 IMPLANT
SUCTION FRAZIER TIP 10 FR DISP (SUCTIONS) ×3 IMPLANT
SUT VIC AB 2-0 PS2 27 (SUTURE) ×3 IMPLANT
SUT VICRYL 4-0 PS2 18IN ABS (SUTURE) IMPLANT
SUT VICRYL RAPIDE 4-0 (SUTURE) IMPLANT
SUT VICRYL RAPIDE 4/0 PS 2 (SUTURE) ×3 IMPLANT
SYR BULB 3OZ (MISCELLANEOUS) ×3 IMPLANT
SYRINGE 10CC LL (SYRINGE) ×2 IMPLANT
TOWEL OR 17X24 6PK STRL BLUE (TOWEL DISPOSABLE) ×3 IMPLANT
TOWEL OR NON WOVEN STRL DISP B (DISPOSABLE) ×3 IMPLANT
TUBE CONNECTING 20'X1/4 (TUBING) ×1
TUBE CONNECTING 20X1/4 (TUBING) ×2 IMPLANT
UNDERPAD 30X30 INCONTINENT (UNDERPADS AND DIAPERS) ×3 IMPLANT

## 2014-06-15 NOTE — Anesthesia Preprocedure Evaluation (Addendum)
Anesthesia Evaluation  Patient identified by MRN, date of birth, ID band Patient awake    Reviewed: Allergy & Precautions, H&P , NPO status   Airway Mallampati: I   Neck ROM: Full    Dental  (+) Edentulous Upper   Pulmonary shortness of breath, asthma ,  breath sounds clear to auscultation        Cardiovascular hypertension, Pt. on medications Rhythm:Regular Rate:Normal  Cardiac cath 7/11 LAD 30 %, EF 55-60%   Neuro/Psych    GI/Hepatic negative GI ROS, Neg liver ROS,   Endo/Other  negative endocrine ROS  Renal/GU negative Renal ROS     Musculoskeletal   Abdominal   Peds  Hematology   Anesthesia Other Findings   Reproductive/Obstetrics                            Anesthesia Physical Anesthesia Plan  ASA: II  Anesthesia Plan: General   Post-op Pain Management:    Induction: Intravenous  Airway Management Planned: LMA  Additional Equipment:   Intra-op Plan:   Post-operative Plan: Extubation in OR  Informed Consent: I have reviewed the patients History and Physical, chart, labs and discussed the procedure including the risks, benefits and alternatives for the proposed anesthesia with the patient or authorized representative who has indicated his/her understanding and acceptance.     Plan Discussed with: CRNA and Surgeon  Anesthesia Plan Comments:         Anesthesia Quick Evaluation

## 2014-06-15 NOTE — Interval H&P Note (Signed)
History and Physical Interval Note:  06/15/2014 7:30 AM  Benjamin Hamilton  has presented today for surgery, with the diagnosis of LEFT INTRA-ARTICULAR DISTAL RADIAL FRACTURE  The various methods of treatment have been discussed with the patient and family. After consideration of risks, benefits and other options for treatment, the patient has consented to  Procedure(s) with comments: OPEN REDUCTION INTERNAL FIXATION (ORIF) LEFT DISTAL RADIAL FRACTURE (Left) - ANESTHESIA:  GENERAL/INTERSCALENE BLOCK as a surgical intervention .  The patient's history has been reviewed, patient examined, no change in status, stable for surgery.  I have reviewed the patient's chart and labs.  Questions were answered to the patient's satisfaction.     Corrie Brannen A.

## 2014-06-15 NOTE — Transfer of Care (Signed)
Immediate Anesthesia Transfer of Care Note  Patient: Benjamin Hamilton  Procedure(s) Performed: Procedure(s) with comments: OPEN REDUCTION INTERNAL FIXATION (ORIF) LEFT DISTAL RADIAL FRACTURE (Left) - ANESTHESIA:  GENERAL/INTERSCALENE BLOCK  Patient Location: PACU  Anesthesia Type:GA combined with regional for post-op pain  Level of Consciousness: awake, alert , oriented and patient cooperative  Airway & Oxygen Therapy: Patient Spontanous Breathing and Patient connected to face mask oxygen  Post-op Assessment: Report given to PACU RN and Post -op Vital signs reviewed and stable  Post vital signs: Reviewed and stable  Complications: No apparent anesthesia complications

## 2014-06-15 NOTE — Anesthesia Postprocedure Evaluation (Signed)
  Anesthesia Post-op Note  Patient: Benjamin Hamilton  Procedure(s) Performed: Procedure(s) with comments: OPEN REDUCTION INTERNAL FIXATION (ORIF) LEFT DISTAL RADIAL FRACTURE (Left) - ANESTHESIA:  GENERAL/INTERSCALENE BLOCK  Patient Location: PACU  Anesthesia Type:GA combined with regional for post-op pain  Level of Consciousness: awake and alert   Airway and Oxygen Therapy: Patient Spontanous Breathing  Post-op Pain: mild  Post-op Assessment: Post-op Vital signs reviewed  Post-op Vital Signs: stable  Last Vitals:  Filed Vitals:   06/15/14 0930  BP: 166/95  Pulse: 82  Temp:   Resp: 21    Complications: No apparent anesthesia complications

## 2014-06-15 NOTE — Anesthesia Procedure Notes (Addendum)
Anesthesia Regional Block:  Supraclavicular block  Pre-Anesthetic Checklist: ,, timeout performed, Correct Patient, Correct Site, Correct Laterality, Correct Procedure, Correct Position, site marked, Risks and benefits discussed,  Surgical consent,  Pre-op evaluation,  At surgeon's request and post-op pain management  Laterality: Left and Upper  Prep: chloraprep       Needles:   Needle Type: Echogenic Needle     Needle Length: 9cm 9 cm Needle Gauge: 21 and 21 G  Needle insertion depth: 6 cm   Additional Needles:  Procedures: ultrasound guided (picture in chart) and nerve stimulator Supraclavicular block Narrative:  Start time: 06/15/2014 6:55 AM End time: 06/15/2014 7:10 AM Injection made incrementally with aspirations every 5 mL.  Performed by: Personally  Anesthesiologist: MASSAGEE, TERRY  Additional Notes: Tolerated well   Procedure Name: LMA Insertion Date/Time: 06/15/2014 7:38 AM Performed by: Donnamae Muilenburg Pre-anesthesia Checklist: Patient identified, Emergency Drugs available, Suction available and Patient being monitored Patient Re-evaluated:Patient Re-evaluated prior to inductionOxygen Delivery Method: Circle System Utilized Preoxygenation: Pre-oxygenation with 100% oxygen Intubation Type: IV induction Ventilation: Mask ventilation without difficulty LMA: LMA inserted LMA Size: 5.0 Number of attempts: 1 Airway Equipment and Method: bite block Placement Confirmation: positive ETCO2 Tube secured with: Tape Dental Injury: Teeth and Oropharynx as per pre-operative assessment

## 2014-06-15 NOTE — Progress Notes (Signed)
Assisted Dr. Massagee with left, ultrasound guided, supraclavicular block. Side rails up, monitors on throughout procedure. See vital signs in flow sheet. Tolerated Procedure well. 

## 2014-06-15 NOTE — Op Note (Addendum)
06/15/2014  7:32 AM  PATIENT:  Benjamin Hamilton  63 y.o. male  PRE-OPERATIVE DIAGNOSIS:  Displaced left intra-articular comminuted distal radius fracture  POST-OPERATIVE DIAGNOSIS:  Same  PROCEDURE:  ORIF left intra-articular comminuted distal radius fracture (93818)  SURGEON: Rayvon Char. Grandville Silos, MD  PHYSICIAN ASSISTANT: Morley Kos, OPA-C  ANESTHESIA:  regional and general  SPECIMENS:  None  DRAINS:   None  PREOPERATIVE INDICATIONS:  Benjamin Hamilton is a  63 y.o. male with displaced comminuted left intra-articular distal radius fracture.  The risks benefits and alternatives were discussed with the patient preoperatively including but not limited to the risks of infection, bleeding, nerve injury, cardiopulmonary complications, the need for revision surgery, among others, and the patient verbalized understanding and consented to proceed.  OPERATIVE IMPLANTS: Skeletal dynamics standard left plate and screws  OPERATIVE PROCEDURE: After receiving prophylactic antibiotics and a regional block, the patient was escorted to the operative theatre and placed in a supine position. General anesthesia was administered.  A surgical "time-out" was performed during which the planned procedure, proposed operative site, and the correct patient identity were compared to the operative consent and agreement confirmed by the circulating nurse according to current facility policy. Following application of a tourniquet to the operative extremity, the exposed skin was pre-scrub with Hibiclens scrub brush and then was prepped with Chloraprep and draped in the usual sterile fashion. The limb was exsanguinated with an Esmarch bandage and the tourniquet inflated to approximately 198mmHg higher than systolic BP.   A sinusoidal-shaped incision was marked and made over the FCR axis and the distal forearm. The skin was incised sharply with scalpel, subcutaneous tissues with blunt and spreading dissection. The FCR  axis was exploited deeply. The pronator quadratus was reflected in an L-shaped ulnarly and the brachioradialis was split in a Z-plasty fashion for later reapproximation. The fracture was inspected and provisionally reduced.  This was confirmed fluoroscopically. The appropriately sized plate was selected and found to fit well. It was placed in its provisional alignment of the radius and this was confirmed fluoroscopically.  It was secured to the radius with a screw through the slotted hole.  Additional adjustments were made as necessary, and the distal holes were all drilled and filled.  Peg/screw length distally was selected on the shorter side of measurements to minimize the risk for dorsal cortical penetration. The remainder of the proximal holes were drilled and filled.   Final images were obtained and the DRUJ was examined for stability. It was found to be sufficiently stable. The wound was then copiously irrigated and the brachioradialis repaired with 2-0 Vicryl Rapide suture followed by repair of the pronator quadratus with the same suture type. Tourniquet was released and additional hemostasis obtained and the skin was closed with 2-0 Vicryl deep dermal buried sutures followed by running 4-0 Vicryl Rapide horizontal mattress suture in the skin. A bulky dressing with a volar plaster component was applied and she was taken to room stable condition.  DISPOSITION: The patient will be discharged home today with typical post-op instructions, returning in 10-15 days for reevaluation with new x-rays of the affected wrist out of the splint to include an inclined lateral and then transition to therapy to have a custom splint constructed and begin rehabilitation.

## 2014-06-15 NOTE — Discharge Instructions (Signed)
Discharge Instructions ° ° °You have a dressing with a plaster splint incorporated in it. °Move your fingers as much as possible, making a full fist and fully opening the fist. °Elevate your hand to reduce pain & swelling of the digits.  Ice over the operative site may be helpful to reduce pain & swelling.  DO NOT USE HEAT. °Pain medicine has been prescribed for you.  °Use your medicine as needed over the first 48 hours, and then you can begin to taper your use.  You may use Tylenol in place of your prescribed pain medication, but not IN ADDITION to it. °Leave the dressing in place until you return to our office.  °You may shower, but keep the bandage clean & dry.  °You may drive a car when you are off of prescription pain medications and can safely control your vehicle with both hands. °Our office will call you to arrange follow-up ° ° °Please call [phone] during normal business hours or [phone] after hours for any problems. Including the following: ° °- excessive redness of the incisions °- drainage for more than 4 days °- fever of more than 101.5 F ° °*Please note that pain medications will not be refilled after hours or on weekends. ° ° °Post Anesthesia Home Care Instructions ° °Activity: °Get plenty of rest for the remainder of the day. A responsible adult should stay with you for 24 hours following the procedure.  °For the next 24 hours, DO NOT: °-Drive a car °-Operate machinery °-Drink alcoholic beverages °-Take any medication unless instructed by your physician °-Make any legal decisions or sign important papers. ° °Meals: °Start with liquid foods such as gelatin or soup. Progress to regular foods as tolerated. Avoid greasy, spicy, heavy foods. If nausea and/or vomiting occur, drink only clear liquids until the nausea and/or vomiting subsides. Call your physician if vomiting continues. ° °Special Instructions/Symptoms: °Your throat may feel dry or sore from the anesthesia or the breathing tube  placed in your throat during surgery. If this causes discomfort, gargle with warm salt water. The discomfort should disappear within 24 hours. ° °Regional Anesthesia Blocks ° °1. Numbness or the inability to move the "blocked" extremity may last from 3-48 hours after placement. The length of time depends on the medication injected and your individual response to the medication. If the numbness is not going away after 48 hours, call your surgeon. ° °2. The extremity that is blocked will need to be protected until the numbness is gone and the  Strength has returned. Because you cannot feel it, you will need to take extra care to avoid injury. Because it may be weak, you may have difficulty moving it or using it. You may not know what position it is in without looking at it while the block is in effect. ° °3. For blocks in the legs and feet, returning to weight bearing and walking needs to be done carefully. You will need to wait until the numbness is entirely gone and the strength has returned. You should be able to move your leg and foot normally before you try and bear weight or walk. You will need someone to be with you when you first try to ensure you do not fall and possibly risk injury. ° °4. Bruising and tenderness at the needle site are common side effects and will resolve in a few days. ° °5. Persistent numbness or new problems with movement should be communicated to the surgeon or the [facility] [phone] [place] [phone]).

## 2014-06-16 ENCOUNTER — Encounter (HOSPITAL_BASED_OUTPATIENT_CLINIC_OR_DEPARTMENT_OTHER): Payer: Self-pay | Admitting: Orthopedic Surgery

## 2014-06-18 ENCOUNTER — Encounter (HOSPITAL_BASED_OUTPATIENT_CLINIC_OR_DEPARTMENT_OTHER): Payer: Self-pay | Admitting: Orthopedic Surgery

## 2016-05-01 ENCOUNTER — Ambulatory Visit (INDEPENDENT_AMBULATORY_CARE_PROVIDER_SITE_OTHER): Payer: Self-pay | Admitting: Internal Medicine

## 2016-05-01 ENCOUNTER — Encounter: Payer: Self-pay | Admitting: Internal Medicine

## 2016-05-01 ENCOUNTER — Ambulatory Visit: Payer: 59

## 2016-05-01 VITALS — BP 165/110 | Temp 98.7°F | Wt 262.3 lb

## 2016-05-01 DIAGNOSIS — Z0000 Encounter for general adult medical examination without abnormal findings: Secondary | ICD-10-CM

## 2016-05-01 DIAGNOSIS — J45909 Unspecified asthma, uncomplicated: Secondary | ICD-10-CM

## 2016-05-01 DIAGNOSIS — I10 Essential (primary) hypertension: Secondary | ICD-10-CM

## 2016-05-01 DIAGNOSIS — Z79899 Other long term (current) drug therapy: Secondary | ICD-10-CM

## 2016-05-01 LAB — POCT GLYCOSYLATED HEMOGLOBIN (HGB A1C): Hemoglobin A1C: 6.3

## 2016-05-01 LAB — GLUCOSE, CAPILLARY: Glucose-Capillary: 100 mg/dL — ABNORMAL HIGH (ref 65–99)

## 2016-05-01 MED ORDER — AMLODIPINE BESYLATE 10 MG PO TABS: 10.0000 mg | ORAL_TABLET | Freq: Every day | ORAL | 3 refills | Status: DC

## 2016-05-01 MED ORDER — ATORVASTATIN CALCIUM 40 MG PO TABS: 40.0000 mg | ORAL_TABLET | Freq: Every day | ORAL | 11 refills | Status: DC

## 2016-05-01 MED ORDER — BUDESONIDE-FORMOTEROL FUMARATE 80-4.5 MCG/ACT IN AERO: 2.0000 | INHALATION_SPRAY | Freq: Two times a day (BID) | RESPIRATORY_TRACT | 12 refills | Status: DC

## 2016-05-01 MED ORDER — ALBUTEROL SULFATE HFA 108 (90 BASE) MCG/ACT IN AERS: 2.0000 | INHALATION_SPRAY | RESPIRATORY_TRACT | 3 refills | Status: DC | PRN

## 2016-05-01 MED ORDER — MOMETASONE FURO-FORMOTEROL FUM 100-5 MCG/ACT IN AERO: 2.0000 | INHALATION_SPRAY | Freq: Two times a day (BID) | RESPIRATORY_TRACT | 5 refills | Status: DC

## 2016-05-01 NOTE — Progress Notes (Signed)
   CC: high blood pressure  HPI:  Benjamin.Benjamin Hamilton is a 65 y.o. man with history of HTN who presents for management of hypertension and to establish care with the clinic.  Benjamin Hamilton has not seen a doctor since he retired last year.  Previously, he was followed by Dr Iona Beard.  He has not had any meds since running out months ago.  Recalls previously taking a 10 mg pill, perhaps amlodipine, for HTN and using an inhaler every day for asthma.  Cannot remember taking 2 antihypertensives at once.  Currently has no inhalers, and wakes up at night 2-3x per month wheezing.  Retired last year, previously worked in a Engineer, technical sales.  Lives alone in apartment.  Sister lives nearby.  Walks 1 mile every morning.  Gained 10 lbs in past year.  Past Medical History:  Diagnosis Date  . Asthma   . Chest pain    a. cath 7/11: dLAD 30%, EF 55-60% (done after abnl Myoview with 5 bts polymorphic VT)  . Hypercholesteremia   . Hypertension   . SOB (shortness of breath)    a. PFTs ok; b. Chest CT 11/11 with bronchial wall thickening, no mass or parenchymal disease;  c. seen by Dr. Gwenette Greet - ? environment asthma vs. GERD (PPI no help)  . Wears dentures    top    Review of Systems:   Review of Systems  Constitutional: Negative for chills, fever and weight loss.  Eyes: Negative for blurred vision and double vision.  Respiratory: Positive for wheezing. Negative for cough and shortness of breath.   Cardiovascular: Negative for chest pain, orthopnea, leg swelling and PND.  Gastrointestinal: Negative for abdominal pain, blood in stool, constipation, diarrhea, melena, nausea and vomiting.  Genitourinary: Negative for dysuria and hematuria.  Musculoskeletal: Negative for back pain and myalgias.  Neurological: Negative for sensory change, focal weakness and headaches.  Psychiatric/Behavioral: Negative for depression. The patient is not nervous/anxious.     Physical Exam:  Vitals:   05/01/16 0822  BP:  (!) 165/110  Temp: 98.7 F (37.1 C)  TempSrc: Oral  SpO2: 99%  Weight: 262 lb 4.8 oz (119 kg)   Physical Exam  Constitutional: He is oriented to person, place, and time. He appears well-developed and well-nourished. No distress.  Eyes: Conjunctivae are normal. Pupils are equal, round, and reactive to light. No scleral icterus.  Cardiovascular:  RRR, 2/6 holosystolic murmur Radial and DP pulses 3+ symmetric  Pulmonary/Chest: Effort normal and breath sounds normal. He has no wheezes.  Abdominal: Soft. He exhibits no distension. There is no tenderness.  Small umbilical hernia ~1" diameter, not fully reducible, slightly tender  Musculoskeletal: He exhibits no tenderness.  Trace bilateral LE edema  Neurological: He is alert and oriented to person, place, and time.  Skin: Skin is warm and dry.  Psychiatric: He has a normal mood and affect. His behavior is normal.    Assessment & Plan:   See Encounters Tab for problem based charting.  Patient seen with Dr. Eppie Gibson

## 2016-05-01 NOTE — Assessment & Plan Note (Signed)
Needs CRC screening, currently uninsured.  Will have him talk to [facility] and then reassess colonoscopy vs FOBT at next visit.  Declines flu shot.  ASCVD 22.7% -High intensity statin for primary prevention w/ atorvastatin 40 mg daily

## 2016-05-01 NOTE — Patient Instructions (Addendum)
It was a pleasure to meet you today Benjamin Hamilton!  We talked about your high blood pressure and asthma.  I have started you on several medications; please review your medication list carefully.  I have sent prescriptions to the [facility] because it will be cheaper for you than [facility].  For your hypertension, please start taking Amlodipine 10mg  once daily.  We will see you back in clinic in 1 month to check on your blood pressure again.  For your asthma, I have prescribed 2 inhalers for one.  The Symbicort is to use twice daily every day.  The Albuterol is only to use if you notice your breathing getting worse or wheezing.  To reduce your chance of heart attacks, I have prescribed Atorvastatin 40 mg to take every day.  Also, please take a baby aspirin 81 mg daily.  You habit of walking every morning is great - keep it up and try to walk 2 miles instead of 1 to lose some weight!

## 2016-05-01 NOTE — Assessment & Plan Note (Addendum)
Documented history of asthma.  With 3+ nighttime awakenings and wheezing per month, consistent with mild persistent asthma.  He was previously maintained on Dulera with good effect per pulmonology notes. -Resume Step 3 therapy with Dulera -Refill albuterol -Teach regarding controller and rescue medications

## 2016-05-01 NOTE — Assessment & Plan Note (Signed)
BP Readings from Last 3 Encounters:  05/01/16 (!) 165/110  06/15/14 (!) 158/81  06/06/14 115/70   Lab Results  Component Value Date   CREATININE 0.86 06/10/2014   Lab Results  Component Value Date   K 4.6 06/10/2014   Current medications: none  Assessment BP goal: 140/90 BP control: uncontrolled  Recalls previously taking amlodipine, chart suggests he may have been on dual antihypertensives with amlodipine and losartan.  His BPs are high enough to consider starting multiple antihypertensives today, but since he does not recall taking multiple agents I will start with a single med and plan for close follow-up.  Plan Medications: start amlodipine 10 mg daily Other: RTC 1 month, assess need for second agent Weight loss, increase exercise

## 2016-05-02 ENCOUNTER — Ambulatory Visit: Payer: Self-pay

## 2016-05-02 ENCOUNTER — Encounter: Payer: Self-pay | Admitting: Internal Medicine

## 2016-05-02 LAB — BMP8+ANION GAP
ANION GAP: 18 mmol/L (ref 10.0–18.0)
BUN/Creatinine Ratio: 14 (ref 10–24)
BUN: 13 mg/dL (ref 8–27)
CALCIUM: 9.2 mg/dL (ref 8.6–10.2)
CHLORIDE: 95 mmol/L — AB (ref 96–106)
CO2: 22 mmol/L (ref 18–29)
Creatinine, Ser: 0.92 mg/dL (ref 0.76–1.27)
GFR calc Af Amer: 102 mL/min/{1.73_m2} (ref >59)
GFR calc non Af Amer: 88 mL/min/{1.73_m2} (ref >59)
GLUCOSE: 104 mg/dL — AB (ref 65–99)
POTASSIUM: 4.1 mmol/L (ref 3.5–5.2)
Sodium: 135 mmol/L (ref 134–144)

## 2016-05-14 NOTE — Progress Notes (Signed)
I saw and evaluated the patient.  I personally confirmed the key portions of Dr. Rowe Pavy history and exam and reviewed pertinent patient test results.  The assessment, diagnosis, and plan were formulated together and I agree with the documentation in the resident's note.

## 2016-06-30 MED FILL — AMLODIPINE BESYLATE 10 MG T: 10 | 30 days supply | Qty: 30 | Fill #0

## 2016-09-12 MED FILL — AMLODIPINE BESYLATE 10 MG T: 10 | 30 days supply | Qty: 30 | Fill #1

## 2016-10-02 ENCOUNTER — Ambulatory Visit (INDEPENDENT_AMBULATORY_CARE_PROVIDER_SITE_OTHER): Payer: Self-pay | Admitting: Emergency Medicine

## 2016-10-02 VITALS — BP 146/85 | HR 80 | Temp 97.7°F | Resp 16 | Ht 72.0 in | Wt 279.0 lb

## 2016-10-02 DIAGNOSIS — IMO0001 Reserved for inherently not codable concepts without codable children: Secondary | ICD-10-CM | POA: Insufficient documentation

## 2016-10-02 DIAGNOSIS — H6123 Impacted cerumen, bilateral: Secondary | ICD-10-CM | POA: Insufficient documentation

## 2016-10-02 DIAGNOSIS — H918X1 Other specified hearing loss, right ear: Secondary | ICD-10-CM

## 2016-10-02 DIAGNOSIS — I10 Essential (primary) hypertension: Secondary | ICD-10-CM

## 2016-10-02 DIAGNOSIS — H9190 Unspecified hearing loss, unspecified ear: Secondary | ICD-10-CM | POA: Insufficient documentation

## 2016-10-02 NOTE — Progress Notes (Signed)
Benjamin Hamilton 66 y.o.   Chief Complaint  Patient presents with  . ear blocked    right    HISTORY OF PRESENT ILLNESS: This is a 66 y.o. male complaining of right ear blocked/stuffed with decreased hearing x several days. No other significant symptoms.  HPI   Prior to Admission medications   Medication Sig Start Date End Date Taking? Authorizing Provider  albuterol (VENTOLIN HFA) 108 (90 Base) MCG/ACT inhaler Inhale 2 puffs into the lungs every 4 (four) hours as needed for wheezing or shortness of breath. 05/01/16  Yes Minus Liberty, MD  amLODipine (NORVASC) 10 MG tablet Take 1 tablet (10 mg total) by mouth daily. 05/01/16  Yes Minus Liberty, MD  aspirin EC 81 MG tablet Take 1 tablet (81 mg total) by mouth daily. 02/14/13  Yes Scott Joylene Draft, PA-C  atorvastatin (LIPITOR) 40 MG tablet Take 1 tablet (40 mg total) by mouth daily. 05/01/16  Yes Minus Liberty, MD  mometasone-formoterol [facility]) 100-5 MCG/ACT AERO Inhale 2 puffs into the lungs 2 (two) times daily. 05/01/16  Yes Minus Liberty, MD    No Known Allergies  Patient Active Problem List   Diagnosis Date Noted  . Healthcare maintenance 05/01/2016  . Allergic rhinitis 11/21/2012  . 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 Essential hypertension 10/19/2009  . Asthma, intrinsic 10/19/2009  . CHEST PAIN 10/19/2009    Past Medical History:  Diagnosis Date  . Asthma   . Chest pain    a. cath 7/11: dLAD 30%, EF 55-60% (done after abnl Myoview with 5 bts polymorphic VT)  . Hypercholesteremia   . Hypertension   . SOB (shortness of breath)    a. PFTs ok; b. Chest CT 11/11 with bronchial wall thickening, no mass or parenchymal disease;  c. seen by Dr. Gwenette Greet - ? environment asthma vs. GERD (PPI no help)  . Wears dentures    top    Past Surgical History:  Procedure Laterality Date  . CARDIAC CATHETERIZATION  2011  . HERNIA REPAIR  1988   right ing   . OPEN REDUCTION INTERNAL FIXATION (ORIF)  DISTAL RADIAL FRACTURE Left 06/15/2014   Procedure: OPEN REDUCTION INTERNAL FIXATION (ORIF) LEFT DISTAL RADIAL FRACTURE;  Surgeon: Jolyn Nap, MD;  Location: [facility];  Service: Orthopedics;  Laterality: Left;  ANESTHESIA:  GENERAL/INTERSCALENE BLOCK    Social History   Social History  . Marital status: Single    Spouse name: N/A  . Number of children: N/A  . Years of education: N/A   Occupational History  . Retired     in 2016   Social History Main Topics  . Smoking status: Never Smoker  . Smokeless tobacco: Never Used  . Alcohol use 3.6 oz/week    6 Cans of beer per week     Comment: msotly on weekends  . Drug use: No  . Sexual activity: Not on file   Other Topics Concern  . Not on file   Social History Narrative  . No narrative on file    Family History  Problem Relation Age of Onset  . Asthma Mother   . Diabetes Mother   . Hypertension Mother   . Heart attack Father 23  . Hypertension Father      Review of Systems  Constitutional: Negative.  Negative for chills, fever and weight loss.  HENT: Positive for congestion and hearing loss. Negative for ear discharge, ear pain, nosebleeds, sore throat and tinnitus.   Eyes: Negative.  Negative  for blurred vision, double vision, discharge and redness.  Respiratory: Negative.  Negative for cough, hemoptysis and shortness of breath.   Cardiovascular: Negative.  Negative for chest pain, palpitations and orthopnea.  Gastrointestinal: Negative.  Negative for abdominal pain, diarrhea, nausea and vomiting.  Genitourinary: Negative.  Negative for dysuria and hematuria.  Musculoskeletal: Positive for back pain (ocassional). Negative for neck pain.  Skin: Negative.  Negative for rash.  Neurological: Negative.  Negative for dizziness, sensory change and headaches.  Endo/Heme/Allergies: Negative.   Psychiatric/Behavioral: Negative.   All other systems reviewed and are negative.   Vitals:   10/02/16 1336   BP: (!) 146/85  Pulse: 80  Resp: 16  Temp: 97.7 F (36.5 C)    Physical Exam  Constitutional: He appears well-developed and well-nourished.  HENT:  Head: Normocephalic and atraumatic.  Right Ear: External ear normal.  Left Ear: External ear normal.  Mouth/Throat: Oropharynx is clear and moist.  Both ears impacted with cerumen.  Eyes: Conjunctivae and EOM are normal. Pupils are equal, round, and reactive to light.  Neck: Normal range of motion. Neck supple. No JVD present. No thyromegaly present.  Cardiovascular: Normal rate, regular rhythm and normal heart sounds.   Pulmonary/Chest: Effort normal and breath sounds normal. No respiratory distress.  Abdominal: Soft. Bowel sounds are normal. He exhibits no distension. There is no tenderness.  Musculoskeletal: Normal range of motion.  Lymphadenopathy:    He has no cervical adenopathy.  Neurological: He is alert. No sensory deficit. He exhibits normal muscle tone.  Skin: Skin is warm and dry. Capillary refill takes less than 2 seconds.  Psychiatric: He has a normal mood and affect. His behavior is normal.  Vitals reviewed.    ASSESSMENT & PLAN: Nestor was seen today for ear blocked.  Diagnoses and all orders for this visit:  Excessive ear wax, bilateral -     Ear wax removal  Other specified hearing loss of right ear, unspecified hearing status on contralateral side -     Ear wax removal  Essential hypertension    Patient Instructions       IF you received an x-ray today, you will receive an invoice from [facility]. Please contact [facility] at [phone] with questions or concerns regarding your invoice.   IF you received labwork today, you will receive an invoice from [facility]. Please contact [facility] at [phone] with questions or concerns regarding your invoice.   Our billing staff will not be able to assist you with questions regarding bills from these companies.  You will be  contacted with the lab results as soon as they are available. The fastest way to get your results is to activate your My Chart account. Instructions are located on the last page of this paperwork. If you have not heard from [place] regarding the results in 2 weeks, please contact this office.     Earwax Buildup Your ears make a substance called earwax. It may also be called cerumen. Sometimes, too much earwax builds up in your ear canal. This can cause ear pain and make it harder for you to hear. CAUSES This condition is caused by too much earwax production or buildup. RISK FACTORS The following factors may make you more likely to develop this condition:  Cleaning your ears often with swabs.  Having narrow ear canals.  Having earwax that is overly thick or sticky.  Having eczema.  Being dehydrated. SYMPTOMS Symptoms of this condition include:  Reduced hearing.  Ear drainage.  Ear pain.  Ear itch.  A feeling of fullness in the ear or feeling that the ear is plugged.  Ringing in the ear.  Coughing. DIAGNOSIS Your health care provider can diagnose this condition based on your symptoms and medical history. Your health care provider will also do an ear exam to look inside your ear with a scope (otoscope). You may also have a hearing test. TREATMENT Treatment for this condition includes:  Over-the-counter or prescription ear drops to soften the earwax.  Earwax removal by a health care provider. This may be done:  By flushing the ear with body-temperature water.  With a medical instrument that has a loop at the end (earwax curette).  With a suction device. HOME CARE INSTRUCTIONS  Take over-the-counter and prescription medicines only as told by your health care provider.  Do not put any objects, including an ear swab, into your ear. You can clean the opening of your ear canal with a washcloth.  Drink enough water to keep your urine clear or pale yellow.  If you have  frequent earwax buildup or you use hearing aids, consider seeing your health care provider every 6-12 months for routine preventive ear cleanings. Keep all follow-up visits as told by your health care provider. SEEK MEDICAL CARE IF:  You have ear pain.  Your condition does not improve with treatment.  You have hearing loss.  You have blood, pus, or other fluid coming from your ear. This information is not intended to replace advice given to you by your health care provider. Make sure you discuss any questions you have with your health care provider. Document Released: 08/10/2004 Document Revised: 10/25/2015 Document Reviewed: 02/17/2015 Elsevier Interactive Patient Education  2017 [employer]     Agustina Caroli, MD [facility]

## 2016-10-02 NOTE — Patient Instructions (Addendum)
     IF you received an x-ray today, you will receive an invoice from [facility]. Please contact [facility] at [phone] with questions or concerns regarding your invoice.   IF you received labwork today, you will receive an invoice from [facility]. Please contact [facility] at [phone] with questions or concerns regarding your invoice.   Our billing staff will not be able to assist you with questions regarding bills from these companies.  You will be contacted with the lab results as soon as they are available. The fastest way to get your results is to activate your My Chart account. Instructions are located on the last page of this paperwork. If you have not heard from us regarding the results in 2 weeks, please contact this office.     Earwax Buildup Your ears make a substance called earwax. It may also be called cerumen. Sometimes, too much earwax builds up in your ear canal. This can cause ear pain and make it harder for you to hear. CAUSES This condition is caused by too much earwax production or buildup. RISK FACTORS The following factors may make you more likely to develop this condition:  Cleaning your ears often with swabs.  Having narrow ear canals.  Having earwax that is overly thick or sticky.  Having eczema.  Being dehydrated. SYMPTOMS Symptoms of this condition include:  Reduced hearing.  Ear drainage.  Ear pain.  Ear itch.  A feeling of fullness in the ear or feeling that the ear is plugged.  Ringing in the ear.  Coughing. DIAGNOSIS Your health care provider can diagnose this condition based on your symptoms and medical history. Your health care provider will also do an ear exam to look inside your ear with a scope (otoscope). You may also have a hearing test. TREATMENT Treatment for this condition includes:  Over-the-counter or prescription ear drops to soften the earwax.  Earwax removal by a health care provider. This may be  done:  By flushing the ear with body-temperature water.  With a medical instrument that has a loop at the end (earwax curette).  With a suction device. HOME CARE INSTRUCTIONS  Take over-the-counter and prescription medicines only as told by your health care provider.  Do not put any objects, including an ear swab, into your ear. You can clean the opening of your ear canal with a washcloth.  Drink enough water to keep your urine clear or pale yellow.  If you have frequent earwax buildup or you use hearing aids, consider seeing your health care provider every 6-12 months for routine preventive ear cleanings. Keep all follow-up visits as told by your health care provider. SEEK MEDICAL CARE IF:  You have ear pain.  Your condition does not improve with treatment.  You have hearing loss.  You have blood, pus, or other fluid coming from your ear. This information is not intended to replace advice given to you by your health care provider. Make sure you discuss any questions you have with your health care provider. Document Released: 08/10/2004 Document Revised: 10/25/2015 Document Reviewed: 02/17/2015 Elsevier Interactive Patient Education  2017 [employer]   

## 2016-10-24 MED FILL — AMLODIPINE BESYLATE 10 MG T: 10 | 30 days supply | Qty: 30 | Fill #2

## 2016-11-29 ENCOUNTER — Other Ambulatory Visit: Payer: Self-pay | Admitting: Internal Medicine

## 2016-11-29 DIAGNOSIS — I10 Essential (primary) hypertension: Secondary | ICD-10-CM

## 2016-11-29 MED FILL — AMLODIPINE BESYLATE 10 MG T: 10 | 30 days supply | Qty: 30 | Fill #3

## 2017-01-01 ENCOUNTER — Emergency Department (HOSPITAL_COMMUNITY)
Admission: EM | Admit: 2017-01-01 | Discharge: 2017-01-01 | Disposition: A | Discharge status: Home / Self Care | Payer: Self-pay | Attending: Emergency Medicine | Admitting: Emergency Medicine

## 2017-01-01 DIAGNOSIS — I10 Essential (primary) hypertension: Secondary | ICD-10-CM | POA: Insufficient documentation

## 2017-01-01 DIAGNOSIS — Z79899 Other long term (current) drug therapy: Secondary | ICD-10-CM | POA: Insufficient documentation

## 2017-01-01 DIAGNOSIS — J45909 Unspecified asthma, uncomplicated: Secondary | ICD-10-CM | POA: Insufficient documentation

## 2017-01-01 DIAGNOSIS — Z7982 Long term (current) use of aspirin: Secondary | ICD-10-CM | POA: Insufficient documentation

## 2017-01-01 DIAGNOSIS — Z760 Encounter for issue of repeat prescription: Secondary | ICD-10-CM | POA: Insufficient documentation

## 2017-01-01 DIAGNOSIS — Z955 Presence of coronary angioplasty implant and graft: Secondary | ICD-10-CM | POA: Insufficient documentation

## 2017-01-01 MED ORDER — AMLODIPINE BESYLATE 10 MG PO TABS: 10.0000 mg | ORAL_TABLET | Freq: Every day | ORAL | 1 refills | Status: DC

## 2017-01-01 MED FILL — AMLODIPINE BESYLATE 10 MG T: 10 | 30 days supply | Qty: 30 | Fill #0

## 2017-01-01 NOTE — ED Notes (Signed)
Bed: [facility]PT4 Expected date:  Expected time:  Means of arrival:  Comments: 

## 2017-01-01 NOTE — ED Triage Notes (Signed)
Pt reports that he needs a refill of his Amlodipine Rx. Pt A+OX4, speaking in complete sentences, denies pain, ambulatory to triage.

## 2017-01-01 NOTE — ED Provider Notes (Signed)
[facility] DEPT Provider Note   CSN: [id number] Arrival date & time: 01/01/17  0815     History   Chief Complaint Chief Complaint  Patient presents with  . Medication Refill    HPI Benjamin Hamilton is a 66 y.o. male.  HPI Patient is currently without a primary care physician.  He is requesting a refill of his blood pressure medication.  He has no chest pain shortness of breath.  No other complaints.  He is on 10 mg daily and amlodipine.    Past Medical History:  Diagnosis Date  . Asthma   . Chest pain    a. cath 7/11: dLAD 30%, EF 55-60% (done after abnl Myoview with 5 bts polymorphic VT)  . Hypercholesteremia   . Hypertension   . SOB (shortness of breath)    a. PFTs ok; b. Chest CT 11/11 with bronchial wall thickening, no mass or parenchymal disease;  c. seen by Dr. Gwenette Greet - ? environment asthma vs. GERD (PPI no help)  . Wears dentures    top    Patient Active Problem List   Diagnosis Date Noted  . Excessive ear wax, bilateral 10/02/2016  . Hearing impairment 10/02/2016  . Healthcare maintenance 05/01/2016  . Allergic rhinitis 11/21/2012  . 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 Essential hypertension 10/19/2009  . Asthma, intrinsic 10/19/2009  . CHEST PAIN 10/19/2009    Past Surgical History:  Procedure Laterality Date  . CARDIAC CATHETERIZATION  2011  . HERNIA REPAIR  1988   right ing   . OPEN REDUCTION INTERNAL FIXATION (ORIF) DISTAL RADIAL FRACTURE Left 06/15/2014   Procedure: OPEN REDUCTION INTERNAL FIXATION (ORIF) LEFT DISTAL RADIAL FRACTURE;  Surgeon: Jolyn Nap, MD;  Location: [facility];  Service: Orthopedics;  Laterality: Left;  ANESTHESIA:  GENERAL/INTERSCALENE BLOCK       Home Medications    Prior to Admission medications   Medication Sig Start Date End Date Taking? Authorizing Provider  albuterol (VENTOLIN HFA) 108 (90 Base) MCG/ACT inhaler Inhale 2 puffs into the lungs every 4 (four) hours as needed for  wheezing or shortness of breath. 05/01/16   Minus Liberty, MD  amLODipine (NORVASC) 10 MG tablet Take 1 tablet (10 mg total) by mouth daily. 01/01/17   Jola Schmidt, MD  aspirin EC 81 MG tablet Take 1 tablet (81 mg total) by mouth daily. 02/14/13   Richardson Dopp T, PA-C  atorvastatin (LIPITOR) 40 MG tablet Take 1 tablet (40 mg total) by mouth daily. 05/01/16   Minus Liberty, MD  mometasone-formoterol (DULERA) 100-5 MCG/ACT AERO Inhale 2 puffs into the lungs 2 (two) times daily. 05/01/16   Minus Liberty, MD    Family History Family History  Problem Relation Age of Onset  . Asthma Mother   . Diabetes Mother   . Hypertension Mother   . Heart attack Father 43  . Hypertension Father     Social History Social History  Substance Use Topics  . Smoking status: Never Smoker  . Smokeless tobacco: Never Used  . Alcohol use 3.6 oz/week    6 Cans of beer per week     Comment: msotly on weekends     Allergies   Patient has no known allergies.   Review of Systems Review of Systems  All other systems reviewed and are negative.    Physical Exam Updated Vital Signs BP (!) 151/80 (BP Location: Left Arm)   Pulse 79   Temp 97.6 F (36.4 C) (Oral)   Resp 16  SpO2 99%   Physical Exam  Constitutional: He is oriented to person, place, and time. He appears well-developed and well-nourished.  HENT:  Head: Normocephalic.  Eyes: EOM are normal.  Neck: Normal range of motion.  Pulmonary/Chest: Effort normal.  Abdominal: He exhibits no distension.  Musculoskeletal: Normal range of motion.  Neurological: He is alert and oriented to person, place, and time.  Psychiatric: He has a normal mood and affect.  Nursing note and vitals reviewed.    ED Treatments / Results  Labs (all labs ordered are listed, but only abnormal results are displayed) Labs Reviewed - No data to display  EKG  EKG Interpretation None       Radiology No results  found.  Procedures Procedures (including critical care time)  Medications Ordered in ED Medications - No data to display   Initial Impression / Assessment and Plan / ED Course  I have reviewed the triage vital signs and the nursing notes.  Pertinent labs & imaging results that were available during my care of the patient were reviewed by me and considered in my medical decision making (see chart for details).     Resources given for primary care.  The pressure meds rewritten.   Final Clinical Impressions(s) / ED Diagnoses   Final diagnoses:  Medication refill    New Prescriptions Current Discharge Medication List       Jola Schmidt, MD 01/01/17 0830

## 2017-01-31 MED FILL — ?AMLODIPINE BESYLATE 10 MG: 10 | 30 days supply | Qty: 30 | Fill #1

## 2017-02-08 ENCOUNTER — Encounter (HOSPITAL_COMMUNITY): Payer: Self-pay | Admitting: Emergency Medicine

## 2017-02-08 ENCOUNTER — Emergency Department (HOSPITAL_COMMUNITY): Payer: Self-pay

## 2017-02-08 ENCOUNTER — Emergency Department (HOSPITAL_COMMUNITY)
Admission: EM | Admit: 2017-02-08 | Discharge: 2017-02-08 | Disposition: A | Discharge status: Home / Self Care | Payer: Self-pay | Attending: Emergency Medicine | Admitting: Emergency Medicine

## 2017-02-08 DIAGNOSIS — R0989 Other specified symptoms and signs involving the circulatory and respiratory systems: Secondary | ICD-10-CM | POA: Insufficient documentation

## 2017-02-08 DIAGNOSIS — R609 Edema, unspecified: Secondary | ICD-10-CM | POA: Insufficient documentation

## 2017-02-08 DIAGNOSIS — R0602 Shortness of breath: Secondary | ICD-10-CM | POA: Insufficient documentation

## 2017-02-08 DIAGNOSIS — J45909 Unspecified asthma, uncomplicated: Secondary | ICD-10-CM | POA: Insufficient documentation

## 2017-02-08 DIAGNOSIS — R2241 Localized swelling, mass and lump, right lower limb: Secondary | ICD-10-CM | POA: Insufficient documentation

## 2017-02-08 DIAGNOSIS — Z79899 Other long term (current) drug therapy: Secondary | ICD-10-CM | POA: Insufficient documentation

## 2017-02-08 DIAGNOSIS — I10 Essential (primary) hypertension: Secondary | ICD-10-CM | POA: Insufficient documentation

## 2017-02-08 LAB — COMPREHENSIVE METABOLIC PANEL
ALBUMIN: 4.1 g/dL (ref 3.5–5.0)
ALT: 25 U/L (ref 17–63)
AST: 25 U/L (ref 15–41)
Alkaline Phosphatase: 76 U/L (ref 38–126)
Anion gap: 10 (ref 5–15)
BILIRUBIN TOTAL: 0.9 mg/dL (ref 0.3–1.2)
BUN: 11 mg/dL (ref 6–20)
CHLORIDE: 104 mmol/L (ref 101–111)
CO2: 25 mmol/L (ref 22–32)
Calcium: 9 mg/dL (ref 8.9–10.3)
Creatinine, Ser: 1.03 mg/dL (ref 0.61–1.24)
GFR calc Af Amer: >60 mL/min (ref >60)
GFR calc non Af Amer: >60 mL/min (ref >60)
GLUCOSE: 96 mg/dL (ref 65–99)
POTASSIUM: 3.6 mmol/L (ref 3.5–5.1)
Sodium: 139 mmol/L (ref 135–145)
TOTAL PROTEIN: 7.8 g/dL (ref 6.5–8.1)

## 2017-02-08 LAB — CBC WITH DIFFERENTIAL/PLATELET
BASOS ABS: 0 10*3/uL (ref 0.0–0.1)
BASOS PCT: 0 %
Eosinophils Absolute: 0.2 10*3/uL (ref 0.0–0.7)
Eosinophils Relative: 4 %
HEMATOCRIT: 41.8 % (ref 39.0–52.0)
Hemoglobin: 14.6 g/dL (ref 13.0–17.0)
Lymphocytes Relative: 32 %
Lymphs Abs: 1.3 10*3/uL (ref 0.7–4.0)
MCH: 28 pg (ref 26.0–34.0)
MCHC: 34.9 g/dL (ref 30.0–36.0)
MCV: 80.1 fL (ref 78.0–100.0)
MONO ABS: 0.4 10*3/uL (ref 0.1–1.0)
Monocytes Relative: 9 %
NEUTROS ABS: 2.3 10*3/uL (ref 1.7–7.7)
Neutrophils Relative %: 55 %
PLATELETS: 186 10*3/uL (ref 150–400)
RBC: 5.22 MIL/uL (ref 4.22–5.81)
RDW: 15.2 % (ref 11.5–15.5)
WBC: 4.1 10*3/uL (ref 4.0–10.5)

## 2017-02-08 LAB — BRAIN NATRIURETIC PEPTIDE: B NATRIURETIC PEPTIDE 5: 11.1 pg/mL (ref 0.0–100.0)

## 2017-02-08 MED ORDER — FUROSEMIDE 40 MG PO TABS
40.0000 mg | ORAL_TABLET | Freq: Once | ORAL | Status: AC
  Administered 2017-02-08: 40 mg via ORAL
  Filled 2017-02-08: qty 1

## 2017-02-08 MED ORDER — POTASSIUM CHLORIDE CRYS ER 20 MEQ PO TBCR
40.0000 meq | EXTENDED_RELEASE_TABLET | Freq: Once | ORAL | Status: AC
  Administered 2017-02-08: 40 meq via ORAL
  Filled 2017-02-08: qty 2

## 2017-02-08 NOTE — ED Triage Notes (Signed)
Pt complaint of ongoing bilateral lower leg swelling over 2 months; no redness or fever.

## 2017-02-08 NOTE — ED Notes (Signed)
Patient transported to X-ray 

## 2017-02-08 NOTE — Discharge Instructions (Signed)
You need a family doc.  You have seen Dr. Chanetta Marshall group previously, call them if needed or find a new doctor.

## 2017-02-08 NOTE — ED Notes (Signed)
Patient was alert, oriented and stable upon discharge. RN went over AVS and patient had no further questions.  

## 2017-02-08 NOTE — ED Provider Notes (Signed)
[facility] DEPT Provider Note   CSN: [id number] Arrival date & time: 02/08/17  0820     History   Chief Complaint Chief Complaint  Patient presents with  . Leg Swelling    HPI Benjamin Hamilton is a 66 y.o. male.  66 yo M with a chief complaint of bilateral lower extremity swelling. This been going on for the past month or more. He thinks it's because is been using a bug spray that is irritating his skin. He denies itching or burning to the legs. He has been rubbing alcohol on his legs without improvement. Denies shortness of breath, chest pain. Denies orthopnea PND. No history of heart failure in the past. Patient had a primary care doctor though felt that the doctor was too old and is looking for another doctor.   The history is provided by the patient.  Illness  This is a new problem. The current episode started more than 1 week ago. The problem occurs constantly. The problem has been gradually worsening. Pertinent negatives include no chest pain, no abdominal pain, no headaches and no shortness of breath. Nothing aggravates the symptoms. Nothing relieves the symptoms. He has tried nothing for the symptoms. The treatment provided no relief.    Past Medical History:  Diagnosis Date  . Asthma   . Chest pain    a. cath 7/11: dLAD 30%, EF 55-60% (done after abnl Myoview with 5 bts polymorphic VT)  . Hypercholesteremia   . Hypertension   . SOB (shortness of breath)    a. PFTs ok; b. Chest CT 11/11 with bronchial wall thickening, no mass or parenchymal disease;  c. seen by Dr. Gwenette Greet - ? environment asthma vs. GERD (PPI no help)  . Wears dentures    top    Patient Active Problem List   Diagnosis Date Noted  . Excessive ear wax, bilateral 10/02/2016  . Hearing impairment 10/02/2016  . Healthcare maintenance 05/01/2016  . Allergic rhinitis 11/21/2012  . 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 Essential hypertension 10/19/2009  . Asthma, intrinsic 10/19/2009  .  CHEST PAIN 10/19/2009    Past Surgical History:  Procedure Laterality Date  . CARDIAC CATHETERIZATION  2011  . HERNIA REPAIR  1988   right ing   . OPEN REDUCTION INTERNAL FIXATION (ORIF) DISTAL RADIAL FRACTURE Left 06/15/2014   Procedure: OPEN REDUCTION INTERNAL FIXATION (ORIF) LEFT DISTAL RADIAL FRACTURE;  Surgeon: Jolyn Nap, MD;  Location: [facility];  Service: Orthopedics;  Laterality: Left;  ANESTHESIA:  GENERAL/INTERSCALENE BLOCK       Home Medications    Prior to Admission medications   Medication Sig Start Date End Date Taking? Authorizing Provider  amLODipine (NORVASC) 10 MG tablet Take 1 tablet (10 mg total) by mouth daily. 01/01/17  Yes Jola Schmidt, MD  aspirin EC 81 MG tablet Take 1 tablet (81 mg total) by mouth daily. 02/14/13  Yes Weaver, Scott T, PA-C  albuterol (VENTOLIN HFA) 108 (90 Base) MCG/ACT inhaler Inhale 2 puffs into the lungs every 4 (four) hours as needed for wheezing or shortness of breath. Patient not taking: Reported on 02/08/2017 05/01/16   Minus Liberty, MD  atorvastatin (LIPITOR) 40 MG tablet Take 1 tablet (40 mg total) by mouth daily. Patient not taking: Reported on 02/08/2017 05/01/16   Minus Liberty, MD  mometasone-formoterol [facility]) 100-5 MCG/ACT AERO Inhale 2 puffs into the lungs 2 (two) times daily. Patient not taking: Reported on 02/08/2017 05/01/16   Minus Liberty, MD    Family  History Family History  Problem Relation Age of Onset  . Asthma Mother   . Diabetes Mother   . Hypertension Mother   . Heart attack Father 47  . Hypertension Father     Social History Social History  Substance Use Topics  . Smoking status: Never Smoker  . Smokeless tobacco: Never Used  . Alcohol use 3.6 oz/week    6 Cans of beer per week     Comment: msotly on weekends     Allergies   Patient has no known allergies.   Review of Systems Review of Systems  Constitutional: Negative for chills and fever.  HENT:  Negative for congestion and facial swelling.   Eyes: Negative for discharge and visual disturbance.  Respiratory: Negative for shortness of breath.   Cardiovascular: Positive for leg swelling. Negative for chest pain and palpitations.  Gastrointestinal: Negative for abdominal pain, diarrhea and vomiting.  Musculoskeletal: Negative for arthralgias and myalgias.  Skin: Negative for color change and rash.  Neurological: Negative for tremors, syncope and headaches.  Psychiatric/Behavioral: Negative for confusion and dysphoric mood.     Physical Exam Updated Vital Signs BP 135/81 (BP Location: Right Arm)   Pulse 91   Temp 98.2 F (36.8 C) (Oral)   Resp 20   SpO2 95%   Physical Exam  Constitutional: He is oriented to person, place, and time. He appears well-developed and well-nourished.  HENT:  Head: Normocephalic and atraumatic.  Eyes: Pupils are equal, round, and reactive to light. EOM are normal.  Neck: Normal range of motion. Neck supple. No JVD present.  Cardiovascular: Normal rate and regular rhythm.  Exam reveals no gallop and no friction rub.   No murmur heard. Pulmonary/Chest: No respiratory distress. He has no wheezes. He has rales (bases).  Abdominal: He exhibits no distension and no mass. There is no tenderness. There is no rebound and no guarding.  obese  Musculoskeletal: Normal range of motion. He exhibits edema (3+ up to the thighs).  Neurological: He is alert and oriented to person, place, and time.  Skin: No rash noted. No pallor.  Psychiatric: He has a normal mood and affect. His behavior is normal.  Nursing note and vitals reviewed.    ED Treatments / Results  Labs (all labs ordered are listed, but only abnormal results are displayed) Labs Reviewed  CBC WITH DIFFERENTIAL/PLATELET  COMPREHENSIVE METABOLIC PANEL  BRAIN NATRIURETIC PEPTIDE    EKG  EKG Interpretation  Date/Time:  Thursday February 08 2017 09:34:29 EDT Ventricular Rate:  66 PR Interval:      QRS Duration: 103 QT Interval:  429 QTC Calculation: 450 R Axis:   31 Text Interpretation:  Sinus rhythm Ventricular premature complex flipped t waves laterally resolved Otherwise no significant change Confirmed by Deno Etienne [phone]) on 02/08/2017 9:41:55 AM       Radiology Dg Chest 2 View  Result Date: 02/08/2017 CLINICAL DATA:  Bilateral lower extremity swelling. EXAM: CHEST  2 VIEW COMPARISON:  12/24/2010 FINDINGS: Mild peribronchial thickening. Heart is borderline in size. No confluent opacities, effusions or edema. Degenerative changes in the lumbar spine. No acute bony abnormality. IMPRESSION: Borderline heart size.  Mild bronchitic changes. Electronically Signed   By: Rolm Baptise M.D.   On: 02/08/2017 09:37    Procedures Procedures (including critical care time)  Medications Ordered in ED Medications  furosemide (LASIX) tablet 40 mg (not administered)  potassium chloride SA (K-DUR,KLOR-CON) CR tablet 40 mEq (not administered)     Initial Impression / Assessment and  Plan / ED Course  I have reviewed the triage vital signs and the nursing notes.  Pertinent labs & imaging results that were available during my care of the patient were reviewed by me and considered in my medical decision making (see chart for details).     66 yo M with a chief complaint of bilateral lower extremity edema. Going on for the past month or so. Will screen for heart failure, renal failure.  BNP normal, cxr with borderline size, cmp unremarkable.  D/c home.  PCP follow up.  Given one dose of lasix and K.   10:51 AM:  I have discussed the diagnosis/risks/treatment options with the patient and believe the pt to be eligible for discharge home to follow-up with PCP. We also discussed returning to the ED immediately if new or worsening sx occur. We discussed the sx which are most concerning (e.g., sudden worsening sob, syncope) that necessitate immediate return. Medications administered to the patient  during their visit and any new prescriptions provided to the patient are listed below.  Medications given during this visit Medications  furosemide (LASIX) tablet 40 mg (not administered)  potassium chloride SA (K-DUR,KLOR-CON) CR tablet 40 mEq (not administered)     The patient appears reasonably screen and/or stabilized for discharge and I doubt any other medical condition or other [facility] requiring further screening, evaluation, or treatment in the ED at this time prior to discharge.    Final Clinical Impressions(s) / ED Diagnoses   Final diagnoses:  Peripheral edema    New Prescriptions New Prescriptions   No medications on file     Deno Etienne, DO 02/08/17 1051

## 2017-02-08 NOTE — ED Notes (Signed)
Delay in EKG. Pt going to x-ray

## 2017-08-24 ENCOUNTER — Encounter (HOSPITAL_COMMUNITY): Payer: Self-pay | Admitting: Emergency Medicine

## 2017-08-24 ENCOUNTER — Emergency Department (HOSPITAL_COMMUNITY)
Admission: EM | Admit: 2017-08-24 | Discharge: 2017-08-24 | Disposition: A | Discharge status: Home / Self Care | Payer: Self-pay | Attending: Emergency Medicine | Admitting: Emergency Medicine

## 2017-08-24 DIAGNOSIS — Z5321 Procedure and treatment not carried out due to patient leaving prior to being seen by health care provider: Secondary | ICD-10-CM | POA: Insufficient documentation

## 2017-08-24 DIAGNOSIS — G479 Sleep disorder, unspecified: Secondary | ICD-10-CM | POA: Insufficient documentation

## 2017-08-24 NOTE — ED Triage Notes (Signed)
Patient reports that his stomach "growls" and been keeping him up at night. Reports eating and drinking normally and denies pain.

## 2017-08-24 NOTE — ED Triage Notes (Signed)
Patient adds that he is out of HTN medication Amlodipine Besylate 10mg  daily and needs refill.

## 2017-08-27 ENCOUNTER — Encounter (HOSPITAL_COMMUNITY): Payer: Self-pay | Admitting: *Deleted

## 2017-08-27 ENCOUNTER — Emergency Department (HOSPITAL_COMMUNITY)
Admission: EM | Admit: 2017-08-27 | Discharge: 2017-08-27 | Disposition: A | Discharge status: Home / Self Care | Payer: Self-pay | Attending: Emergency Medicine | Admitting: Emergency Medicine

## 2017-08-27 DIAGNOSIS — Z79899 Other long term (current) drug therapy: Secondary | ICD-10-CM | POA: Insufficient documentation

## 2017-08-27 DIAGNOSIS — J45909 Unspecified asthma, uncomplicated: Secondary | ICD-10-CM | POA: Insufficient documentation

## 2017-08-27 DIAGNOSIS — Z7982 Long term (current) use of aspirin: Secondary | ICD-10-CM | POA: Insufficient documentation

## 2017-08-27 DIAGNOSIS — R198 Other specified symptoms and signs involving the digestive system and abdomen: Secondary | ICD-10-CM | POA: Insufficient documentation

## 2017-08-27 DIAGNOSIS — I10 Essential (primary) hypertension: Secondary | ICD-10-CM | POA: Insufficient documentation

## 2017-08-27 DIAGNOSIS — Z760 Encounter for issue of repeat prescription: Secondary | ICD-10-CM | POA: Insufficient documentation

## 2017-08-27 MED ORDER — AMLODIPINE BESYLATE 10 MG PO TABS: 10.0000 mg | ORAL_TABLET | Freq: Every day | ORAL | 0 refills | Status: DC

## 2017-08-27 MED ORDER — OMEPRAZOLE 20 MG PO CPDR: 20.0000 mg | DELAYED_RELEASE_CAPSULE | Freq: Every day | ORAL | 0 refills | Status: DC

## 2017-08-27 MED ORDER — AMLODIPINE BESYLATE 5 MG PO TABS
5.0000 mg | ORAL_TABLET | Freq: Once | ORAL | Status: AC
  Administered 2017-08-27: 5 mg via ORAL
  Filled 2017-08-27: qty 1

## 2017-08-27 MED FILL — ?OMEPRAZOLE 20 MG CPDR: 20 | 10 days supply | Qty: 10 | Fill #0

## 2017-08-27 MED FILL — AMLODIPINE BESYLATE 10 MG T: 10 | 30 days supply | Qty: 30 | Fill #0

## 2017-08-27 NOTE — Discharge Instructions (Signed)
Follow up at the [facility] listed below for future medication refills and to establish primary care. Return to ED for worsening pain, chest pain, abdominal pain, vomiting, lightheadedness, blood in stool.

## 2017-08-27 NOTE — ED Provider Notes (Signed)
[facility] DEPT Provider Note   CSN: [id number] Arrival date & time: 08/27/17  2778     History   Chief Complaint Chief Complaint  Patient presents with  . Medication Refill    HPI Benjamin Hamilton is a 67 y.o. male with past medical history of hypertension, hyperlipidemia, who presents to ED for evaluation of "growling" in his stomach for the past week.  He states that he is notices persistent growling regardless of what he eats for the time of day, and denies previous history of similar symptoms. Denies any vomiting, nausea, feelings of indigestion, abdominal pain, chest pain, shortness of breath, hemoptysis, bowel changes, urinary symptoms, fever, headache, vision changes.  Prior abdominal surgeries include hernia repair many years ago. He would also like a refill of his amlodipine 10 mg.  He has been out of this medication for about 6 months due to not having a PCP.  HPI  Past Medical History:  Diagnosis Date  . Asthma   . Chest pain    a. cath 7/11: dLAD 30%, EF 55-60% (done after abnl Myoview with 5 bts polymorphic VT)  . Hypercholesteremia   . Hypertension   . SOB (shortness of breath)    a. PFTs ok; b. Chest CT 11/11 with bronchial wall thickening, no mass or parenchymal disease;  c. seen by Dr. Gwenette Greet - ? environment asthma vs. GERD (PPI no help)  . Wears dentures    top    Patient Active Problem List   Diagnosis Date Noted  . Excessive ear wax, bilateral 10/02/2016  . Hearing impairment 10/02/2016  . Healthcare maintenance 05/01/2016  . Allergic rhinitis 11/21/2012  . 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 Essential hypertension 10/19/2009  . Asthma, intrinsic 10/19/2009  . CHEST PAIN 10/19/2009    Past Surgical History:  Procedure Laterality Date  . CARDIAC CATHETERIZATION  2011  . HERNIA REPAIR  1988   right ing   . OPEN REDUCTION INTERNAL FIXATION (ORIF) DISTAL RADIAL FRACTURE Left 06/15/2014   Procedure: OPEN  REDUCTION INTERNAL FIXATION (ORIF) LEFT DISTAL RADIAL FRACTURE;  Surgeon: Jolyn Nap, MD;  Location: [facility];  Service: Orthopedics;  Laterality: Left;  ANESTHESIA:  GENERAL/INTERSCALENE BLOCK       Home Medications    Prior to Admission medications   Medication Sig Start Date End Date Taking? Authorizing Provider  albuterol (VENTOLIN HFA) 108 (90 Base) MCG/ACT inhaler Inhale 2 puffs into the lungs every 4 (four) hours as needed for wheezing or shortness of breath. Patient not taking: Reported on 02/08/2017 05/01/16   Minus Liberty, MD  amLODipine (NORVASC) 10 MG tablet Take 1 tablet (10 mg total) by mouth daily. 08/27/17   Delia Heady, PA-C  aspirin EC 81 MG tablet Take 1 tablet (81 mg total) by mouth daily. 02/14/13   Richardson Dopp T, PA-C  atorvastatin (LIPITOR) 40 MG tablet Take 1 tablet (40 mg total) by mouth daily. Patient not taking: Reported on 02/08/2017 05/01/16   Minus Liberty, MD  mometasone-formoterol [facility]) 100-5 MCG/ACT AERO Inhale 2 puffs into the lungs 2 (two) times daily. Patient not taking: Reported on 02/08/2017 05/01/16   Minus Liberty, MD  omeprazole (PRILOSEC) 20 MG capsule Take 1 capsule (20 mg total) by mouth daily. 08/27/17   Delia Heady, PA-C    Family History Family History  Problem Relation Age of Onset  . Asthma Mother   . Diabetes Mother   . Hypertension Mother   . Heart attack Father 95  .  Hypertension Father     Social History Social History   Tobacco Use  . Smoking status: Never Smoker  . Smokeless tobacco: Never Used  Substance Use Topics  . Alcohol use: Yes    Alcohol/week: 3.6 oz    Types: 6 Cans of beer per week    Comment: msotly on weekends  . Drug use: No     Allergies   Patient has no known allergies.   Review of Systems Review of Systems  Constitutional: Negative for appetite change, chills and fever.  HENT: Negative for ear pain, rhinorrhea, sneezing and sore throat.   Eyes:  Negative for photophobia and visual disturbance.  Respiratory: Negative for cough, chest tightness, shortness of breath and wheezing.   Cardiovascular: Negative for chest pain and palpitations.  Gastrointestinal: Negative for abdominal pain, blood in stool, constipation, diarrhea, nausea and vomiting.       +growling in stomach  Genitourinary: Negative for dysuria, hematuria and urgency.  Musculoskeletal: Negative for myalgias.  Skin: Negative for rash.  Neurological: Negative for dizziness, weakness and light-headedness.     Physical Exam Updated Vital Signs BP (!) 185/116   Pulse (!) 56   Temp 97.7 F (36.5 C) (Oral)   Resp 18   SpO2 100%   Physical Exam  Constitutional: He appears well-developed and well-nourished. No distress.  Nontoxic appearing and in no acute distress.  HENT:  Head: Normocephalic and atraumatic.  Nose: Nose normal.  Eyes: Conjunctivae and EOM are normal. Right eye exhibits no discharge. Left eye exhibits no discharge. No scleral icterus.  Neck: Normal range of motion. Neck supple.  Cardiovascular: Normal rate, regular rhythm, normal heart sounds and intact distal pulses. Exam reveals no gallop and no friction rub.  No murmur heard. Pulmonary/Chest: Effort normal and breath sounds normal. No respiratory distress.  No chest TTP.  Abdominal: Soft. Bowel sounds are normal. He exhibits no distension. There is no tenderness. There is no guarding.  Normal bowel sounds. No abdominal TTP.  Musculoskeletal: Normal range of motion. He exhibits no edema.  Neurological: He is alert. He exhibits normal muscle tone. Coordination normal.  Skin: Skin is warm and dry. No rash noted.  Psychiatric: He has a normal mood and affect.  Nursing note and vitals reviewed.    ED Treatments / Results  Labs (all labs ordered are listed, but only abnormal results are displayed) Labs Reviewed - No data to display  EKG  EKG Interpretation  Date/Time:  Monday August 27 2017  06:59:03 EST Ventricular Rate:  61 PR Interval:    QRS Duration: 118 QT Interval:  434 QTC Calculation: 438 R Axis:   -17 Text Interpretation:  Sinus rhythm Nonspecific intraventricular conduction delay Borderline low voltage, extremity leads Nonspecific T abnormalities, lateral leads Confirmed by Dene Gentry [phone]) on 08/27/2017 7:02:45 AM       Radiology No results found.  Procedures Procedures (including critical care time)  Medications Ordered in ED Medications  amLODipine (NORVASC) tablet 5 mg (not administered)     Initial Impression / Assessment and Plan / ED Course  I have reviewed the triage vital signs and the nursing notes.  Pertinent labs & imaging results that were available during my care of the patient were reviewed by me and considered in my medical decision making (see chart for details).     Patient presents to ED for evaluation of "growling" sensation in stomach for the past week.  Would also like a refill of his amlodipine which he has not  taken for 6 months.  He denies any chest pain, shortness of breath, abdominal pain, vomiting, nausea, bowel changes, headache or vision changes. He is hypertensive to 762U systolic, 633H, diastolic. He is overall well-appearing. Normal bowel sounds noted on my examination with no abdominal TTP. He does not appear uncomfortable. EKG with no ischemic changes. I have low suspicion that the growing in his stomach is cardiac or pulmonary in origin. He has had a normal BM this morning so I doubt obstruction, other emergent intraabdominal process. Could be due to diet or hunger. Will be given refill of amlodipine, advised to take OTC antacids as needed. Patient agrees with this plan and feels ready to go home. Patient appears stable for discharge at this time. Strict return precautions given.  Portions of this note were generated with [employer]. Dictation errors may occur despite best attempts at  proofreading.   Final Clinical Impressions(s) / ED Diagnoses   Final diagnoses:  Encounter for medication refill  Stomach growling    ED Discharge Orders        Ordered    amLODipine (NORVASC) 10 MG tablet  Daily     08/27/17 0720    omeprazole (PRILOSEC) 20 MG capsule  Daily     08/27/17 0724       Delia Heady, PA-C 08/27/17 0724    Valarie Merino, MD 08/27/17 [phone]

## 2017-08-27 NOTE — ED Triage Notes (Signed)
Pt reports that he has a "growling" sensation in his chest for about a week. He also has not been taking his BP meds for about 6 months, wants his amlodipine refilled.

## 2017-12-13 ENCOUNTER — Encounter: Payer: Self-pay | Admitting: Internal Medicine

## 2017-12-13 ENCOUNTER — Ambulatory Visit (INDEPENDENT_AMBULATORY_CARE_PROVIDER_SITE_OTHER): Payer: Self-pay | Admitting: Internal Medicine

## 2017-12-13 VITALS — BP 128/86 | HR 80 | Temp 97.9°F | Ht 72.0 in | Wt 288.0 lb

## 2017-12-13 DIAGNOSIS — G4700 Insomnia, unspecified: Secondary | ICD-10-CM

## 2017-12-13 DIAGNOSIS — Z0000 Encounter for general adult medical examination without abnormal findings: Secondary | ICD-10-CM | POA: Insufficient documentation

## 2017-12-13 DIAGNOSIS — J45909 Unspecified asthma, uncomplicated: Secondary | ICD-10-CM

## 2017-12-13 DIAGNOSIS — I10 Essential (primary) hypertension: Secondary | ICD-10-CM

## 2017-12-13 MED ORDER — AMLODIPINE BESYLATE 10 MG PO TABS: 10.0000 mg | ORAL_TABLET | Freq: Every day | ORAL | 3 refills | Status: DC

## 2017-12-13 MED ORDER — MOMETASONE FURO-FORMOTEROL FUM 100-5 MCG/ACT IN AERO: 2.0000 | INHALATION_SPRAY | Freq: Two times a day (BID) | RESPIRATORY_TRACT | 5 refills | Status: DC

## 2017-12-13 MED ORDER — ALBUTEROL SULFATE HFA 108 (90 BASE) MCG/ACT IN AERS: 2.0000 | INHALATION_SPRAY | RESPIRATORY_TRACT | 3 refills | Status: DC | PRN

## 2017-12-13 MED ORDER — TRAZODONE HCL 50 MG PO TABS: 50.0000 mg | ORAL_TABLET | Freq: Every evening | ORAL | 1 refills | Status: DC | PRN

## 2017-12-13 NOTE — Patient Instructions (Addendum)
Please take all new medication as prescribed - the trazodone for sleep (sent to the pharmacy_  Please continue all other medications as before, and refills have been done if requested (with the inhalers done in hardcopy)  Please have the pharmacy call with any other refills you may need.  Please continue your efforts at being more active, low cholesterol diet, and weight control.  Please keep your appointments with your specialists as you may have planned  Please return in 3 months, or sooner if needed

## 2017-12-13 NOTE — Progress Notes (Signed)
Subjective:    Patient ID: Benjamin Hamilton, male    DOB: 1950/08/04, 67 y.o.   MRN: [id number]  HPI  Here to establish, overall doing ok,  Pt denies chest pain, increasing sob or doe, wheezing, orthopnea, PND, increased LE swelling, palpitations, dizziness or syncope.  Pt denies new neurological symptoms such as new headache, or facial or extremity weakness or numbness.  Pt denies polydipsia, polyuria, or low sugar episode.  Pt states overall good compliance with meds, mostly trying to follow appropriate diet, with wt overall stable.  Having difficulty with getting to sleep nightly.  Retired 4 yrs ago after work injury left wrist fx with fall down steps.   Past Medical History:  Diagnosis Date  . Asthma   . Chest pain    a. cath 7/11: dLAD 30%, EF 55-60% (done after abnl Myoview with 5 bts polymorphic VT)  . Hypercholesteremia   . Hypertension   . SOB (shortness of breath)    a. PFTs ok; b. Chest CT 11/11 with bronchial wall thickening, no mass or parenchymal disease;  c. seen by Dr. Gwenette Greet - ? environment asthma vs. GERD (PPI no help)  . Wears dentures    top   Past Surgical History:  Procedure Laterality Date  . CARDIAC CATHETERIZATION  2011  . HERNIA REPAIR  1988   right ing   . OPEN REDUCTION INTERNAL FIXATION (ORIF) DISTAL RADIAL FRACTURE Left 06/15/2014   Procedure: OPEN REDUCTION INTERNAL FIXATION (ORIF) LEFT DISTAL RADIAL FRACTURE;  Surgeon: Jolyn Nap, MD;  Location: [facility];  Service: Orthopedics;  Laterality: Left;  ANESTHESIA:  GENERAL/INTERSCALENE BLOCK    reports that he has never smoked. He has never used smokeless tobacco. He reports that he drinks about 3.6 oz of alcohol per week. He reports that he does not use drugs. family history includes Asthma in his mother; Diabetes in his mother; Heart attack (age of onset: 71) in his father; Hypertension in his father and mother. No Known Allergies Current Outpatient Medications on File Prior to Visit    Medication Sig Dispense Refill  . aspirin EC 81 MG tablet Take 1 tablet (81 mg total) by mouth daily.     No current facility-administered medications on file prior to visit.     Review of Systems  Constitutional: Negative for other unusual diaphoresis or sweats HENT: Negative for ear discharge or swelling Eyes: Negative for other worsening visual disturbances Respiratory: Negative for stridor or other swelling  Gastrointestinal: Negative for worsening distension or other blood Genitourinary: Negative for retention or other urinary change Musculoskeletal: Negative for other MSK pain or swelling Skin: Negative for color change or other new lesions Neurological: Negative for worsening tremors and other numbness  Psychiatric/Behavioral: Negative for worsening agitation or other fatigue All other system neg per pt    Objective:   Physical Exam BP 128/86   Pulse 80   Temp 97.9 F (36.6 C) (Oral)   Ht 6' (1.829 m)   Wt 288 lb (130.6 kg)   SpO2 98%   BMI 39.06 kg/m  VS noted,  Constitutional: Pt appears in NAD HENT: Head: NCAT.  Right Ear: External ear normal.  Left Ear: External ear normal.  Eyes: . Pupils are equal, round, and reactive to light. Conjunctivae and EOM are normal Nose: without d/c or deformity Neck: Neck supple. Gross normal ROM Cardiovascular: Normal rate and regular rhythm.   Pulmonary/Chest: Effort normal and breath sounds without rales or wheezing.  Abd:  Soft,  NT, ND, + BS, no organomegaly Neurological: Pt is alert. At baseline orientation, motor grossly intact Skin: Skin is warm. No rashes, other new lesions, no LE edema Psychiatric: Pt behavior is normal without agitation  No other exam findings    Assessment & Plan:

## 2017-12-14 ENCOUNTER — Telehealth: Payer: Self-pay | Admitting: Internal Medicine

## 2017-12-14 MED ORDER — AMLODIPINE BESYLATE 10 MG PO TABS: 10.0000 mg | ORAL_TABLET | Freq: Every day | ORAL | 3 refills | Status: DC

## 2017-12-14 NOTE — Telephone Encounter (Signed)
Resent rx to correct pharmacy../lmb 

## 2017-12-14 NOTE — Telephone Encounter (Signed)
Copied from [facility] #[id number]. Topic: Quick Communication - Rx Refill/Question >> Dec 14, 2017 10:18 AM Arletha Grippe wrote: Medication: amlodipine   Has the patient contacted their pharmacy? Yes.   (Agent: If no, request that the patient contact the pharmacy for the refill.) (Agent: If yes, when and what did the pharmacy advise?)  Preferred Pharmacy (with phone number or street name): [facility]   Pt says that [facility] does not have this med and would like it to [facility]\   Agent: Please be advised that RX refills may take up to 3 business days. We ask that you follow-up with your pharmacy.

## 2017-12-15 DIAGNOSIS — G4700 Insomnia, unspecified: Secondary | ICD-10-CM | POA: Insufficient documentation

## 2017-12-15 NOTE — Assessment & Plan Note (Signed)
BP Readings from Last 3 Encounters:  12/13/17 128/86  08/27/17 (!) 157/109  08/24/17 (!) 141/91  stable overall by history and exam, recent data reviewed with pt, and pt to continue medical treatment as before,  to f/u any worsening symptoms or concerns

## 2017-12-15 NOTE — Assessment & Plan Note (Signed)
For trazodone qhs prn,  to f/u any worsening symptoms or concerns 

## 2017-12-15 NOTE — Assessment & Plan Note (Signed)
stable overall by history and exam and pt to continue medical treatment as before,  to f/u any worsening symptoms or concerns  

## 2017-12-17 ENCOUNTER — Telehealth: Payer: Self-pay | Admitting: Internal Medicine

## 2017-12-17 MED FILL — AMLODIPINE BESYLATE 10 MG T: 10 | 90 days supply | Qty: 90 | Fill #0

## 2017-12-17 NOTE — Telephone Encounter (Signed)
Pt brought in the hard copys of his Dulera and Albuterol, he does not want them. FYI

## 2018-03-04 ENCOUNTER — Encounter (HOSPITAL_COMMUNITY): Payer: Self-pay | Admitting: Emergency Medicine

## 2018-03-04 ENCOUNTER — Emergency Department (HOSPITAL_COMMUNITY)
Admission: EM | Admit: 2018-03-04 | Discharge: 2018-03-04 | Disposition: A | Discharge status: Home / Self Care | Payer: Medicare Other | Attending: Emergency Medicine | Admitting: Emergency Medicine

## 2018-03-04 DIAGNOSIS — L988 Other specified disorders of the skin and subcutaneous tissue: Secondary | ICD-10-CM | POA: Diagnosis present

## 2018-03-04 DIAGNOSIS — E7800 Pure hypercholesterolemia, unspecified: Secondary | ICD-10-CM | POA: Diagnosis not present

## 2018-03-04 DIAGNOSIS — J45909 Unspecified asthma, uncomplicated: Secondary | ICD-10-CM | POA: Diagnosis not present

## 2018-03-04 DIAGNOSIS — Z79899 Other long term (current) drug therapy: Secondary | ICD-10-CM | POA: Diagnosis not present

## 2018-03-04 DIAGNOSIS — I10 Essential (primary) hypertension: Secondary | ICD-10-CM | POA: Insufficient documentation

## 2018-03-04 DIAGNOSIS — L814 Other melanin hyperpigmentation: Secondary | ICD-10-CM

## 2018-03-04 NOTE — ED Provider Notes (Signed)
[facility] DEPT Provider Note   CSN: [id number] Arrival date & time: 03/04/18  7121     History   Chief Complaint Chief Complaint  Patient presents with  . dark spot on face    HPI Benjamin Hamilton is a 67 y.o. male.  HPI   Benjamin Hamilton is a 67 y.o. male, with a history of asthma, hypercholesterolemia, and HTN, presenting to the ED with a dark spot to the left side of the face beginning 2 days ago.  States he thinks it may be sun related as he was riding on the left side of the bus in direct sunlight.  Denies pain, swelling, other areas of darkening, or any other complaints.      Past Medical History:  Diagnosis Date  . Asthma   . Chest pain    a. cath 7/11: dLAD 30%, EF 55-60% (done after abnl Myoview with 5 bts polymorphic VT)  . Hypercholesteremia   . Hypertension   . SOB (shortness of breath)    a. PFTs ok; b. Chest CT 11/11 with bronchial wall thickening, no mass or parenchymal disease;  c. seen by Dr. Gwenette Greet - ? environment asthma vs. GERD (PPI no help)  . Wears dentures    top    Patient Active Problem List   Diagnosis Date Noted  . Insomnia 12/15/2017  . Preventative health care 12/13/2017  . Excessive ear wax, bilateral 10/02/2016  . Hearing impairment 10/02/2016  . Allergic rhinitis 11/21/2012  . Paroxysmal ventricular tachycardia ([place]) 05/27/2010  . Essential hypertension 10/19/2009  . Asthma, intrinsic 10/19/2009  . CHEST PAIN 10/19/2009    Past Surgical History:  Procedure Laterality Date  . CARDIAC CATHETERIZATION  2011  . HERNIA REPAIR  1988   right ing   . OPEN REDUCTION INTERNAL FIXATION (ORIF) DISTAL RADIAL FRACTURE Left 06/15/2014   Procedure: OPEN REDUCTION INTERNAL FIXATION (ORIF) LEFT DISTAL RADIAL FRACTURE;  Surgeon: Jolyn Nap, MD;  Location: [facility];  Service: Orthopedics;  Laterality: Left;  ANESTHESIA:  GENERAL/INTERSCALENE BLOCK        Home Medications    Prior to  Admission medications   Medication Sig Start Date End Date Taking? Authorizing Provider  amLODipine (NORVASC) 10 MG tablet Take 1 tablet (10 mg total) by mouth daily. 12/14/17  Yes Biagio Borg, MD  albuterol (VENTOLIN HFA) 108 (90 Base) MCG/ACT inhaler Inhale 2 puffs into the lungs every 4 (four) hours as needed for wheezing or shortness of breath. Patient not taking: Reported on 03/04/2018 12/13/17   Biagio Borg, MD  aspirin EC 81 MG tablet Take 1 tablet (81 mg total) by mouth daily. Patient not taking: Reported on 03/04/2018 02/14/13   Richardson Dopp T, PA-C  mometasone-formoterol (DULERA) 100-5 MCG/ACT AERO Inhale 2 puffs into the lungs 2 (two) times daily. Patient not taking: Reported on 03/04/2018 12/13/17   Biagio Borg, MD  traZODone (DESYREL) 50 MG tablet Take 1 tablet (50 mg total) by mouth at bedtime as needed for sleep. Patient not taking: Reported on 03/04/2018 12/13/17   Biagio Borg, MD    Family History Family History  Problem Relation Age of Onset  . Asthma Mother   . Diabetes Mother   . Hypertension Mother   . Heart attack Father 35  . Hypertension Father     Social History Social History   Tobacco Use  . Smoking status: Never Smoker  . Smokeless tobacco: Never Used  Substance Use Topics  .  Alcohol use: Yes    Alcohol/week: 6.0 standard drinks    Types: 6 Cans of beer per week    Comment: msotly on weekends  . Drug use: No     Allergies   Patient has no known allergies.   Review of Systems Review of Systems  HENT: Negative for facial swelling.   Eyes: Negative for visual disturbance.  Gastrointestinal: Negative for nausea and vomiting.  Skin: Positive for color change.     Physical Exam Updated Vital Signs BP (!) 147/105 (BP Location: Left Arm)   Pulse 80   Temp 97.9 F (36.6 C) (Oral)   Resp 18   SpO2 97%   Physical Exam  Constitutional: He appears well-developed and well-nourished. No distress.  HENT:  Head: Normocephalic and atraumatic.     Nose: Nose normal.  Mouth/Throat: Oropharynx is clear and moist.  Darkened pigmentation noted lateral to the left eye involving the lateral section of the zygomatic region.  No noted swelling, erythema, deformities, tenderness. No noted evidence of hemorrhage inside the mouth or nose. No other areas of darkening noted.  Eyes: Conjunctivae are normal.  Neck: Neck supple.  Cardiovascular: Normal rate and regular rhythm.  Pulmonary/Chest: Effort normal.  Neurological: He is alert.  Skin: Skin is warm and dry. He is not diaphoretic. No pallor.  Psychiatric: He has a normal mood and affect. His behavior is normal.  Nursing note and vitals reviewed.      ED Treatments / Results  Labs (all labs ordered are listed, but only abnormal results are displayed) Labs Reviewed - No data to display  EKG None  Radiology No results found.  Procedures Procedures (including critical care time)  Medications Ordered in ED Medications - No data to display   Initial Impression / Assessment and Plan / ED Course  I have reviewed the triage vital signs and the nursing notes.  Pertinent labs & imaging results that were available during my care of the patient were reviewed by me and considered in my medical decision making (see chart for details).     Patient presents with an area of darkening to the left side of the face without other findings.  PCP follow-up.  Return precautions discussed.  Findings and plan of care discussed with Varney Biles, MD.  Final Clinical Impressions(s) / ED Diagnoses   Final diagnoses:  Darkening of skin    ED Discharge Orders    None       Benjamin Hamilton 03/04/18 0809    Varney Biles, MD 03/07/18 2329

## 2018-03-04 NOTE — ED Notes (Signed)
Patient verbalized understanding of discharge instructions, no questions. Patient ambulated out of ED with steady gait in no distress.  

## 2018-03-04 NOTE — Discharge Instructions (Signed)
Continue to observe the region for changes.  Follow-up with your primary care provider on this matter for any further management. Please be especially aware of changes to the present area of darkening, other areas of skin darkening, abnormal bleeding, or any other odd symptoms.

## 2018-03-04 NOTE — ED Triage Notes (Signed)
Pt c/o darker colored spot on left side of face since Saturday. Denies pain, itching. Reports he was outside and did a lot of walking

## 2018-03-19 ENCOUNTER — Encounter: Payer: Self-pay | Admitting: Internal Medicine

## 2018-03-19 ENCOUNTER — Ambulatory Visit (INDEPENDENT_AMBULATORY_CARE_PROVIDER_SITE_OTHER): Payer: Medicare Other | Admitting: Internal Medicine

## 2018-03-19 VITALS — BP 136/88 | HR 86 | Temp 97.9°F | Ht 72.0 in | Wt 270.0 lb

## 2018-03-19 DIAGNOSIS — Z1211 Encounter for screening for malignant neoplasm of colon: Secondary | ICD-10-CM

## 2018-03-19 DIAGNOSIS — R739 Hyperglycemia, unspecified: Secondary | ICD-10-CM | POA: Diagnosis not present

## 2018-03-19 DIAGNOSIS — E785 Hyperlipidemia, unspecified: Secondary | ICD-10-CM

## 2018-03-19 DIAGNOSIS — I10 Essential (primary) hypertension: Secondary | ICD-10-CM

## 2018-03-19 DIAGNOSIS — Z114 Encounter for screening for human immunodeficiency virus [HIV]: Secondary | ICD-10-CM

## 2018-03-19 DIAGNOSIS — G4700 Insomnia, unspecified: Secondary | ICD-10-CM | POA: Diagnosis not present

## 2018-03-19 DIAGNOSIS — N320 Bladder-neck obstruction: Secondary | ICD-10-CM

## 2018-03-19 DIAGNOSIS — Z1159 Encounter for screening for other viral diseases: Secondary | ICD-10-CM

## 2018-03-19 MED ORDER — AMLODIPINE BESYLATE 5 MG PO TABS: 5.0000 mg | ORAL_TABLET | Freq: Every day | ORAL | 3 refills | Status: DC

## 2018-03-19 MED ORDER — TRAZODONE HCL 50 MG PO TABS: 50.0000 mg | ORAL_TABLET | Freq: Every evening | ORAL | 1 refills | Status: DC | PRN

## 2018-03-19 MED ORDER — HYDROCHLOROTHIAZIDE 25 MG PO TABS: 12.5000 mg | ORAL_TABLET | Freq: Every day | ORAL | 3 refills | Status: DC

## 2018-03-19 NOTE — Assessment & Plan Note (Signed)
stable overall by history and exam, recent data reviewed with pt, and pt to continue medical treatment as before,  to f/u any worsening symptoms or concerns Lab Results  Component Value Date   HGBA1C 6.3 05/01/2016

## 2018-03-19 NOTE — Progress Notes (Signed)
Subjective:    Patient ID: Benjamin Hamilton, male    DOB: [birth year], 67 y.o.   MRN: [id number]  HPI Here to f/u; overall doing ok,  Pt denies chest pain, increasing sob or doe, wheezing, orthopnea, PND, increased LE swelling, palpitations, dizziness or syncope.  Pt denies new neurological symptoms such as new headache, or facial or extremity weakness or numbness.  Pt denies polydipsia, polyuria, or low sugar episode.  Pt states overall good compliance with meds, mostly trying to follow appropriate diet, with wt overall stable,  but little exercise however.  Insomnia, keeps waking at 2 am, Also c/o Leg swelling on amlodipine higher dose.  No other new complaints Past Medical History:  Diagnosis Date  . Asthma   . Chest pain    a. cath 7/11: dLAD 30%, EF 55-60% (done after abnl Myoview with 5 bts polymorphic VT)  . Hypercholesteremia   . Hypertension   . SOB (shortness of breath)    a. PFTs ok; b. Chest CT 11/11 with bronchial wall thickening, no mass or parenchymal disease;  c. seen by Dr. Gwenette Greet - ? environment asthma vs. GERD (PPI no help)  . Wears dentures    top   Past Surgical History:  Procedure Laterality Date  . CARDIAC CATHETERIZATION  2011  . HERNIA REPAIR  1988   right ing   . OPEN REDUCTION INTERNAL FIXATION (ORIF) DISTAL RADIAL FRACTURE Left 06/15/2014   Procedure: OPEN REDUCTION INTERNAL FIXATION (ORIF) LEFT DISTAL RADIAL FRACTURE;  Surgeon: Jolyn Nap, MD;  Location: [facility];  Service: Orthopedics;  Laterality: Left;  ANESTHESIA:  GENERAL/INTERSCALENE BLOCK    reports that he has never smoked. He has never used smokeless tobacco. He reports that he drinks about 6.0 standard drinks of alcohol per week. He reports that he does not use drugs. family history includes Asthma in his mother; Diabetes in his mother; Heart attack (age of onset: 39) in his father; Hypertension in his father and mother. No Known Allergies Current Outpatient Medications on  File Prior to Visit  Medication Sig Dispense Refill  . albuterol (VENTOLIN HFA) 108 (90 Base) MCG/ACT inhaler Inhale 2 puffs into the lungs every 4 (four) hours as needed for wheezing or shortness of breath. 18 g 3  . aspirin EC 81 MG tablet Take 1 tablet (81 mg total) by mouth daily.    . mometasone-formoterol (DULERA) 100-5 MCG/ACT AERO Inhale 2 puffs into the lungs 2 (two) times daily. 1 Inhaler 5   No current facility-administered medications on file prior to visit.    Review of Systems  Constitutional: Negative for other unusual diaphoresis or sweats HENT: Negative for ear discharge or swelling Eyes: Negative for other worsening visual disturbances Respiratory: Negative for stridor or other swelling  Gastrointestinal: Negative for worsening distension or other blood Genitourinary: Negative for retention or other urinary change Musculoskeletal: Negative for other MSK pain or swelling Skin: Negative for color change or other new lesions Neurological: Negative for worsening tremors and other numbness  Psychiatric/Behavioral: Negative for worsening agitation or other fatigue All other system neg per pt    Objective:   Physical Exam BP 136/88   Pulse 86   Temp 97.9 F (36.6 C) (Oral)   Ht 6' (1.829 m)   Wt 270 lb (122.5 kg)   SpO2 94%   BMI 36.62 kg/m  VS noted,  Constitutional: Pt appears in NAD HENT: Head: NCAT.  Right Ear: External ear normal.  Left Ear: External ear normal.  Eyes: . Pupils are equal, round, and reactive to light. Conjunctivae and EOM are normal Nose: without d/c or deformity Neck: Neck supple. Gross normal ROM Cardiovascular: Normal rate and regular rhythm.   Pulmonary/Chest: Effort normal and breath sounds without rales or wheezing.  Abd:  Soft, NT, ND, + BS, no organomegaly Neurological: Pt is alert. At baseline orientation, motor grossly intact Skin: Skin is warm. No rashes, other new lesions, no LE edema Psychiatric: Pt behavior is normal without  agitation  No other exam findings Lab Results  Component Value Date   WBC 4.1 02/08/2017   HGB 14.6 02/08/2017   HCT 41.8 02/08/2017   PLT 186 02/08/2017   GLUCOSE 96 02/08/2017   CHOL 206 (H) 02/21/2013   TRIG 96.0 02/21/2013   HDL 56.10 02/21/2013   LDLDIRECT 134.1 02/21/2013   ALT 25 02/08/2017   AST 25 02/08/2017   NA 139 02/08/2017   K 3.6 02/08/2017   CL 104 02/08/2017   CREATININE 1.03 02/08/2017   BUN 11 02/08/2017   CO2 25 02/08/2017   HGBA1C 6.3 05/01/2016       Assessment & Plan:

## 2018-03-19 NOTE — Patient Instructions (Addendum)
Please take all new medication as prescribed - the trazodone  You will be contacted regarding the referral for: colonoscopy  OK to stop the higher dose amlodipine 10 mg  Please take all new medication as prescribed  - the lower dose amlodipine 5 mg, as well as the very mild fluid pill  Please continue all other medications as before, and refills have been done if requested.  Please have the pharmacy call with any other refills you may need.  Please continue your efforts at being more active, low cholesterol diet, and weight control.  You are otherwise up to date with prevention measures today.  Please keep your appointments with your specialists as you may have planned  Please go to the LAB in the Basement (turn left off the elevator) for the tests to be done today  You will be contacted by phone if any changes need to be made immediately.  Otherwise, you will receive a letter about your results with an explanation, but please check with MyChart first.  Please remember to sign up for MyChart if you have not done so, as this will be important to you in the future with finding out test results, communicating by private email, and scheduling acute appointments online when needed.  .Please return in 6 months, or sooner if needed

## 2018-03-19 NOTE — Assessment & Plan Note (Signed)
With edema, ok to decrease the amlodipine 5 qd, and add hct 12.5 qd

## 2018-03-19 NOTE — Assessment & Plan Note (Signed)
Mild to mod, for trazodone qhs prn,  to f/u any worsening symptoms or concerns 

## 2018-03-19 NOTE — Assessment & Plan Note (Signed)
stable overall by history and exam, recent data reviewed with pt, and pt to continue medical treatment as before,  to f/u any worsening symptoms or concerns, for f/u labs 

## 2018-03-21 ENCOUNTER — Encounter: Payer: Self-pay | Admitting: Gastroenterology

## 2018-04-04 ENCOUNTER — Telehealth: Payer: Self-pay

## 2018-04-04 NOTE — Telephone Encounter (Signed)
Error

## 2018-04-11 ENCOUNTER — Ambulatory Visit (AMBULATORY_SURGERY_CENTER): Payer: Self-pay | Admitting: *Deleted

## 2018-04-11 VITALS — Ht 73.0 in | Wt 274.0 lb

## 2018-04-11 DIAGNOSIS — Z1211 Encounter for screening for malignant neoplasm of colon: Secondary | ICD-10-CM

## 2018-04-11 MED ORDER — BISACODYL 5 MG PO TBEC: 5.0000 mg | DELAYED_RELEASE_TABLET | Freq: Once | ORAL | 0 refills | Status: AC

## 2018-04-11 MED ORDER — PEG 3350-KCL-NA BICARB-NACL 420 G PO SOLR: 4000.0000 mL | Freq: Once | ORAL | 0 refills | Status: AC

## 2018-04-11 NOTE — Progress Notes (Signed)
Patient denies any allergies to eggs or soy. Patient denies any problems with anesthesia/sedation. Patient denies any oxygen use at home. Patient denies taking any diet/weight loss medications or blood thinners. EMMI education offered, pt declined.  

## 2018-05-02 ENCOUNTER — Ambulatory Visit: Payer: Medicare Other | Admitting: Gastroenterology

## 2018-05-02 DIAGNOSIS — Z1211 Encounter for screening for malignant neoplasm of colon: Secondary | ICD-10-CM

## 2018-05-02 MED ORDER — PEG 3350-KCL-NA BICARB-NACL 420 G PO SOLR: 4000.0000 mL | Freq: Once | ORAL | 0 refills | Status: AC

## 2018-05-03 NOTE — Progress Notes (Signed)
Patient had not completed his preparation and was still having formed stools after discussion with RN. Plan for full colonoscopy preparation and rescheduling. Chart signed to close it.  Justice Britain, MD [facility] Office # [phone]

## 2018-05-08 ENCOUNTER — Ambulatory Visit (AMBULATORY_SURGERY_CENTER): Payer: Medicare Other | Admitting: Gastroenterology

## 2018-05-08 ENCOUNTER — Encounter: Payer: Self-pay | Admitting: Gastroenterology

## 2018-05-08 VITALS — BP 154/88 | HR 43 | Temp 98.4°F | Resp 17 | Ht 72.0 in | Wt 270.0 lb

## 2018-05-08 DIAGNOSIS — D122 Benign neoplasm of ascending colon: Secondary | ICD-10-CM

## 2018-05-08 DIAGNOSIS — Z1211 Encounter for screening for malignant neoplasm of colon: Secondary | ICD-10-CM

## 2018-05-08 DIAGNOSIS — D125 Benign neoplasm of sigmoid colon: Secondary | ICD-10-CM | POA: Diagnosis not present

## 2018-05-08 MED ORDER — SODIUM CHLORIDE 0.9 % IV SOLN: 500.0000 mL | Freq: Once | INTRAVENOUS | Status: DC

## 2018-05-08 NOTE — Patient Instructions (Signed)
YOU HAD AN ENDOSCOPIC PROCEDURE TODAY AT [facility]:   Refer to the procedure report that was given to you for any specific questions about what was found during the examination.  If the procedure report does not answer your questions, please call your gastroenterologist to clarify.  If you requested that your care partner not be given the details of your procedure findings, then the procedure report has been included in a sealed envelope for you to review at your convenience later.  YOU SHOULD EXPECT: Some feelings of bloating in the abdomen. Passage of more gas than usual.  Walking can help get rid of the air that was put into your GI tract during the procedure and reduce the bloating. If you had a lower endoscopy (such as a colonoscopy or flexible sigmoidoscopy) you may notice spotting of blood in your stool or on the toilet paper. If you underwent a bowel prep for your procedure, you may not have a normal bowel movement for a few days.  Please Note:  You might notice some irritation and congestion in your nose or some drainage.  This is from the oxygen used during your procedure.  There is no need for concern and it should clear up in a day or so.  SYMPTOMS TO REPORT IMMEDIATELY:   Following lower endoscopy (colonoscopy or flexible sigmoidoscopy):  Excessive amounts of blood in the stool  Significant tenderness or worsening of abdominal pains  Swelling of the abdomen that is new, acute  Fever of 100F or higher   Following upper endoscopy (EGD)  Vomiting of blood or coffee ground material  New chest pain or pain under the shoulder blades  Painful or persistently difficult swallowing  New shortness of breath  Fever of 100F or higher  Black, tarry-looking stools  For urgent or emergent issues, a gastroenterologist can be reached at any hour by calling [phone].   DIET:  We do recommend a small meal at first, but then you may proceed to your regular diet.  Drink  plenty of fluids but you should avoid alcoholic beverages for 24 hours.  ACTIVITY:  You should plan to take it easy for the rest of today and you should NOT DRIVE or use heavy machinery until tomorrow (because of the sedation medicines used during the test).    FOLLOW UP: Our staff will call the number listed on your records the next business day following your procedure to check on you and address any questions or concerns that you may have regarding the information given to you following your procedure. If we do not reach you, we will leave a message.  However, if you are feeling well and you are not experiencing any problems, there is no need to return our call.  We will assume that you have returned to your regular daily activities without incident.  If any biopsies were taken you will be contacted by phone or by letter within the next 1-3 weeks.  Please call us at [phone] if you have not heard about the biopsies in 3 weeks.    SIGNATURES/CONFIDENTIALITY: You and/or your care partner have signed paperwork which will be entered into your electronic medical record.  These signatures attest to the fact that that the information above on your After Visit Summary has been reviewed and is understood.  Full responsibility of the confidentiality of this discharge information lies with you and/or your care-partner.  Polyp information given.  Recall colonoscopy 3 months.

## 2018-05-08 NOTE — Progress Notes (Signed)
Called to room to assist during endoscopic procedure.  Patient ID and intended procedure confirmed with present staff. Received instructions for my participation in the procedure from the performing physician.  

## 2018-05-08 NOTE — Op Note (Signed)
[place] Patient Name: Benjamin Hamilton Procedure Date: 05/08/2018 9:52 AM MRN: [id number] Endoscopist: Thornton Park MD, MD Age: 67 Referring MD:  Date of Birth: Dec 03, 1950 Gender: Male Account #: [id number] Procedure:                Colonoscopy Indications:              Screening for colorectal malignant neoplasm. There                            is no family history of colon cancer or polyps. No                            baseline GI symptoms. Medicines:                See the Anesthesia note for documentation of the                            administered medications Procedure:                Pre-Anesthesia Assessment:                           - Prior to the procedure, a History and Physical                            was performed, and patient medications and                            allergies were reviewed. The patient's tolerance of                            previous anesthesia was also reviewed. The risks                            and benefits of the procedure and the sedation                            options and risks were discussed with the patient.                            All questions were answered, and informed consent                            was obtained. Prior Anticoagulants: The patient has                            taken no previous anticoagulant or antiplatelet                            agents. ASA Grade Assessment: II - A patient with                            mild systemic disease. After reviewing the risks  and benefits, the patient was deemed in                            satisfactory condition to undergo the procedure.                           After obtaining informed consent, the colonoscope                            was passed under direct vision. Throughout the                            procedure, the patient's blood pressure, pulse, and                            oxygen saturations were monitored  continuously. The                            Colonoscope was introduced through the anus and                            advanced to the the terminal ileum, with                            identification of the appendiceal orifice and IC                            valve. The colonoscopy was performed without                            difficulty. The patient tolerated the procedure                            well. The quality of the bowel preparation was good. Scope In: 9:59:28 AM Scope Out: 10:23:47 AM Scope Withdrawal Time: 0 hours 19 minutes 17 seconds  Total Procedure Duration: 0 hours 24 minutes 19 seconds  Findings:                 The perianal and digital rectal examinations were                            normal.                           A few small-mouthed diverticula were found in the                            sigmoid colon and descending colon.                           Four sessile polyps were found in the ascending                            colon. The polyps were 2 to 4 mm in size. These  polyps were removed with a cold snare. Resection                            and retrieval were complete.                           A 28 mm polyp was found in the ascending colon one                            fold from the IC valve. The polyp was                            semi-pedunculated and multilobulated. It involved                            both sides of the colonic fold. The polyp was                            removed with a piecemeal technique using a hot                            snare. Resection and retrieval were complete.                            However, it was difficult to fully evaluate the                            back side of the polyp due to the cautery effect on                            the fold. Area was tattooed with an injection of 2                            mL of [place] ink and 2 mL of Spot (carbon black).                            A 3 mm polyp was found in the sigmoid colon. The                            polyp was sessile. The polyp was removed with a                            piecemeal technique using a cold biopsy forceps.                            Resection and retrieval were complete.                           The exam was otherwise without abnormality on                            direct and retroflexion views. Complications:  No immediate complications. Estimated Blood Loss:     Estimated blood loss: none. Impression:               - Diverticulosis in the sigmoid colon and in the                            descending colon.                           - Four 2 to 4 mm polyps in the ascending colon,                            removed with a cold snare. Resected and retrieved.                           - One 25 mm polyp in the ascending colon, removed                            piecemeal using a hot snare. Resected and                            retrieved. Tattooed.                           - One 3 mm polyp in the sigmoid colon, removed                            piecemeal using a cold biopsy forceps. Resected and                            retrieved.                           - The examination was otherwise normal on direct                            and retroflexion views. Recommendation:           - Discharge patient to home.                           - Resume previous diet today.                           - Continue present medications.                           - Await pathology results.                           - Repeat colonoscopy in 3 months to review the                            polypectomy site. Thornton Park MD, MD 05/08/2018 10:31:35 AM This report has been signed electronically.

## 2018-05-08 NOTE — Progress Notes (Signed)
Pt. Advised to make sure he has colonoscopy recall for 3 months-January 2020

## 2018-05-08 NOTE — Progress Notes (Signed)
A and O x3. Report to RN. Tolerated MAC anesthesia well.

## 2018-05-09 ENCOUNTER — Telehealth: Payer: Self-pay

## 2018-05-09 NOTE — Telephone Encounter (Signed)
  Follow up Call-  Call back number 05/08/2018  Post procedure Call Back phone  # [phone]  Permission to leave phone message Yes  Some recent data might be hidden     Patient questions:  Do you have a fever, pain , or abdominal swelling? No. Pain Score  0 *  Have you tolerated food without any problems? Yes.    Have you been able to return to your normal activities? Yes.    Do you have any questions about your discharge instructions: Diet   No. Medications  No. Follow up visit  No.  Do you have questions or concerns about your Care? No.  Actions: * If pain score is 4 or above: No action needed, pain <4.

## 2018-05-13 ENCOUNTER — Encounter: Payer: Self-pay | Admitting: Gastroenterology

## 2018-05-22 ENCOUNTER — Encounter: Payer: Medicare Other | Admitting: Gastroenterology

## 2018-08-01 ENCOUNTER — Other Ambulatory Visit: Payer: Self-pay | Admitting: Internal Medicine

## 2018-08-01 NOTE — Telephone Encounter (Signed)
Copied from [place] [phone]. Topic: Quick Communication - Rx Refill/Question >> Aug 01, 2018  2:15 PM Judyann Munson wrote: Medication: amLODipine (NORVASC) 5 MG tablet     Has the patient contacted their pharmacy?no   Preferred Pharmacy (with phone number or street name): [id number] [address], [place] [phone] (Phone) [phone] (Fax)    Agent: Please be advised that RX refills may take up to 3 business days. We ask that you follow-up with your pharmacy.

## 2018-08-01 NOTE — Telephone Encounter (Signed)
Called to [facility]. Per [place] pt has refills remaining. [facility] will inform patient when refill is ready.

## 2018-08-22 ENCOUNTER — Encounter: Payer: Self-pay | Admitting: Gastroenterology

## 2018-09-18 ENCOUNTER — Telehealth: Payer: Self-pay | Admitting: Internal Medicine

## 2018-09-18 ENCOUNTER — Encounter: Payer: Self-pay | Admitting: Internal Medicine

## 2018-09-18 ENCOUNTER — Ambulatory Visit (INDEPENDENT_AMBULATORY_CARE_PROVIDER_SITE_OTHER): Payer: Medicare Other | Admitting: Internal Medicine

## 2018-09-18 VITALS — BP 132/82 | HR 77 | Temp 97.6°F | Ht 72.0 in | Wt 282.0 lb

## 2018-09-18 DIAGNOSIS — N320 Bladder-neck obstruction: Secondary | ICD-10-CM

## 2018-09-18 DIAGNOSIS — E785 Hyperlipidemia, unspecified: Secondary | ICD-10-CM

## 2018-09-18 DIAGNOSIS — Z23 Encounter for immunization: Secondary | ICD-10-CM

## 2018-09-18 DIAGNOSIS — I10 Essential (primary) hypertension: Secondary | ICD-10-CM | POA: Diagnosis not present

## 2018-09-18 DIAGNOSIS — J45909 Unspecified asthma, uncomplicated: Secondary | ICD-10-CM

## 2018-09-18 DIAGNOSIS — R739 Hyperglycemia, unspecified: Secondary | ICD-10-CM | POA: Diagnosis not present

## 2018-09-18 DIAGNOSIS — J309 Allergic rhinitis, unspecified: Secondary | ICD-10-CM | POA: Diagnosis not present

## 2018-09-18 MED ORDER — HYDROCHLOROTHIAZIDE 25 MG PO TABS: 12.5000 mg | ORAL_TABLET | Freq: Every day | ORAL | 3 refills | Status: DC

## 2018-09-18 MED ORDER — AMLODIPINE BESYLATE 5 MG PO TABS: 5.0000 mg | ORAL_TABLET | Freq: Every day | ORAL | 3 refills | Status: DC

## 2018-09-18 MED ORDER — ALBUTEROL SULFATE HFA 108 (90 BASE) MCG/ACT IN AERS: 2.0000 | INHALATION_SPRAY | RESPIRATORY_TRACT | 3 refills | Status: DC | PRN

## 2018-09-18 MED ORDER — MOMETASONE FURO-FORMOTEROL FUM 100-5 MCG/ACT IN AERO: 2.0000 | INHALATION_SPRAY | Freq: Two times a day (BID) | RESPIRATORY_TRACT | 5 refills | Status: DC

## 2018-09-18 MED ORDER — TRIAMCINOLONE ACETONIDE 55 MCG/ACT NA AERO: 2.0000 | INHALATION_SPRAY | Freq: Every day | NASAL | 12 refills | Status: DC

## 2018-09-18 MED ORDER — MOMETASONE FURO-FORMOTEROL FUM 100-5 MCG/ACT IN AERO: 2.0000 | INHALATION_SPRAY | Freq: Two times a day (BID) | RESPIRATORY_TRACT | 11 refills | Status: DC

## 2018-09-18 MED ORDER — BUDESONIDE-FORMOTEROL FUMARATE 160-4.5 MCG/ACT IN AERO: 2.0000 | INHALATION_SPRAY | Freq: Two times a day (BID) | RESPIRATORY_TRACT | 3 refills | Status: DC

## 2018-09-18 MED ORDER — TRAZODONE HCL 50 MG PO TABS: 50.0000 mg | ORAL_TABLET | Freq: Every evening | ORAL | 1 refills | Status: DC | PRN

## 2018-09-18 NOTE — Telephone Encounter (Signed)
Ok to let pt know,   We had to change the dulera inhaler to Symbicort due to insurance coverage

## 2018-09-18 NOTE — Assessment & Plan Note (Signed)
stable overall by history and exam, recent data reviewed with pt, and pt to continue medical treatment as before,  to f/u any worsening symptoms or concerns  

## 2018-09-18 NOTE — Assessment & Plan Note (Signed)
[place] for [employer] asd,  to f/u any worsening symptoms or concerns

## 2018-09-18 NOTE — Patient Instructions (Addendum)
You had the Prevnar 13 pneumonia shot today  Please take all new medication as prescribed- the Nasacort for allergies, and trazodone for sleep  Please continue all other medications as before, and refills have been done if requested.  Please have the pharmacy call with any other refills you may need.  Please continue your efforts at being more active, low cholesterol diet, and weight control.  You are otherwise up to date with prevention measures today.  Please keep your appointments with your specialists as you may have planned  Please go to the LAB in the Basement (turn left off the elevator) for the tests to be done today  You will be contacted by phone if any changes need to be made immediately.  Otherwise, you will receive a letter about your results with an explanation, but please check with MyChart first.  Please remember to sign up for MyChart if you have not done so, as this will be important to you in the future with finding out test results, communicating by private email, and scheduling acute appointments online when needed.  Please return in 1 year for your yearly visit, or sooner if needed

## 2018-09-18 NOTE — Assessment & Plan Note (Signed)
stable overall by history and exam, recent data reviewed with pt, and pt to continue medical treatment as before,  to f/u any worsening symptoms or concerns, for a1c with labs 

## 2018-09-18 NOTE — Assessment & Plan Note (Signed)
stable overall by history and exam, recent data reviewed with pt, and pt to continue medical treatment as before,  to f/u any worsening symptoms or concerns, for lipids with labs 

## 2018-09-18 NOTE — Progress Notes (Signed)
Subjective:    Patient ID: Benjamin Hamilton, male    DOB: 1951/02/17, 68 y.o.   MRN: [id number]  HPI Here for yearly f/u;  Overall doing ok;  Pt denies Chest pain, worsening SOB, DOE, wheezing, orthopnea, PND, worsening LE edema, palpitations, dizziness or syncope.  Pt denies neurological change such as new headache, facial or extremity weakness.  Pt denies polydipsia, polyuria, or low sugar symptoms. Pt states overall good compliance with treatment and medications, good tolerability, and has been trying to follow appropriate diet.  Pt denies worsening depressive symptoms, suicidal ideation or panic. No fever, night sweats, wt loss, loss of appetite, or other constitutional symptoms.  Pt states good ability with ADL's, has low fall risk, home safety reviewed and adequate, no other significant changes in hearing or vision, and only occasionally active with exercise.  Has painless breast enlargement but has overall worsening obesity due to lack of exercise and diet.   Wt Readings from Last 3 Encounters:  09/18/18 282 lb (127.9 kg)  05/08/18 270 lb (122.5 kg)  04/11/18 274 lb (124.3 kg)  Also with persistent insomnia, tends to wake up 2 AM most nights, hard to get back to sleep.  Denies worsening depressive symptoms, suicidal ideation, or panic; has been since 68 yo. Had a medication before that helped.  Does have several wks ongoing nasal allergy symptoms with clearish congestion, itch and sneezing, without fever, pain, ST, cough, swelling or wheezing. Past Medical History:  Diagnosis Date  . Asthma   . Chest pain    a. cath 7/11: dLAD 30%, EF 55-60% (done after abnl Myoview with 5 bts polymorphic VT)  . Hypercholesteremia   . Hypertension   . SOB (shortness of breath)    a. PFTs ok; b. Chest CT 11/11 with bronchial wall thickening, no mass or parenchymal disease;  c. seen by Dr. Gwenette Greet - ? environment asthma vs. GERD (PPI no help)  . Wears dentures    top   Past Surgical History:  Procedure  Laterality Date  . CARDIAC CATHETERIZATION  2011  . HERNIA REPAIR  1988   right ing   . OPEN REDUCTION INTERNAL FIXATION (ORIF) DISTAL RADIAL FRACTURE Left 06/15/2014   Procedure: OPEN REDUCTION INTERNAL FIXATION (ORIF) LEFT DISTAL RADIAL FRACTURE;  Surgeon: Jolyn Nap, MD;  Location: [facility];  Service: Orthopedics;  Laterality: Left;  ANESTHESIA:  GENERAL/INTERSCALENE BLOCK    reports that he has never smoked. He has never used smokeless tobacco. He reports current alcohol use of about 6.0 standard drinks of alcohol per week. He reports that he does not use drugs. family history includes Asthma in his mother; Diabetes in his mother; Heart attack (age of onset: 9) in his father; Hypertension in his father and mother. No Known Allergies Current Outpatient Medications on File Prior to Visit  Medication Sig Dispense Refill  . albuterol (VENTOLIN HFA) 108 (90 Base) MCG/ACT inhaler Inhale 2 puffs into the lungs every 4 (four) hours as needed for wheezing or shortness of breath. 18 g 3  . amLODipine (NORVASC) 5 MG tablet Take 1 tablet (5 mg total) by mouth daily. 90 tablet 3  . aspirin EC 81 MG tablet Take 1 tablet (81 mg total) by mouth daily.    . hydrochlorothiazide (HYDRODIURIL) 25 MG tablet Take 0.5 tablets (12.5 mg total) by mouth daily. 45 tablet 3  . mometasone-formoterol (DULERA) 100-5 MCG/ACT AERO Inhale 2 puffs into the lungs 2 (two) times daily. 1 Inhaler 5  .  traZODone (DESYREL) 50 MG tablet Take 1 tablet (50 mg total) by mouth at bedtime as needed for sleep. 90 tablet 1   No current facility-administered medications on file prior to visit.    Review of Systems /Constitutional: Negative for other unusual diaphoresis, sweats, appetite or weight changes HENT: Negative for other worsening hearing loss, ear pain, facial swelling, mouth sores or neck stiffness.   Eyes: Negative for other worsening pain, redness or other visual disturbance.  Respiratory: Negative  for other stridor or swelling Cardiovascular: Negative for other palpitations or other chest pain  Gastrointestinal: Negative for worsening diarrhea or loose stools, blood in stool, distention or other pain Genitourinary: Negative for hematuria, flank pain or other change in urine volume.  Musculoskeletal: Negative for myalgias or other joint swelling.  Skin: Negative for other color change, or other wound or worsening drainage.  Neurological: Negative for other syncope or numbness. Hematological: Negative for other adenopathy or swelling Psychiatric/Behavioral: Negative for hallucinations, other worsening agitation, SI, self-injury, or new decreased concentration All other system neg per pt    Objective:   Physical Exam BP 132/82   Pulse 77   Temp 97.6 F (36.4 C) (Oral)   Ht 6' (1.829 m)   Wt 282 lb (127.9 kg)   SpO2 95%   BMI 38.25 kg/m  VS noted, obese Constitutional: Pt is oriented to person, place, and time. Appears well-developed and well-nourished, in no significant distress and comfortable Head: Normocephalic and atraumatic  Eyes: Conjunctivae and EOM are normal. Pupils are equal, round, and reactive to light Bilat tm's with mild erythema.  Max sinus areas non tender.  Pharynx with mild erythema, no exudate Right Ear: External ear normal without discharge Left Ear: External ear normal without discharge Nose: Nose without discharge or deformity Mouth/Throat: Oropharynx is without other ulcerations and moist  Neck: Normal range of motion. Neck supple. No JVD present. No tracheal deviation present or significant neck LA or mass Cardiovascular: Normal rate, regular rhythm, normal heart sounds and intact distal pulses.   Pulmonary/Chest: WOB normal and breath sounds without rales or wheezing  Abdominal: Soft. Bowel sounds are normal. NT. No HSM  Musculoskeletal: Normal range of motion. Exhibits no edema Lymphadenopathy: Has no other cervical adenopathy.  Neurological: Pt is  alert and oriented to person, place, and time. Pt has normal reflexes. No cranial nerve deficit. Motor grossly intact, Gait intact Skin: Skin is warm and dry. No rash noted or new ulcerations Psychiatric:  Has mild nervous mood and affect. Behavior is normal without agitation No other exam findings Lab Results  Component Value Date   WBC 4.1 02/08/2017   HGB 14.6 02/08/2017   HCT 41.8 02/08/2017   PLT 186 02/08/2017   GLUCOSE 96 02/08/2017   CHOL 206 (H) 02/21/2013   TRIG 96.0 02/21/2013   HDL 56.10 02/21/2013   LDLDIRECT 134.1 02/21/2013   ALT 25 02/08/2017   AST 25 02/08/2017   NA 139 02/08/2017   K 3.6 02/08/2017   CL 104 02/08/2017   CREATININE 1.03 02/08/2017   BUN 11 02/08/2017   CO2 25 02/08/2017   HGBA1C 6.3 05/01/2016      Assessment & Plan:

## 2018-09-18 NOTE — Telephone Encounter (Signed)
Pt has been informed.

## 2019-04-01 ENCOUNTER — Encounter: Payer: Self-pay | Admitting: Internal Medicine

## 2019-04-01 ENCOUNTER — Other Ambulatory Visit: Payer: Self-pay

## 2019-04-01 ENCOUNTER — Ambulatory Visit (INDEPENDENT_AMBULATORY_CARE_PROVIDER_SITE_OTHER): Payer: Medicare Other | Admitting: Internal Medicine

## 2019-04-01 VITALS — BP 134/90 | HR 71 | Temp 98.7°F | Ht 72.0 in | Wt 279.0 lb

## 2019-04-01 DIAGNOSIS — R739 Hyperglycemia, unspecified: Secondary | ICD-10-CM | POA: Diagnosis not present

## 2019-04-01 DIAGNOSIS — Z23 Encounter for immunization: Secondary | ICD-10-CM | POA: Diagnosis not present

## 2019-04-01 DIAGNOSIS — E611 Iron deficiency: Secondary | ICD-10-CM

## 2019-04-01 DIAGNOSIS — E559 Vitamin D deficiency, unspecified: Secondary | ICD-10-CM

## 2019-04-01 DIAGNOSIS — J45909 Unspecified asthma, uncomplicated: Secondary | ICD-10-CM

## 2019-04-01 DIAGNOSIS — N320 Bladder-neck obstruction: Secondary | ICD-10-CM

## 2019-04-01 DIAGNOSIS — E785 Hyperlipidemia, unspecified: Secondary | ICD-10-CM | POA: Diagnosis not present

## 2019-04-01 DIAGNOSIS — E538 Deficiency of other specified B group vitamins: Secondary | ICD-10-CM

## 2019-04-01 DIAGNOSIS — K439 Ventral hernia without obstruction or gangrene: Secondary | ICD-10-CM

## 2019-04-01 DIAGNOSIS — Z1159 Encounter for screening for other viral diseases: Secondary | ICD-10-CM | POA: Diagnosis not present

## 2019-04-01 NOTE — Patient Instructions (Signed)
You had the flu shot today, and the Tdap tetanus shots today  Please continue all other medications as before, and refills have been done if requested.  Please have the pharmacy call with any other refills you may need.  Please continue your efforts at being more active, low cholesterol diet, and weight control.  You are otherwise up to date with prevention measures today.  Please keep your appointments with your specialists as you may have planned  Please call if the hernia to the bellybutton becomes painful or larger for a referral to general surgury  Please go to the LAB in the Basement (turn left off the elevator) for the tests to be done today  You will be contacted by phone if any changes need to be made immediately.  Otherwise, you will receive a letter about your results with an explanation, but please check with MyChart first.  Please remember to sign up for MyChart if you have not done so, as this will be important to you in the future with finding out test results, communicating by private email, and scheduling acute appointments online when needed.  Please return in 6 months, or sooner if needed

## 2019-04-01 NOTE — Progress Notes (Signed)
Subjective:    Patient ID: Benjamin Hamilton, male    DOB: 1951-05-02, 68 y.o.   MRN: [id number]  HPI  Here to f/u; overall doing ok,  Pt denies chest pain, increasing sob or doe, wheezing, orthopnea, PND, increased LE swelling, palpitations, dizziness or syncope.  Pt denies new neurological symptoms such as new headache, or facial or extremity weakness or numbness.  Pt denies polydipsia, polyuria, or low sugar episode.  Pt states overall good compliance with meds, mostly trying to follow appropriate diet, with wt overall stable. Denies worsening reflux, abd pain, dysphagia, n/v, bowel change or blood, except has noticed some increased ventral hernia size without pain, worse to sit up Past Medical History:  Diagnosis Date  . Asthma   . Chest pain    a. cath 7/11: dLAD 30%, EF 55-60% (done after abnl Myoview with 5 bts polymorphic VT)  . Hypercholesteremia   . Hypertension   . SOB (shortness of breath)    a. PFTs ok; b. Chest CT 11/11 with bronchial wall thickening, no mass or parenchymal disease;  c. seen by Dr. Gwenette Greet - ? environment asthma vs. GERD (PPI no help)  . Wears dentures    top   Past Surgical History:  Procedure Laterality Date  . CARDIAC CATHETERIZATION  2011  . HERNIA REPAIR  1988   right ing   . OPEN REDUCTION INTERNAL FIXATION (ORIF) DISTAL RADIAL FRACTURE Left 06/15/2014   Procedure: OPEN REDUCTION INTERNAL FIXATION (ORIF) LEFT DISTAL RADIAL FRACTURE;  Surgeon: Jolyn Nap, MD;  Location: [facility];  Service: Orthopedics;  Laterality: Left;  ANESTHESIA:  GENERAL/INTERSCALENE BLOCK    reports that he has never smoked. He has never used smokeless tobacco. He reports current alcohol use of about 6.0 standard drinks of alcohol per week. He reports that he does not use drugs. family history includes Asthma in his mother; Diabetes in his mother; Heart attack (age of onset: 30) in his father; Hypertension in his father and mother. No Known Allergies Current  Outpatient Medications on File Prior to Visit  Medication Sig Dispense Refill  . albuterol (VENTOLIN HFA) 108 (90 Base) MCG/ACT inhaler Inhale 2 puffs into the lungs every 4 (four) hours as needed for wheezing or shortness of breath. 18 g 3  . amLODipine (NORVASC) 5 MG tablet Take 1 tablet (5 mg total) by mouth daily. 90 tablet 3  . aspirin EC 81 MG tablet Take 1 tablet (81 mg total) by mouth daily.    . budesonide-formoterol (SYMBICORT) 160-4.5 MCG/ACT inhaler Inhale 2 puffs into the lungs 2 (two) times daily. 3 Inhaler 3  . hydrochlorothiazide (HYDRODIURIL) 25 MG tablet Take 0.5 tablets (12.5 mg total) by mouth daily. 45 tablet 3  . traZODone (DESYREL) 50 MG tablet Take 1 tablet (50 mg total) by mouth at bedtime as needed for sleep. 90 tablet 1  . triamcinolone (NASACORT) 55 MCG/ACT AERO nasal inhaler Place 2 sprays into the nose daily. 1 Inhaler 12   No current facility-administered medications on file prior to visit.    Review of Systems  Constitutional: Negative for other unusual diaphoresis or sweats HENT: Negative for ear discharge or swelling Eyes: Negative for other worsening visual disturbances Respiratory: Negative for stridor or other swelling  Gastrointestinal: Negative for worsening distension or other blood Genitourinary: Negative for retention or other urinary change Musculoskeletal: Negative for other MSK pain or swelling Skin: Negative for color change or other new lesions Neurological: Negative for worsening tremors and other numbness  Psychiatric/Behavioral: Negative for worsening agitation or other fatigue All otherwise neg per pt    Objective:   Physical Exam BP 134/90 (BP Location: Left Arm, Patient Position: Sitting, Cuff Size: Large)   Pulse 71   Temp 98.7 F (37.1 C) (Oral)   Ht 6' (1.829 m)   Wt 279 lb (126.6 kg)   SpO2 96%   BMI 37.84 kg/m  VS noted,  Constitutional: Pt appears in NAD HENT: Head: NCAT.  Right Ear: External ear normal.  Left Ear:  External ear normal.  Eyes: . Pupils are equal, round, and reactive to light. Conjunctivae and EOM are normal Nose: without d/c or deformity Neck: Neck supple. Gross normal ROM Cardiovascular: Normal rate and regular rhythm.   Pulmonary/Chest: Effort normal and breath sounds without rales or wheezing.  Abd:  Soft, NT, ND, + BS, no organomegaly Neurological: Pt is alert. At baseline orientation, motor grossly intact Skin: Skin is warm. No rashes, other new lesions, no LE edema Psychiatric: Pt behavior is normal without agitation  All otherwise neg per pt Lab Results  Component Value Date   WBC 4.1 02/08/2017   HGB 14.6 02/08/2017   HCT 41.8 02/08/2017   PLT 186 02/08/2017   GLUCOSE 96 02/08/2017   CHOL 206 (H) 02/21/2013   TRIG 96.0 02/21/2013   HDL 56.10 02/21/2013   LDLDIRECT 134.1 02/21/2013   ALT 25 02/08/2017   AST 25 02/08/2017   NA 139 02/08/2017   K 3.6 02/08/2017   CL 104 02/08/2017   CREATININE 1.03 02/08/2017   BUN 11 02/08/2017   CO2 25 02/08/2017   HGBA1C 6.3 05/01/2016      Assessment & Plan:

## 2019-04-05 ENCOUNTER — Encounter: Payer: Self-pay | Admitting: Internal Medicine

## 2019-04-05 DIAGNOSIS — K439 Ventral hernia without obstruction or gangrene: Secondary | ICD-10-CM | POA: Insufficient documentation

## 2019-04-05 NOTE — Assessment & Plan Note (Signed)
stable overall by history and exam, recent data reviewed with pt, and pt to continue medical treatment as before,  to f/u any worsening symptoms or concerns  

## 2019-04-29 ENCOUNTER — Other Ambulatory Visit: Payer: Self-pay

## 2019-04-29 DIAGNOSIS — Z20822 Contact with and (suspected) exposure to covid-19: Secondary | ICD-10-CM

## 2019-05-01 LAB — NOVEL CORONAVIRUS, NAA: SARS-CoV-2, NAA: NOT DETECTED

## 2019-06-03 ENCOUNTER — Other Ambulatory Visit: Payer: Self-pay

## 2019-06-03 ENCOUNTER — Ambulatory Visit (INDEPENDENT_AMBULATORY_CARE_PROVIDER_SITE_OTHER): Payer: Medicare Other | Admitting: Internal Medicine

## 2019-06-03 ENCOUNTER — Encounter: Payer: Self-pay | Admitting: Internal Medicine

## 2019-06-03 VITALS — BP 124/84 | HR 77 | Temp 98.8°F | Ht 72.0 in | Wt 284.0 lb

## 2019-06-03 DIAGNOSIS — J45909 Unspecified asthma, uncomplicated: Secondary | ICD-10-CM

## 2019-06-03 DIAGNOSIS — R739 Hyperglycemia, unspecified: Secondary | ICD-10-CM | POA: Diagnosis not present

## 2019-06-03 DIAGNOSIS — E785 Hyperlipidemia, unspecified: Secondary | ICD-10-CM

## 2019-06-03 DIAGNOSIS — I10 Essential (primary) hypertension: Secondary | ICD-10-CM

## 2019-06-03 DIAGNOSIS — L304 Erythema intertrigo: Secondary | ICD-10-CM | POA: Diagnosis not present

## 2019-06-03 MED ORDER — HYDROCHLOROTHIAZIDE 25 MG PO TABS: 12.5000 mg | ORAL_TABLET | Freq: Every day | ORAL | 3 refills | Status: DC

## 2019-06-03 MED ORDER — CLOTRIMAZOLE-BETAMETHASONE 1-0.05 % EX CREA: TOPICAL_CREAM | CUTANEOUS | 1 refills | Status: DC

## 2019-06-03 MED ORDER — AMLODIPINE BESYLATE 5 MG PO TABS: 5.0000 mg | ORAL_TABLET | Freq: Every day | ORAL | 3 refills | Status: DC

## 2019-06-03 MED ORDER — FLUCONAZOLE 100 MG PO TABS: 100.0000 mg | ORAL_TABLET | Freq: Every day | ORAL | 0 refills | Status: AC

## 2019-06-03 MED ORDER — ALBUTEROL SULFATE HFA 108 (90 BASE) MCG/ACT IN AERS: 2.0000 | INHALATION_SPRAY | RESPIRATORY_TRACT | 3 refills | Status: DC | PRN

## 2019-06-03 NOTE — Patient Instructions (Signed)
Please take all new medication as prescribed - the cream as needed, and the pills for one week  Please continue all other medications as before, and refills have been done if requested.  Please have the pharmacy call with any other refills you may need.  Please continue your efforts at being more active, low cholesterol diet, and weight control.  Please keep your appointments with your specialists as you may have planned

## 2019-06-03 NOTE — Progress Notes (Signed)
Subjective:    Patient ID: Benjamin Hamilton, male    DOB: 08-15-1950, 68 y.o.   MRN: [id number]  HPI  Here to f/u; overall doing ok,  Pt denies chest pain, increasing sob or doe, wheezing, orthopnea, PND, increased LE swelling, palpitations, dizziness or syncope.  Pt denies new neurological symptoms such as new headache, or facial or extremity weakness or numbness.  Pt denies polydipsia, polyuria, or low sugar episode.  Pt states overall good compliance with meds, mostly trying to follow appropriate diet, with wt overall stable,  but little exercise however.  Does have mild bilat groin nontender macerated itchy erythema ongoing for several months, tends to sweat a lot and not able to change pants.   Past Medical History:  Diagnosis Date  . Asthma   . Chest pain    a. cath 7/11: dLAD 30%, EF 55-60% (done after abnl Myoview with 5 bts polymorphic VT)  . Hypercholesteremia   . Hypertension   . SOB (shortness of breath)    a. PFTs ok; b. Chest CT 11/11 with bronchial wall thickening, no mass or parenchymal disease;  c. seen by Dr. Gwenette Greet - ? environment asthma vs. GERD (PPI no help)  . Wears dentures    top   Past Surgical History:  Procedure Laterality Date  . CARDIAC CATHETERIZATION  2011  . HERNIA REPAIR  1988   right ing   . OPEN REDUCTION INTERNAL FIXATION (ORIF) DISTAL RADIAL FRACTURE Left 06/15/2014   Procedure: OPEN REDUCTION INTERNAL FIXATION (ORIF) LEFT DISTAL RADIAL FRACTURE;  Surgeon: Jolyn Nap, MD;  Location: [facility];  Service: Orthopedics;  Laterality: Left;  ANESTHESIA:  GENERAL/INTERSCALENE BLOCK    reports that he has never smoked. He has never used smokeless tobacco. He reports current alcohol use of about 6.0 standard drinks of alcohol per week. He reports that he does not use drugs. family history includes Asthma in his mother; Diabetes in his mother; Heart attack (age of onset: 23) in his father; Hypertension in his father and mother. No Known  Allergies Current Outpatient Medications on File Prior to Visit  Medication Sig Dispense Refill  . aspirin EC 81 MG tablet Take 1 tablet (81 mg total) by mouth daily.    . budesonide-formoterol (SYMBICORT) 160-4.5 MCG/ACT inhaler Inhale 2 puffs into the lungs 2 (two) times daily. 3 Inhaler 3  . traZODone (DESYREL) 50 MG tablet Take 1 tablet (50 mg total) by mouth at bedtime as needed for sleep. 90 tablet 1  . triamcinolone (NASACORT) 55 MCG/ACT AERO nasal inhaler Place 2 sprays into the nose daily. 1 Inhaler 12   No current facility-administered medications on file prior to visit.    Review of Systems  Constitutional: Negative for other unusual diaphoresis or sweats HENT: Negative for ear discharge or swelling Eyes: Negative for other worsening visual disturbances Respiratory: Negative for stridor or other swelling  Gastrointestinal: Negative for worsening distension or other blood Genitourinary: Negative for retention or other urinary change Musculoskeletal: Negative for other MSK pain or swelling Skin: Negative for color change or other new lesions Neurological: Negative for worsening tremors and other numbness  Psychiatric/Behavioral: Negative for worsening agitation or other fatigue All otherwise neg per pt    Objective:   Physical Exam BP 124/84   Pulse 77   Temp 98.8 F (37.1 C) (Oral)   Ht 6' (1.829 m)   Wt 284 lb (128.8 kg)   SpO2 95%   BMI 38.52 kg/m  VS noted,  Constitutional: Pt appears in NAD HENT: Head: NCAT.  Right Ear: External ear normal.  Left Ear: External ear normal.  Eyes: . Pupils are equal, round, and reactive to light. Conjunctivae and EOM are normal Nose: without d/c or deformity Neck: Neck supple. Gross normal ROM Cardiovascular: Normal rate and regular rhythm.   Pulmonary/Chest: Effort normal and breath sounds without rales or wheezing.  Abd:  Soft, NT, ND, + BS, no organomegaly Neurological: Pt is alert. At baseline orientation, motor grossly  intact Skin: Skin is warm. + slight macerated nontender erythem rashes bilat groin and inner thighs, no other new lesions, no LE edema Psychiatric: Pt behavior is normal without agitation  All otherwise neg per pt Lab Results  Component Value Date   WBC 4.1 02/08/2017   HGB 14.6 02/08/2017   HCT 41.8 02/08/2017   PLT 186 02/08/2017   GLUCOSE 96 02/08/2017   CHOL 206 (H) 02/21/2013   TRIG 96.0 02/21/2013   HDL 56.10 02/21/2013   LDLDIRECT 134.1 02/21/2013   ALT 25 02/08/2017   AST 25 02/08/2017   NA 139 02/08/2017   K 3.6 02/08/2017   CL 104 02/08/2017   CREATININE 1.03 02/08/2017   BUN 11 02/08/2017   CO2 25 02/08/2017   HGBA1C 6.3 05/01/2016         Assessment & Plan:

## 2019-06-08 ENCOUNTER — Encounter: Payer: Self-pay | Admitting: Internal Medicine

## 2019-06-08 NOTE — Assessment & Plan Note (Signed)
stable overall by history and exam, recent data reviewed with pt, and pt to continue medical treatment as before,  to f/u any worsening symptoms or concerns  

## 2019-06-08 NOTE — Assessment & Plan Note (Signed)
Mild to mod, for antifungal med and cream asd,  to f/u any worsening symptoms or concerns

## 2019-08-04 ENCOUNTER — Ambulatory Visit: Payer: Medicare Other | Admitting: Internal Medicine

## 2019-08-12 ENCOUNTER — Other Ambulatory Visit: Payer: Self-pay

## 2019-08-12 ENCOUNTER — Ambulatory Visit (INDEPENDENT_AMBULATORY_CARE_PROVIDER_SITE_OTHER): Payer: Medicare Other | Admitting: Internal Medicine

## 2019-08-12 ENCOUNTER — Encounter: Payer: Self-pay | Admitting: Internal Medicine

## 2019-08-12 VITALS — BP 140/80 | HR 77 | Temp 97.4°F | Ht 72.0 in | Wt 284.0 lb

## 2019-08-12 DIAGNOSIS — I10 Essential (primary) hypertension: Secondary | ICD-10-CM | POA: Diagnosis not present

## 2019-08-12 DIAGNOSIS — R739 Hyperglycemia, unspecified: Secondary | ICD-10-CM | POA: Diagnosis not present

## 2019-08-12 DIAGNOSIS — R21 Rash and other nonspecific skin eruption: Secondary | ICD-10-CM | POA: Insufficient documentation

## 2019-08-12 MED ORDER — CLOTRIMAZOLE-BETAMETHASONE 1-0.05 % EX CREA: TOPICAL_CREAM | CUTANEOUS | 1 refills | Status: DC

## 2019-08-12 MED ORDER — KETOCONAZOLE 200 MG PO TABS: 200.0000 mg | ORAL_TABLET | Freq: Every day | ORAL | 0 refills | Status: DC

## 2019-08-12 MED ORDER — TRAZODONE HCL 50 MG PO TABS: 50.0000 mg | ORAL_TABLET | Freq: Every evening | ORAL | 3 refills | Status: DC | PRN

## 2019-08-12 NOTE — Progress Notes (Signed)
Subjective:    Patient ID: Benjamin Hamilton, male    DOB: 1950/11/02, 69 y.o.   MRN: [id number]  HPI  Here with rash with darkened itchy areas to the cheest mild constant for 3 wks, also worsening rash to bilat groin area as well, without fever, chills, pain drainage. Pt denies chest pain, increased sob or doe, wheezing, orthopnea, PND, increased LE swelling, palpitations, dizziness or syncope.  Pt denies new neurological symptoms such as new headache, or facial or extremity weakness or numbness   Pt denies polydipsia, polyuria, or low sugar symptoms such as weakness or confusion improved with po intake.  Pt states overall good compliance with meds Past Medical History:  Diagnosis Date  . Asthma   . Chest pain    a. cath 7/11: dLAD 30%, EF 55-60% (done after abnl Myoview with 5 bts polymorphic VT)  . Hypercholesteremia   . Hypertension   . SOB (shortness of breath)    a. PFTs ok; b. Chest CT 11/11 with bronchial wall thickening, no mass or parenchymal disease;  c. seen by Dr. Gwenette Greet - ? environment asthma vs. GERD (PPI no help)  . Wears dentures    top   Past Surgical History:  Procedure Laterality Date  . CARDIAC CATHETERIZATION  2011  . HERNIA REPAIR  1988   right ing   . OPEN REDUCTION INTERNAL FIXATION (ORIF) DISTAL RADIAL FRACTURE Left 06/15/2014   Procedure: OPEN REDUCTION INTERNAL FIXATION (ORIF) LEFT DISTAL RADIAL FRACTURE;  Surgeon: Jolyn Nap, MD;  Location: [facility];  Service: Orthopedics;  Laterality: Left;  ANESTHESIA:  GENERAL/INTERSCALENE BLOCK    reports that he has never smoked. He has never used smokeless tobacco. He reports current alcohol use of about 6.0 standard drinks of alcohol per week. He reports that he does not use drugs. family history includes Asthma in his mother; Diabetes in his mother; Heart attack (age of onset: 64) in his father; Hypertension in his father and mother. No Known Allergies Current Outpatient Medications on File  Prior to Visit  Medication Sig Dispense Refill  . albuterol (VENTOLIN HFA) 108 (90 Base) MCG/ACT inhaler Inhale 2 puffs into the lungs every 4 (four) hours as needed for wheezing or shortness of breath. 18 g 3  . amLODipine (NORVASC) 5 MG tablet Take 1 tablet (5 mg total) by mouth daily. 90 tablet 3  . aspirin EC 81 MG tablet Take 1 tablet (81 mg total) by mouth daily.    . budesonide-formoterol (SYMBICORT) 160-4.5 MCG/ACT inhaler Inhale 2 puffs into the lungs 2 (two) times daily. 3 Inhaler 3  . hydrochlorothiazide (HYDRODIURIL) 25 MG tablet Take 0.5 tablets (12.5 mg total) by mouth daily. 45 tablet 3  . triamcinolone (NASACORT) 55 MCG/ACT AERO nasal inhaler Place 2 sprays into the nose daily. 1 Inhaler 12   No current facility-administered medications on file prior to visit.   Review of Systems All otherwise neg per pt     Objective:   Physical Exam BP 140/80 (BP Location: Left Arm, Patient Position: Sitting, Cuff Size: Large)   Pulse 77   Temp (!) 97.4 F (36.3 C) (Oral)   Ht 6' (1.829 m)   Wt 284 lb (128.8 kg)   SpO2 96%   BMI 38.52 kg/m  VS noted,  Constitutional: Pt appears in NAD HENT: Head: NCAT.  Right Ear: External ear normal.  Left Ear: External ear normal.  Eyes: . Pupils are equal, round, and reactive to light. Conjunctivae and EOM are  normal Nose: without d/c or deformity Neck: Neck supple. Gross normal ROM Cardiovascular: Normal rate and regular rhythm.   Pulmonary/Chest: Effort normal and breath sounds without rales or wheezing.  Neurological: Pt is alert. At baseline orientation, motor grossly intact Skin: Skin is warm. + small oval dark aras to bialt upper chest and groin areas,rashes, other new lesions, no LE edema Psychiatric: Pt behavior is normal without agitation  lallj Lab Results  Component Value Date   WBC 4.1 02/08/2017   HGB 14.6 02/08/2017   HCT 41.8 02/08/2017   PLT 186 02/08/2017   GLUCOSE 96 02/08/2017   CHOL 206 (H) 02/21/2013   TRIG  96.0 02/21/2013   HDL 56.10 02/21/2013   LDLDIRECT 134.1 02/21/2013   ALT 25 02/08/2017   AST 25 02/08/2017   NA 139 02/08/2017   K 3.6 02/08/2017   CL 104 02/08/2017   CREATININE 1.03 02/08/2017   BUN 11 02/08/2017   CO2 25 02/08/2017   HGBA1C 6.3 05/01/2016        Assessment & Plan:

## 2019-08-12 NOTE — Patient Instructions (Signed)
Please take all new medication as prescribed - the antifungal pills and the cream as needed  Please continue all other medications as before, and refills have been done if requested.  Please have the pharmacy call with any other refills you may need.  Please continue your efforts at being more active, low cholesterol diet, and weight control.  Please keep your appointments with your specialists as you may have planned

## 2019-08-13 ENCOUNTER — Encounter: Payer: Self-pay | Admitting: Internal Medicine

## 2019-08-13 NOTE — Assessment & Plan Note (Signed)
stable overall by history and exam, recent data reviewed with pt, and pt to continue medical treatment as before,  to f/u any worsening symptoms or concerns  

## 2019-08-13 NOTE — Assessment & Plan Note (Addendum)
C/w likely fungal, Mild to mod, for ketoconozole and lotrisone course,  to f/u any worsening symptoms or concerns  I spent 30 minutes preparing to see the patient by review of recent labs, imaging and procedures, obtaining and reviewing separately obtained history, communicating with the patient and family or caregiver, ordering medications, tests or procedures, and documenting clinical information in the EHR including the differential Dx, treatment, and any further evaluation and other management of rash, HTN, hyperglycemia

## 2019-08-14 ENCOUNTER — Telehealth: Payer: Self-pay | Admitting: *Deleted

## 2019-08-14 MED ORDER — FLUCONAZOLE 100 MG PO TABS: 100.0000 mg | ORAL_TABLET | Freq: Every day | ORAL | 0 refills | Status: DC

## 2019-08-14 NOTE — Telephone Encounter (Signed)
Ok to change to diflucan 100 mg per day for 7 days

## 2019-08-14 NOTE — Telephone Encounter (Signed)
Ketoconazole 200 mg is not covered. Any other alternatives?

## 2019-08-14 NOTE — Telephone Encounter (Signed)
Called pt to inform of below. No answer/unable to leave vm.

## 2019-09-19 ENCOUNTER — Other Ambulatory Visit: Payer: Self-pay

## 2019-09-19 ENCOUNTER — Ambulatory Visit (INDEPENDENT_AMBULATORY_CARE_PROVIDER_SITE_OTHER): Payer: Medicare Other | Admitting: Internal Medicine

## 2019-09-19 ENCOUNTER — Encounter: Payer: Self-pay | Admitting: Internal Medicine

## 2019-09-19 VITALS — BP 140/84 | HR 74 | Temp 97.4°F | Ht 72.0 in | Wt 288.8 lb

## 2019-09-19 DIAGNOSIS — R739 Hyperglycemia, unspecified: Secondary | ICD-10-CM | POA: Diagnosis not present

## 2019-09-19 DIAGNOSIS — G4700 Insomnia, unspecified: Secondary | ICD-10-CM

## 2019-09-19 DIAGNOSIS — R21 Rash and other nonspecific skin eruption: Secondary | ICD-10-CM

## 2019-09-19 DIAGNOSIS — E785 Hyperlipidemia, unspecified: Secondary | ICD-10-CM | POA: Diagnosis not present

## 2019-09-19 DIAGNOSIS — Z114 Encounter for screening for human immunodeficiency virus [HIV]: Secondary | ICD-10-CM

## 2019-09-19 DIAGNOSIS — Z1159 Encounter for screening for other viral diseases: Secondary | ICD-10-CM

## 2019-09-19 DIAGNOSIS — I10 Essential (primary) hypertension: Secondary | ICD-10-CM | POA: Diagnosis not present

## 2019-09-19 MED ORDER — ZOLPIDEM TARTRATE 5 MG PO TABS: 5.0000 mg | ORAL_TABLET | Freq: Every evening | ORAL | 1 refills | Status: DC | PRN

## 2019-09-19 MED ORDER — AMLODIPINE BESYLATE 5 MG PO TABS: 5.0000 mg | ORAL_TABLET | Freq: Every day | ORAL | 3 refills | Status: DC

## 2019-09-19 MED ORDER — HYDROCHLOROTHIAZIDE 25 MG PO TABS: 12.5000 mg | ORAL_TABLET | Freq: Every day | ORAL | 3 refills | Status: DC

## 2019-09-19 NOTE — Progress Notes (Signed)
Subjective:    Patient ID: Benjamin Hamilton, male    DOB: 06-29-1951, 69 y.o.   MRN: [id number]  HPI  Here to f/u; overall doing ok,  Pt denies chest pain, increasing sob or doe, wheezing, orthopnea, PND, increased LE swelling, palpitations, dizziness or syncope.  Pt denies new neurological symptoms such as new headache, or facial or extremity weakness or numbness.  Pt denies polydipsia, polyuria, or low sugar episode.  Pt states overall good compliance with meds, mostly trying to follow appropriate diet, with wt overall stable,  but little exercise however.  Having much difficulty with getting to sleep nightly for several months.  ALso rash to chest persists, asks for derm referral Past Medical History:  Diagnosis Date  . Asthma   . Chest pain    a. cath 7/11: dLAD 30%, EF 55-60% (done after abnl Myoview with 5 bts polymorphic VT)  . Hypercholesteremia   . Hypertension   . SOB (shortness of breath)    a. PFTs ok; b. Chest CT 11/11 with bronchial wall thickening, no mass or parenchymal disease;  c. seen by Dr. Gwenette Greet - ? environment asthma vs. GERD (PPI no help)  . Wears dentures    top   Past Surgical History:  Procedure Laterality Date  . CARDIAC CATHETERIZATION  2011  . HERNIA REPAIR  1988   right ing   . OPEN REDUCTION INTERNAL FIXATION (ORIF) DISTAL RADIAL FRACTURE Left 06/15/2014   Procedure: OPEN REDUCTION INTERNAL FIXATION (ORIF) LEFT DISTAL RADIAL FRACTURE;  Surgeon: Jolyn Nap, MD;  Location: [facility];  Service: Orthopedics;  Laterality: Left;  ANESTHESIA:  GENERAL/INTERSCALENE BLOCK    reports that he has never smoked. He has never used smokeless tobacco. He reports current alcohol use of about 6.0 standard drinks of alcohol per week. He reports that he does not use drugs. family history includes Asthma in his mother; Diabetes in his mother; Heart attack (age of onset: 30) in his father; Hypertension in his father and mother. No Known Allergies Current  Outpatient Medications on File Prior to Visit  Medication Sig Dispense Refill  . albuterol (VENTOLIN HFA) 108 (90 Base) MCG/ACT inhaler Inhale 2 puffs into the lungs every 4 (four) hours as needed for wheezing or shortness of breath. 18 g 3  . aspirin EC 81 MG tablet Take 1 tablet (81 mg total) by mouth daily.    . budesonide-formoterol (SYMBICORT) 160-4.5 MCG/ACT inhaler Inhale 2 puffs into the lungs 2 (two) times daily. 3 Inhaler 3  . clotrimazole-betamethasone (LOTRISONE) cream Use as directed twice daily as needed to affected area 45 g 1  . fluconazole (DIFLUCAN) 100 MG tablet Take 1 tablet (100 mg total) by mouth daily. 7 tablet 0  . ketoconazole (NIZORAL) 200 MG tablet Take 1 tablet (200 mg total) by mouth daily. 7 tablet 0  . triamcinolone (NASACORT) 55 MCG/ACT AERO nasal inhaler Place 2 sprays into the nose daily. 1 Inhaler 12   No current facility-administered medications on file prior to visit.   Review of Systems All otherwise neg per pt    Objective:   Physical Exam BP 140/84   Pulse 74   Temp (!) 97.4 F (36.3 C)   Ht 6' (1.829 m)   Wt 288 lb 12.8 oz (131 kg)   SpO2 98%   BMI 39.17 kg/m  VS noted,  Constitutional: Pt appears in NAD HENT: Head: NCAT.  Right Ear: External ear normal.  Left Ear: External ear normal.  Eyes: .  Pupils are equal, round, and reactive to light. Conjunctivae and EOM are normal Nose: without d/c or deformity Neck: Neck supple. Gross normal ROM Cardiovascular: Normal rate and regular rhythm.   Pulmonary/Chest: Effort normal and breath sounds without rales or wheezing.  Abd:  Soft, NT, ND, + BS, no organomegaly Neurological: Pt is alert. At baseline orientation, motor grossly intact Skin: Skin is warm. No rashes, other new lesions, no LE edema Psychiatric: Pt behavior is normal without agitation  All otherwise neg per pt  Lab Results  Component Value Date   WBC 4.1 02/08/2017   HGB 14.6 02/08/2017   HCT 41.8 02/08/2017   PLT 186  02/08/2017   GLUCOSE 96 02/08/2017   CHOL 206 (H) 02/21/2013   TRIG 96.0 02/21/2013   HDL 56.10 02/21/2013   LDLDIRECT 134.1 02/21/2013   ALT 25 02/08/2017   AST 25 02/08/2017   NA 139 02/08/2017   K 3.6 02/08/2017   CL 104 02/08/2017   CREATININE 1.03 02/08/2017   BUN 11 02/08/2017   CO2 25 02/08/2017   HGBA1C 6.3 05/01/2016       Assessment & Plan:

## 2019-09-19 NOTE — Patient Instructions (Signed)
Please take all new medication as prescribed - the ambien for sleep  You will be contacted regarding the referral for: dermatology  Please continue all other medications as before, and refills have been done if requested.  Please have the pharmacy call with any other refills you may need.  Please continue your efforts at being more active, low cholesterol diet, and weight control.  Please keep your appointments with your specialists as you may have planned  Please make an Appointment to return in 6 months, or sooner if needed

## 2019-09-20 ENCOUNTER — Encounter: Payer: Self-pay | Admitting: Internal Medicine

## 2019-09-20 NOTE — Assessment & Plan Note (Addendum)
Ok for derm referral,  to f/u any worsening symptoms or concerns  I spent 31 minutes in preparing to see the patient by review of recent labs, imaging and procedures, obtaining and reviewing separately obtained history, communicating with the patient and family or caregiver, ordering medications, tests or procedures, and documenting clinical information in the EHR including the differential Dx, treatment, and any further evaluation and other management of rash, insomnia, hyperglycemia, HLD, HTN,

## 2019-09-20 NOTE — Assessment & Plan Note (Signed)
stable overall by history and exam, recent data reviewed with pt, and pt to continue medical treatment as before,  to f/u any worsening symptoms or concerns  

## 2019-09-20 NOTE — Assessment & Plan Note (Signed)
Ok to change to [facility] [facility] qhs prn,  to f/u any worsening symptoms or concerns

## 2019-11-28 ENCOUNTER — Ambulatory Visit: Payer: Medicare Other | Admitting: Internal Medicine

## 2019-11-28 ENCOUNTER — Encounter (HOSPITAL_COMMUNITY): Payer: Self-pay

## 2019-11-28 ENCOUNTER — Other Ambulatory Visit: Payer: Self-pay

## 2019-11-28 ENCOUNTER — Emergency Department (HOSPITAL_COMMUNITY)
Admission: EM | Admit: 2019-11-28 | Discharge: 2019-11-28 | Disposition: A | Discharge status: Home / Self Care | Payer: Medicare Other | Attending: Emergency Medicine | Admitting: Emergency Medicine

## 2019-11-28 DIAGNOSIS — Z79899 Other long term (current) drug therapy: Secondary | ICD-10-CM | POA: Diagnosis not present

## 2019-11-28 DIAGNOSIS — Z7982 Long term (current) use of aspirin: Secondary | ICD-10-CM | POA: Diagnosis not present

## 2019-11-28 DIAGNOSIS — J45909 Unspecified asthma, uncomplicated: Secondary | ICD-10-CM | POA: Insufficient documentation

## 2019-11-28 DIAGNOSIS — I10 Essential (primary) hypertension: Secondary | ICD-10-CM | POA: Diagnosis not present

## 2019-11-28 DIAGNOSIS — R21 Rash and other nonspecific skin eruption: Secondary | ICD-10-CM | POA: Diagnosis not present

## 2019-11-28 NOTE — ED Triage Notes (Signed)
Patient c/o rash on bilateral inner thighs. Patient states he has been going to a dermatologist and received meds, but states the meds are not working.

## 2019-11-28 NOTE — ED Provider Notes (Signed)
[facility] DEPT Provider Note   CSN: [id number] Arrival date & time: 11/28/19  0815     History Chief Complaint  Patient presents with  . Rash    Benjamin Hamilton is a 69 y.o. male.  69 year old male presents with rash to his inner thighs x2 months.  Has been seen by dermatology and was placed on antifungal cream which she states is not helping.  Denies any fever or chills.  Denies any extension of the rash from his current site.  Does note pruritus.  No other treatments used prior to arrival        Past Medical History:  Diagnosis Date  . Asthma   . Chest pain    a. cath 7/11: dLAD 30%, EF 55-60% (done after abnl Myoview with 5 bts polymorphic VT)  . Hypercholesteremia   . Hypertension   . SOB (shortness of breath)    a. PFTs ok; b. Chest CT 11/11 with bronchial wall thickening, no mass or parenchymal disease;  c. seen by Dr. Gwenette Greet - ? environment asthma vs. GERD (PPI no help)  . Wears dentures    top    Patient Active Problem List   Diagnosis Date Noted  . Rash 08/12/2019  . Intertrigo 06/03/2019  . Ventral hernia 04/05/2019  . Hyperglycemia 03/19/2018  . HLD (hyperlipidemia) 03/19/2018  . Insomnia 12/15/2017  . Preventative health care 12/13/2017  . Hearing impairment 10/02/2016  . Allergic rhinitis 11/21/2012  . Paroxysmal ventricular tachycardia ([place]) 05/27/2010  . Essential hypertension 10/19/2009  . Asthma, intrinsic 10/19/2009    Past Surgical History:  Procedure Laterality Date  . CARDIAC CATHETERIZATION  2011  . HERNIA REPAIR  1988   right ing   . OPEN REDUCTION INTERNAL FIXATION (ORIF) DISTAL RADIAL FRACTURE Left 06/15/2014   Procedure: OPEN REDUCTION INTERNAL FIXATION (ORIF) LEFT DISTAL RADIAL FRACTURE;  Surgeon: Jolyn Nap, MD;  Location: [facility];  Service: Orthopedics;  Laterality: Left;  ANESTHESIA:  GENERAL/INTERSCALENE BLOCK       Family History  Problem Relation Age of Onset  .  Asthma Mother   . Diabetes Mother   . Hypertension Mother   . Heart attack Father 36  . Hypertension Father   . Colon cancer Neg Hx   . Colon polyps Neg Hx   . Esophageal cancer Neg Hx   . Rectal cancer Neg Hx   . Stomach cancer Neg Hx     Social History   Tobacco Use  . Smoking status: Never Smoker  . Smokeless tobacco: Never Used  Substance Use Topics  . Alcohol use: Yes    Alcohol/week: 6.0 standard drinks    Types: 6 Cans of beer per week    Comment: on weekends   . Drug use: No    Home Medications Prior to Admission medications   Medication Sig Start Date End Date Taking? Authorizing Provider  albuterol (VENTOLIN HFA) 108 (90 Base) MCG/ACT inhaler Inhale 2 puffs into the lungs every 4 (four) hours as needed for wheezing or shortness of breath. 06/03/19   Biagio Borg, MD  amLODipine (NORVASC) 5 MG tablet Take 1 tablet (5 mg total) by mouth daily. 09/19/19 09/18/20  Biagio Borg, MD  aspirin EC 81 MG tablet Take 1 tablet (81 mg total) by mouth daily. 02/14/13   Richardson Dopp T, PA-C  budesonide-formoterol (SYMBICORT) 160-4.5 MCG/ACT inhaler Inhale 2 puffs into the lungs 2 (two) times daily. 09/18/18   Biagio Borg, MD  clotrimazole-betamethasone (LOTRISONE) cream Use as directed twice daily as needed to affected area 08/12/19   Biagio Borg, MD  fluconazole (DIFLUCAN) 100 MG tablet Take 1 tablet (100 mg total) by mouth daily. 08/14/19   Biagio Borg, MD  hydrochlorothiazide (HYDRODIURIL) 25 MG tablet Take 0.5 tablets (12.5 mg total) by mouth daily. 09/19/19   Biagio Borg, MD  ketoconazole (NIZORAL) 200 MG tablet Take 1 tablet (200 mg total) by mouth daily. 08/12/19   Biagio Borg, MD  triamcinolone (NASACORT) 55 MCG/ACT AERO nasal inhaler Place 2 sprays into the nose daily. 09/18/18   Biagio Borg, MD  zolpidem (AMBIEN) 5 MG tablet Take 1 tablet (5 mg total) by mouth at bedtime as needed for sleep. 09/19/19   Biagio Borg, MD    Allergies    Patient has no known  allergies.  Review of Systems   Review of Systems  All other systems reviewed and are negative.   Physical Exam Updated Vital Signs BP (!) 154/94 (BP Location: Right Arm)   Pulse 94   Temp 98.6 F (37 C) (Oral)   Resp 18   Ht 1.854 m (6\' 1" )   Wt 127 kg   SpO2 97%   BMI 36.94 kg/m   Physical Exam Vitals and nursing note reviewed.  Constitutional:      General: He is not in acute distress.    Appearance: Normal appearance. He is well-developed. He is not toxic-appearing.  HENT:     Head: Normocephalic and atraumatic.  Eyes:     General: Lids are normal.     Conjunctiva/sclera: Conjunctivae normal.     Pupils: Pupils are equal, round, and reactive to light.  Neck:     Thyroid: No thyroid mass.     Trachea: No tracheal deviation.  Cardiovascular:     Rate and Rhythm: Normal rate and regular rhythm.     Heart sounds: Normal heart sounds. No murmur. No gallop.   Pulmonary:     Effort: Pulmonary effort is normal. No respiratory distress.     Breath sounds: Normal breath sounds. No stridor. No decreased breath sounds, wheezing, rhonchi or rales.  Abdominal:     General: Bowel sounds are normal. There is no distension.     Palpations: Abdomen is soft.     Tenderness: There is no abdominal tenderness. There is no rebound.  Musculoskeletal:        General: No tenderness. Normal range of motion.     Cervical back: Normal range of motion and neck supple.  Skin:    General: Skin is warm and dry.     Findings: No abrasion or rash.       Neurological:     Mental Status: He is alert and oriented to person, place, and time.     GCS: GCS eye subscore is 4. GCS verbal subscore is 5. GCS motor subscore is 6.     Cranial Nerves: No cranial nerve deficit.     Sensory: No sensory deficit.  Psychiatric:        Speech: Speech normal.        Behavior: Behavior normal.     ED Results / Procedures / Treatments   Labs (all labs ordered are listed, but only abnormal results are  displayed) Labs Reviewed - No data to display  EKG None  Radiology No results found.  Procedures Procedures (including critical care time)  Medications Ordered in ED Medications - No data to display  ED  Course  I have reviewed the triage vital signs and the nursing notes.  Pertinent labs & imaging results that were available during my care of the patient were reviewed by me and considered in my medical decision making (see chart for details).    MDM Rules/Calculators/A&P                      Patient started to follow back up with his dermatologist.  He has no signs of superinfection that I can see.  Have encouraged him to use Benadryl cream as needed Final Clinical Impression(s) / ED Diagnoses Final diagnoses:  None    Rx / DC Orders ED Discharge Orders    None       Lacretia Leigh, MD 11/28/19 [phone]

## 2019-11-28 NOTE — Discharge Instructions (Addendum)
Use Benadryl cream as directed and follow-up with your dermatologist

## 2019-12-05 ENCOUNTER — Ambulatory Visit (INDEPENDENT_AMBULATORY_CARE_PROVIDER_SITE_OTHER): Payer: Medicare Other | Admitting: Internal Medicine

## 2019-12-05 ENCOUNTER — Other Ambulatory Visit: Payer: Self-pay

## 2019-12-05 VITALS — BP 140/96 | HR 71 | Temp 99.0°F | Ht 73.0 in | Wt 272.0 lb

## 2019-12-05 DIAGNOSIS — L304 Erythema intertrigo: Secondary | ICD-10-CM

## 2019-12-05 DIAGNOSIS — R21 Rash and other nonspecific skin eruption: Secondary | ICD-10-CM | POA: Diagnosis not present

## 2019-12-05 DIAGNOSIS — E785 Hyperlipidemia, unspecified: Secondary | ICD-10-CM | POA: Diagnosis not present

## 2019-12-05 DIAGNOSIS — I10 Essential (primary) hypertension: Secondary | ICD-10-CM

## 2019-12-05 DIAGNOSIS — R739 Hyperglycemia, unspecified: Secondary | ICD-10-CM

## 2019-12-05 DIAGNOSIS — J45909 Unspecified asthma, uncomplicated: Secondary | ICD-10-CM

## 2019-12-05 MED ORDER — FLUCONAZOLE 100 MG PO TABS: 100.0000 mg | ORAL_TABLET | Freq: Every day | ORAL | 0 refills | Status: DC

## 2019-12-05 MED ORDER — CLOTRIMAZOLE-BETAMETHASONE 1-0.05 % EX CREA: TOPICAL_CREAM | CUTANEOUS | 1 refills | Status: DC

## 2019-12-05 NOTE — Progress Notes (Signed)
Subjective:    Patient ID: Benjamin Hamilton, male    DOB: 1951/04/25, 69 y.o.   MRN: [id number]  HPI  Here to f/u; overall doing ok,  Pt denies chest pain, increasing sob or doe, wheezing, orthopnea, PND, increased LE swelling, palpitations, dizziness or syncope.  Pt denies new neurological symptoms such as new headache, or facial or extremity weakness or numbness.  Pt denies polydipsia, polyuria, or low sugar episode.  Pt states overall good compliance with meds, mostly trying to follow appropriate diet, with wt overall stable,  Has seen dermatology and rx ciclopirox oint 0.77% to rash on toraso which seemed to help.  Then has developed worsening rash to the bilateral groin areas with itching; but was seemed to get worse with itching and spreading rash.  BP at home has been < 140/90 and declines any med change today Past Medical History:  Diagnosis Date  . Asthma   . Chest pain    a. cath 7/11: dLAD 30%, EF 55-60% (done after abnl Myoview with 5 bts polymorphic VT)  . Hypercholesteremia   . Hypertension   . SOB (shortness of breath)    a. PFTs ok; b. Chest CT 11/11 with bronchial wall thickening, no mass or parenchymal disease;  c. seen by Dr. Gwenette Greet - ? environment asthma vs. GERD (PPI no help)  . Wears dentures    top   Past Surgical History:  Procedure Laterality Date  . CARDIAC CATHETERIZATION  2011  . HERNIA REPAIR  1988   right ing   . OPEN REDUCTION INTERNAL FIXATION (ORIF) DISTAL RADIAL FRACTURE Left 06/15/2014   Procedure: OPEN REDUCTION INTERNAL FIXATION (ORIF) LEFT DISTAL RADIAL FRACTURE;  Surgeon: Jolyn Nap, MD;  Location: [facility];  Service: Orthopedics;  Laterality: Left;  ANESTHESIA:  GENERAL/INTERSCALENE BLOCK    reports that he has never smoked. He has never used smokeless tobacco. He reports current alcohol use of about 6.0 standard drinks of alcohol per week. He reports that he does not use drugs. family history includes Asthma in his mother;  Diabetes in his mother; Heart attack (age of onset: 18) in his father; Hypertension in his father and mother. No Known Allergies Current Outpatient Medications on File Prior to Visit  Medication Sig Dispense Refill  . albuterol (VENTOLIN HFA) 108 (90 Base) MCG/ACT inhaler Inhale 2 puffs into the lungs every 4 (four) hours as needed for wheezing or shortness of breath. 18 g 3  . amLODipine (NORVASC) 5 MG tablet Take 1 tablet (5 mg total) by mouth daily. 90 tablet 3  . aspirin EC 81 MG tablet Take 1 tablet (81 mg total) by mouth daily.    . budesonide-formoterol (SYMBICORT) 160-4.5 MCG/ACT inhaler Inhale 2 puffs into the lungs 2 (two) times daily. 3 Inhaler 3  . hydrochlorothiazide (HYDRODIURIL) 25 MG tablet Take 0.5 tablets (12.5 mg total) by mouth daily. 45 tablet 3  . ketoconazole (NIZORAL) 200 MG tablet Take 1 tablet (200 mg total) by mouth daily. 7 tablet 0  . triamcinolone (NASACORT) 55 MCG/ACT AERO nasal inhaler Place 2 sprays into the nose daily. 1 Inhaler 12  . zolpidem (AMBIEN) 5 MG tablet Take 1 tablet (5 mg total) by mouth at bedtime as needed for sleep. 90 tablet 1  . ciclopirox (LOPROX) 0.77 % cream APPLY TOPICALLY TO THE AFFECTED AREA TWICE DAILY     No current facility-administered medications on file prior to visit.   Review of Systems All otherwise neg per pt  Objective:   Physical Exam BP (!) 140/96 (BP Location: Left Arm, Patient Position: Sitting, Cuff Size: Large)   Pulse 71   Temp 99 F (37.2 C) (Oral)   Ht 6\' 1"  (1.854 m)   Wt 272 lb (123.4 kg)   SpO2 95%   BMI 35.89 kg/m  VS noted,  Constitutional: Pt appears in NAD HENT: Head: NCAT.  Right Ear: External ear normal.  Left Ear: External ear normal.  Eyes: . Pupils are equal, round, and reactive to light. Conjunctivae and EOM are normal Nose: without d/c or deformity Neck: Neck supple. Gross normal ROM Cardiovascular: Normal rate and regular rhythm.   Pulmonary/Chest: Effort normal and breath sounds  without rales or wheezing.  Abd:  Soft, NT, ND, + BS, no organomegaly Neurological: Pt is alert. At baseline orientation, motor grossly intact Skin: Skin is warm. Torso rash improved with several darkened ares of post inflammatory hyperpigmentation only, no other new lesions, no LE edema  But has bilat inner thigh/groin areas of typical erythema somewhat scaly Psychiatric: Pt behavior is normal without agitation  All otherwise neg per pt Lab Results  Component Value Date   WBC 4.1 02/08/2017   HGB 14.6 02/08/2017   HCT 41.8 02/08/2017   PLT 186 02/08/2017   GLUCOSE 96 02/08/2017   CHOL 206 (H) 02/21/2013   TRIG 96.0 02/21/2013   HDL 56.10 02/21/2013   LDLDIRECT 134.1 02/21/2013   ALT 25 02/08/2017   AST 25 02/08/2017   NA 139 02/08/2017   K 3.6 02/08/2017   CL 104 02/08/2017   CREATININE 1.03 02/08/2017   BUN 11 02/08/2017   CO2 25 02/08/2017   HGBA1C 6.3 05/01/2016         Assessment & Plan:

## 2019-12-05 NOTE — Patient Instructions (Addendum)
Please take all new medication as prescribed - the antifungal pill and cream as needed  Please continue all other medications as before, and refills have been done if requested.  Please have the pharmacy call with any other refills you may need.  Please continue your efforts at being more active, low cholesterol diet, and weight control.  Please keep your appointments with your specialists as you may have planned

## 2019-12-06 ENCOUNTER — Encounter: Payer: Self-pay | Admitting: Internal Medicine

## 2019-12-06 NOTE — Assessment & Plan Note (Signed)
Torso rash improved, cont tx as per derm

## 2019-12-06 NOTE — Assessment & Plan Note (Signed)
States BP at home < 140/90, cont current tx,  to f/u any worsening symptoms or concerns

## 2019-12-06 NOTE — Assessment & Plan Note (Addendum)
Ok for diflucan 100 qd x 7 day, lotrison asd,  to f/u any worsening symptoms or concerns  I spent 31 minutes in preparing to see the patient by review of recent labs, imaging and procedures, obtaining and reviewing separately obtained history, communicating with the patient and family or caregiver, ordering medications, tests or procedures, and documenting clinical information in the EHR including the differential Dx, treatment, and any further evaluation and other management of intertrigo, rash, asthma, htn, hld, hyperglycemia

## 2019-12-06 NOTE — Assessment & Plan Note (Signed)
stable overall by history and exam, recent data reviewed with pt, and pt to continue medical treatment as before,  to f/u any worsening symptoms or concerns  

## 2020-04-19 ENCOUNTER — Ambulatory Visit: Payer: Medicare Other | Admitting: Family

## 2020-05-06 ENCOUNTER — Encounter: Payer: Self-pay | Admitting: Internal Medicine

## 2020-05-06 ENCOUNTER — Ambulatory Visit (INDEPENDENT_AMBULATORY_CARE_PROVIDER_SITE_OTHER): Payer: Medicare Other

## 2020-05-06 ENCOUNTER — Other Ambulatory Visit: Payer: Self-pay

## 2020-05-06 ENCOUNTER — Ambulatory Visit (INDEPENDENT_AMBULATORY_CARE_PROVIDER_SITE_OTHER): Payer: Medicare Other | Admitting: Internal Medicine

## 2020-05-06 VITALS — BP 130/82 | HR 79 | Temp 98.2°F | Ht 73.0 in | Wt 283.0 lb

## 2020-05-06 DIAGNOSIS — N320 Bladder-neck obstruction: Secondary | ICD-10-CM

## 2020-05-06 DIAGNOSIS — J45909 Unspecified asthma, uncomplicated: Secondary | ICD-10-CM | POA: Diagnosis not present

## 2020-05-06 DIAGNOSIS — I10 Essential (primary) hypertension: Secondary | ICD-10-CM

## 2020-05-06 DIAGNOSIS — Z23 Encounter for immunization: Secondary | ICD-10-CM | POA: Diagnosis not present

## 2020-05-06 DIAGNOSIS — M25562 Pain in left knee: Secondary | ICD-10-CM

## 2020-05-06 DIAGNOSIS — R739 Hyperglycemia, unspecified: Secondary | ICD-10-CM | POA: Diagnosis not present

## 2020-05-06 DIAGNOSIS — E538 Deficiency of other specified B group vitamins: Secondary | ICD-10-CM

## 2020-05-06 DIAGNOSIS — E559 Vitamin D deficiency, unspecified: Secondary | ICD-10-CM | POA: Diagnosis not present

## 2020-05-06 DIAGNOSIS — M25561 Pain in right knee: Secondary | ICD-10-CM | POA: Diagnosis not present

## 2020-05-06 DIAGNOSIS — E785 Hyperlipidemia, unspecified: Secondary | ICD-10-CM

## 2020-05-06 LAB — HEPATIC FUNCTION PANEL
ALT: 11 U/L (ref 0–53)
AST: 14 U/L (ref 0–37)
Albumin: 3.9 g/dL (ref 3.5–5.2)
Alkaline Phosphatase: 92 U/L (ref 39–117)
Bilirubin, Direct: 0.2 mg/dL (ref 0.0–0.3)
Total Bilirubin: 0.9 mg/dL (ref 0.2–1.2)
Total Protein: 7.2 g/dL (ref 6.0–8.3)

## 2020-05-06 LAB — URINALYSIS, ROUTINE W REFLEX MICROSCOPIC
Bilirubin Urine: NEGATIVE
Ketones, ur: NEGATIVE
Leukocytes,Ua: NEGATIVE
Nitrite: NEGATIVE
Renal Epithel, UA: NONE SEEN
Specific Gravity, Urine: 1.015 (ref 1.000–1.030)
Urine Glucose: NEGATIVE
Urobilinogen, UA: 4 — AB (ref 0.0–1.0)
pH: 6 (ref 5.0–8.0)

## 2020-05-06 LAB — CBC WITH DIFFERENTIAL/PLATELET
Basophils Absolute: 0 10*3/uL (ref 0.0–0.1)
Basophils Relative: 0.5 % (ref 0.0–3.0)
Eosinophils Absolute: 0 10*3/uL (ref 0.0–0.7)
Eosinophils Relative: 0.7 % (ref 0.0–5.0)
HCT: 38.9 % — ABNORMAL LOW (ref 39.0–52.0)
Hemoglobin: 12.9 g/dL — ABNORMAL LOW (ref 13.0–17.0)
Lymphocytes Relative: 24.8 % (ref 12.0–46.0)
Lymphs Abs: 1.4 10*3/uL (ref 0.7–4.0)
MCHC: 33.2 g/dL (ref 30.0–36.0)
MCV: 84 fl (ref 78.0–100.0)
Monocytes Absolute: 0.6 10*3/uL (ref 0.1–1.0)
Monocytes Relative: 10.5 % (ref 3.0–12.0)
Neutro Abs: 3.6 10*3/uL (ref 1.4–7.7)
Neutrophils Relative %: 63.5 % (ref 43.0–77.0)
Platelets: 216 10*3/uL (ref 150.0–400.0)
RBC: 4.63 Mil/uL (ref 4.22–5.81)
RDW: 15 % (ref 11.5–15.5)
WBC: 5.6 10*3/uL (ref 4.0–10.5)

## 2020-05-06 LAB — LIPID PANEL
Cholesterol: 194 mg/dL (ref 0–200)
HDL: 40.7 mg/dL (ref >39.00)
LDL Cholesterol: 137 mg/dL — ABNORMAL HIGH (ref 0–99)
NonHDL: 153.37
Total CHOL/HDL Ratio: 5
Triglycerides: 84 mg/dL (ref 0.0–149.0)
VLDL: 16.8 mg/dL (ref 0.0–40.0)

## 2020-05-06 LAB — HEMOGLOBIN A1C: Hgb A1c MFr Bld: 7.3 % — ABNORMAL HIGH (ref 4.6–6.5)

## 2020-05-06 LAB — PSA: PSA: 281.05 ng/mL — ABNORMAL HIGH (ref 0.10–4.00)

## 2020-05-06 LAB — VITAMIN B12: Vitamin B-12: 472 pg/mL (ref 211–911)

## 2020-05-06 LAB — VITAMIN D 25 HYDROXY (VIT D DEFICIENCY, FRACTURES): VITD: 9.16 ng/mL — ABNORMAL LOW (ref 30.00–100.00)

## 2020-05-06 LAB — TSH: TSH: 1.72 u[IU]/mL (ref 0.35–4.50)

## 2020-05-06 MED ORDER — ALBUTEROL SULFATE HFA 108 (90 BASE) MCG/ACT IN AERS: 2.0000 | INHALATION_SPRAY | RESPIRATORY_TRACT | 3 refills | Status: AC | PRN

## 2020-05-06 MED ORDER — TRAMADOL HCL 50 MG PO TABS: 50.0000 mg | ORAL_TABLET | Freq: Four times a day (QID) | ORAL | 0 refills | Status: DC | PRN

## 2020-05-06 MED ORDER — ZOLPIDEM TARTRATE 5 MG PO TABS: 5.0000 mg | ORAL_TABLET | Freq: Every evening | ORAL | 1 refills | Status: DC | PRN

## 2020-05-06 MED ORDER — BUDESONIDE-FORMOTEROL FUMARATE 160-4.5 MCG/ACT IN AERO: 2.0000 | INHALATION_SPRAY | Freq: Two times a day (BID) | RESPIRATORY_TRACT | 3 refills | Status: AC

## 2020-05-06 MED ORDER — HYDROCHLOROTHIAZIDE 25 MG PO TABS: 12.5000 mg | ORAL_TABLET | Freq: Every day | ORAL | 3 refills | Status: DC

## 2020-05-06 MED ORDER — AMLODIPINE BESYLATE 5 MG PO TABS: 5.0000 mg | ORAL_TABLET | Freq: Every day | ORAL | 3 refills | Status: DC

## 2020-05-06 NOTE — Progress Notes (Signed)
Subjective:    Patient ID: Benjamin Hamilton, male    DOB: 08-06-1950, 69 y.o.   MRN: [id number]  HPI  Here to f/u; overall doing ok,  Pt denies chest pain, increasing sob or doe, wheezing, orthopnea, PND, increased LE swelling, palpitations, dizziness or syncope.  Pt denies new neurological symptoms such as new headache, or facial or extremity weakness or numbness.  Pt denies polydipsia, polyuria, or low sugar episode.  Pt states overall good compliance with meds, mostly trying to follow appropriate diet, with wt overall stable,  But little exercise due to 1 mo onset toothache like pain and intermittent swelling to both knees right > left, without hx of dvt. Past Medical History:  Diagnosis Date   Asthma    Chest pain    a. cath 7/11: dLAD 30%, EF 55-60% (done after abnl Myoview with 5 bts polymorphic VT)   Hypercholesteremia    Hypertension    SOB (shortness of breath)    a. PFTs ok; b. Chest CT 11/11 with bronchial wall thickening, no mass or parenchymal disease;  c. seen by Dr. Gwenette Greet - ? environment asthma vs. GERD (PPI no help)   Wears dentures    top   Past Surgical History:  Procedure Laterality Date   CARDIAC CATHETERIZATION  2011   [place]   right ing    OPEN REDUCTION INTERNAL FIXATION (ORIF) DISTAL RADIAL FRACTURE Left 06/15/2014   Procedure: OPEN REDUCTION INTERNAL FIXATION (ORIF) LEFT DISTAL RADIAL FRACTURE;  Surgeon: Jolyn Nap, MD;  Location: [facility];  Service: Orthopedics;  Laterality: Left;  ANESTHESIA:  GENERAL/INTERSCALENE BLOCK    reports that he has never smoked. He has never used smokeless tobacco. He reports current alcohol use of about 6.0 standard drinks of alcohol per week. He reports that he does not use drugs. family history includes Asthma in his mother; Diabetes in his mother; Heart attack (age of onset: 36) in his father; Hypertension in his father and mother. No Known Allergies Current Outpatient Medications on  File Prior to Visit  Medication Sig Dispense Refill   aspirin EC 81 MG tablet Take 1 tablet (81 mg total) by mouth daily.     ciclopirox (LOPROX) 0.77 % cream APPLY TOPICALLY TO THE AFFECTED AREA TWICE DAILY     clotrimazole-betamethasone (LOTRISONE) cream Use as directed twice daily as needed to affected area 45 g 1   ketoconazole (NIZORAL) 200 MG tablet Take 1 tablet (200 mg total) by mouth daily. 7 tablet 0   triamcinolone (NASACORT) 55 MCG/ACT AERO nasal inhaler Place 2 sprays into the nose daily. 1 Inhaler 12   No current facility-administered medications on file prior to visit.   Review of Systems All otherwise neg per pt    Objective:   Physical Exam BP 130/82 (BP Location: Left Arm, Patient Position: Sitting, Cuff Size: Large)    Pulse 79    Temp 98.2 F (36.8 C) (Oral)    Ht 6\' 1"  (1.854 m)    Wt 283 lb (128.4 kg)    SpO2 97%    BMI 37.34 kg/m  VS noted,  Constitutional: Pt appears in NAD HENT: Head: NCAT.  Right Ear: External ear normal.  Left Ear: External ear normal.  Eyes: . Pupils are equal, round, and reactive to light. Conjunctivae and EOM are normal Nose: without d/c or deformity Neck: Neck supple. Gross normal ROM Cardiovascular: Normal rate and regular rhythm.   Pulmonary/Chest: Effort normal and breath sounds without  rales or wheezing.  Abd:  Soft, NT, ND, + BS, no organomegaly Neurological: Pt is alert. At baseline orientation, motor grossly intact Skin: Skin is warm. No rashes, other new lesions, no LE edema Psychiatric: Pt behavior is normal without agitation ' All otherwise neg per pt Lab Results  Component Value Date   WBC 5.6 05/06/2020   HGB 12.9 (L) 05/06/2020   HCT 38.9 (L) 05/06/2020   PLT 216.0 05/06/2020   GLUCOSE 104 (H) 05/06/2020   CHOL 194 05/06/2020   TRIG 84.0 05/06/2020   HDL 40.70 05/06/2020   LDLDIRECT 134.1 02/21/2013   LDLCALC 137 (H) 05/06/2020   ALT 11 05/06/2020   AST 14 05/06/2020   NA 138 05/06/2020   K 3.5  05/06/2020   CL 100 05/06/2020   CREATININE 1.07 05/06/2020   BUN 13 05/06/2020   CO2 27 05/06/2020   TSH 1.72 05/06/2020   PSA 281.05 Verified by dilution. (H) 05/06/2020   HGBA1C 7.3 (H) 05/06/2020      Assessment & Plan:

## 2020-05-06 NOTE — Patient Instructions (Addendum)
You had the flu shot today  Please take all new medication as prescribed - the pain medication as needed (and call if you need more)  Please continue all other medications as before, and refills have been done if requested.  Please have the pharmacy call with any other refills you may need.  Please continue your efforts at being more active, low cholesterol diet, and weight control.  You are otherwise up to date with prevention measures today.  Please keep your appointments with your specialists as you may have planned  You will be contacted regarding the referral for: sports medicine on the first floor of this building  Please go to the XRAY Department in the first floor for the x-ray testing  Please go to the LAB at the blood drawing area for the tests to be done  You will be contacted by phone if any changes need to be made immediately.  Otherwise, you will receive a letter about your results with an explanation, but please check with MyChart first.  Please remember to sign up for MyChart if you have not done so, as this will be important to you in the future with finding out test results, communicating by private email, and scheduling acute appointments online when needed.  Please make an Appointment to return in 6 months

## 2020-05-07 ENCOUNTER — Other Ambulatory Visit: Payer: Self-pay | Admitting: Internal Medicine

## 2020-05-07 ENCOUNTER — Encounter: Payer: Self-pay | Admitting: Internal Medicine

## 2020-05-07 LAB — BASIC METABOLIC PANEL
BUN: 13 mg/dL (ref 6–23)
CO2: 27 mEq/L (ref 19–32)
Calcium: 9.1 mg/dL (ref 8.4–10.5)
Chloride: 100 mEq/L (ref 96–112)
Creatinine, Ser: 1.07 mg/dL (ref 0.40–1.50)
GFR: 76 mL/min (ref >60.00)
Glucose, Bld: 104 mg/dL — ABNORMAL HIGH (ref 70–99)
Potassium: 3.5 mEq/L (ref 3.5–5.1)
Sodium: 138 mEq/L (ref 135–145)

## 2020-05-07 MED ORDER — CIPROFLOXACIN HCL 500 MG PO TABS: 500.0000 mg | ORAL_TABLET | Freq: Two times a day (BID) | ORAL | 0 refills | Status: AC

## 2020-05-08 ENCOUNTER — Encounter: Payer: Self-pay | Admitting: Internal Medicine

## 2020-05-10 ENCOUNTER — Ambulatory Visit: Payer: Self-pay

## 2020-05-10 ENCOUNTER — Encounter: Payer: Self-pay | Admitting: Family Medicine

## 2020-05-10 ENCOUNTER — Other Ambulatory Visit: Payer: Self-pay

## 2020-05-10 ENCOUNTER — Ambulatory Visit (INDEPENDENT_AMBULATORY_CARE_PROVIDER_SITE_OTHER): Payer: Medicare Other | Admitting: Family Medicine

## 2020-05-10 VITALS — BP 118/76 | HR 86 | Ht 73.0 in | Wt 282.0 lb

## 2020-05-10 DIAGNOSIS — M25561 Pain in right knee: Secondary | ICD-10-CM

## 2020-05-10 DIAGNOSIS — M25562 Pain in left knee: Secondary | ICD-10-CM

## 2020-05-10 NOTE — Patient Instructions (Signed)
Thank you for coming in today.  Please use voltaren gel up to 4x daily for pain as needed.   If you pain is not controlled enough I recommend a cortisone shot.  Let me know if you hurt badly enough that you want one.   Recheck in 6 weeks.

## 2020-05-10 NOTE — Progress Notes (Signed)
Subjective:   I, Benjamin Hamilton, am serving as a scribe for Dr. [name]. This visit occurred during the SARS-CoV-2 public health emergency.  Safety protocols were in place, including screening questions prior to the visit, additional usage of staff PPE, and extensive cleaning of exam room while observing appropriate contact time as indicated for disinfecting solutions.  CC: B knee pain  HPI: Pt is a 69 y/o male presenting w/ c/o B knee pain.  He locates his pain to anterior knee pain that has come and gone for months. Using topical analgesics and heat for pain. Patient denies locking or catching but does note some crunching sounds.  Pain is worse with activity and sometimes better with rest.  No radiating pain weakness or numbness distally.  Diagnostic testing: B knee XR- 05/06/20  Pertinent review of Systems: Fevers or chills  Relevant historical information: Hypertension   Objective:    Vitals:   05/10/20 1013  BP: 118/76  Pulse: 86  SpO2: 94%   General: Well Developed, well nourished, and in no acute distress.   MSK: Right knee normal-appearing Nontender. Normal motion with crepitation. Stable ligamentous exam. Negative McMurray's test. Intact strength.  Left knee normal-appearing Nontender. Normal motion with crepitation. Stable ligamentous exam. Negative McMurray's test. Intact strength.   Lab and Radiology Results No results found for this or any previous visit (from the past 72 hour(s)). DG Knee Complete 4 Views Left  Result Date: 05/07/2020 CLINICAL DATA:  Left knee pain for 2 months, no known injury, initial encounter EXAM: LEFT KNEE - COMPLETE 4+ VIEW COMPARISON:  None. FINDINGS: Mild degenerative changes are noted in all 3 joint compartments. No joint effusion is seen. No acute fracture or dislocation is noted. No soft tissue abnormality is seen. IMPRESSION: Tricompartmental degenerative change. Electronically Signed   By: Inez Catalina M.D.   On:  05/07/2020 20:29   DG Knee Complete 4 Views Right  Result Date: 05/07/2020 CLINICAL DATA:  Right knee pain, no known injury, initial encounter EXAM: RIGHT KNEE - COMPLETE 4+ VIEW COMPARISON:  None. FINDINGS: No evidence of fracture, dislocation, or joint effusion. No evidence of arthropathy or other focal bone abnormality. Soft tissues are unremarkable. IMPRESSION: Degenerative changes without acute abnormality. Electronically Signed   By: Inez Catalina M.D.   On: 05/07/2020 20:34   I, [name], personally (independently) visualized and performed the interpretation of the images attached in this note. 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  Diagnostic Limited MSK Ultrasound of: Right knee Quad tendon intact normal-appearing Small joint effusion present. Patellar tendon intact normal-appearing Lateral joint line normal-appearing Medial joint line narrowed degenerative with partially extruded medial meniscus. Posterior knee no Baker's cyst. 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 Patellar tendon intact normal-appearing Lateral joint line normal. Medial joint line narrowed degenerative with partially extruded medial meniscus. Posterior knee no Baker's cyst. Impression: DJD with medial meniscus tear with extruded medial meniscus     Impression and Recommendations:    Assessment and Plan: 69 y.o. male with bilateral knee pain intermittent.  Pain predominantly due to DJD changes.  Patient does have evidence of medial meniscus tear but it is unclear of how long this has been present.  It is possible some other causes reduce knee pain including gout but less likely.  It is hard for me to tell how much Benjamin Hamilton is bothered by his pain.  He  is not a great historian saying that sometimes it is mild and sometimes it severe.  When I asked he notes his pain is  not bothersome enough that he would like to proceed with steroid injection.  Plan therefore for conservative management with Voltaren gel and quad strengthening exercises.  Recheck back in 6 weeks.  Return sooner if needed.   Orders Placed This Encounter  Procedures  . [place] LIMITED JOINT SPACE STRUCTURES LOW BILAT(NO LINKED CHARGES)    Order Specific Question:   Reason for Exam (SYMPTOM  OR DIAGNOSIS REQUIRED)    Answer:   bilateral knee pain    Order Specific Question:   Preferred imaging location?    Answer:   [facility]   No orders of the defined types were placed in this encounter.   Discussed warning signs or symptoms. Please see discharge instructions. Patient expresses understanding.   The above documentation has been reviewed and is accurate and complete [name], M.D.

## 2020-05-12 ENCOUNTER — Encounter: Payer: Self-pay | Admitting: Internal Medicine

## 2020-05-12 NOTE — Assessment & Plan Note (Signed)
For xray, refer sport med

## 2020-05-12 NOTE — Assessment & Plan Note (Signed)
stable overall by history and exam, recent data reviewed with pt, and pt to continue medical treatment as before,  to f/u any worsening symptoms or concerns  

## 2020-05-12 NOTE — Assessment & Plan Note (Addendum)
For xray, refer sport med  I spent 31 minutes in preparing to see the patient by review of recent labs, imaging and procedures, obtaining and reviewing separately obtained history, communicating with the patient and family or caregiver, ordering medications, tests or procedures, and documenting clinical information in the EHR including the differential Dx, treatment, and any further evaluation and other management of left and right knee pain, asthma, tn, lhd. Hyperglycemia,

## 2020-06-02 ENCOUNTER — Ambulatory Visit (INDEPENDENT_AMBULATORY_CARE_PROVIDER_SITE_OTHER): Payer: Medicare Other | Admitting: Internal Medicine

## 2020-06-02 ENCOUNTER — Encounter: Payer: Self-pay | Admitting: Internal Medicine

## 2020-06-02 ENCOUNTER — Other Ambulatory Visit: Payer: Self-pay

## 2020-06-02 VITALS — BP 130/80 | HR 91 | Temp 98.4°F | Ht 73.0 in | Wt 281.0 lb

## 2020-06-02 DIAGNOSIS — E559 Vitamin D deficiency, unspecified: Secondary | ICD-10-CM | POA: Diagnosis not present

## 2020-06-02 DIAGNOSIS — R972 Elevated prostate specific antigen [PSA]: Secondary | ICD-10-CM | POA: Diagnosis not present

## 2020-06-02 DIAGNOSIS — N390 Urinary tract infection, site not specified: Secondary | ICD-10-CM

## 2020-06-02 DIAGNOSIS — R739 Hyperglycemia, unspecified: Secondary | ICD-10-CM

## 2020-06-02 LAB — URINALYSIS, ROUTINE W REFLEX MICROSCOPIC
Bilirubin Urine: NEGATIVE
Ketones, ur: NEGATIVE
Leukocytes,Ua: NEGATIVE
Nitrite: NEGATIVE
Specific Gravity, Urine: 1.01 (ref 1.000–1.030)
Total Protein, Urine: NEGATIVE
Urine Glucose: NEGATIVE
Urobilinogen, UA: 0.2 (ref 0.0–1.0)
pH: 6.5 (ref 5.0–8.0)

## 2020-06-02 LAB — PSA: PSA: 344 ng/mL — ABNORMAL HIGH (ref 0.10–4.00)

## 2020-06-02 NOTE — Patient Instructions (Signed)

## 2020-06-02 NOTE — Progress Notes (Signed)
Subjective:    Patient ID: Benjamin Hamilton, male    DOB: Jul 01, 1951, 69 y.o.   MRN: [id number]  HPI  Here to f/u; overall doing ok,  Pt denies chest pain, increasing sob or doe, wheezing, orthopnea, PND, increased LE swelling, palpitations, dizziness or syncope.  Pt denies new neurological symptoms such as new headache, or facial or extremity weakness or numbness.  Pt denies polydipsia, polyuria, or low sugar episode.  Pt states overall good compliance with meds, mostly trying to follow appropriate diet, with wt overall stable,  Denies urinary symptoms such as dysuria, frequency, urgency, flank pain, hematuria or n/v, fever, chills.  tx for recent uti and elevated psa, due for f/u lab.  Tolerating vit d. Past Medical History:  Diagnosis Date  . Asthma   . Chest pain    a. cath 7/11: dLAD 30%, EF 55-60% (done after abnl Myoview with 5 bts polymorphic VT)  . Hypercholesteremia   . Hypertension   . SOB (shortness of breath)    a. PFTs ok; b. Chest CT 11/11 with bronchial wall thickening, no mass or parenchymal disease;  c. seen by Dr. Gwenette Greet - ? environment asthma vs. GERD (PPI no help)  . Wears dentures    top   Past Surgical History:  Procedure Laterality Date  . CARDIAC CATHETERIZATION  2011  . HERNIA REPAIR  1988   right ing   . OPEN REDUCTION INTERNAL FIXATION (ORIF) DISTAL RADIAL FRACTURE Left 06/15/2014   Procedure: OPEN REDUCTION INTERNAL FIXATION (ORIF) LEFT DISTAL RADIAL FRACTURE;  Surgeon: Jolyn Nap, MD;  Location: [facility];  Service: Orthopedics;  Laterality: Left;  ANESTHESIA:  GENERAL/INTERSCALENE BLOCK    reports that he has never smoked. He has never used smokeless tobacco. He reports current alcohol use of about 6.0 standard drinks of alcohol per week. He reports that he does not use drugs. family history includes Asthma in his mother; Diabetes in his mother; Heart attack (age of onset: 32) in his father; Hypertension in his father and mother. No  Known Allergies Current Outpatient Medications on File Prior to Visit  Medication Sig Dispense Refill  . albuterol (VENTOLIN HFA) 108 (90 Base) MCG/ACT inhaler Inhale 2 puffs into the lungs every 4 (four) hours as needed for wheezing or shortness of breath. 18 g 3  . amLODipine (NORVASC) 5 MG tablet Take 1 tablet (5 mg total) by mouth daily. 90 tablet 3  . aspirin EC 81 MG tablet Take 1 tablet (81 mg total) by mouth daily.    . budesonide-formoterol (SYMBICORT) 160-4.5 MCG/ACT inhaler Inhale 2 puffs into the lungs 2 (two) times daily. 10.2 g 3  . ciclopirox (LOPROX) 0.77 % cream APPLY TOPICALLY TO THE AFFECTED AREA TWICE DAILY    . clotrimazole-betamethasone (LOTRISONE) cream Use as directed twice daily as needed to affected area 45 g 1  . hydrochlorothiazide (HYDRODIURIL) 25 MG tablet Take 0.5 tablets (12.5 mg total) by mouth daily. 45 tablet 3  . ketoconazole (NIZORAL) 200 MG tablet Take 1 tablet (200 mg total) by mouth daily. 7 tablet 0  . traMADol (ULTRAM) 50 MG tablet Take 1 tablet (50 mg total) by mouth every 6 (six) hours as needed. 30 tablet 0  . triamcinolone (NASACORT) 55 MCG/ACT AERO nasal inhaler Place 2 sprays into the nose daily. 1 Inhaler 12  . zolpidem (AMBIEN) 5 MG tablet Take 1 tablet (5 mg total) by mouth at bedtime as needed for sleep. 90 tablet 1   No current  facility-administered medications on file prior to visit.   Review of Systems All otherwise neg per pt    Objective:   Physical Exam BP 130/80 (BP Location: Left Arm, Patient Position: Sitting, Cuff Size: Large)   Pulse 91   Temp 98.4 F (36.9 C) (Oral)   Ht 6\' 1"  (1.854 m)   Wt 281 lb (127.5 kg)   SpO2 96%   BMI 37.07 kg/m  VS noted,  Constitutional: Pt appears in NAD HENT: Head: NCAT.  Right Ear: External ear normal.  Left Ear: External ear normal.  Eyes: . Pupils are equal, round, and reactive to light. Conjunctivae and EOM are normal Nose: without d/c or deformity Neck: Neck supple. Gross normal  ROM Cardiovascular: Normal rate and regular rhythm.   Pulmonary/Chest: Effort normal and breath sounds without rales or wheezing.  Abd:  Soft, NT, ND, + BS, no organomegaly Neurological: Pt is alert. At baseline orientation, motor grossly intact Skin: Skin is warm. No rashes, other new lesions, no LE edema Psychiatric: Pt behavior is normal without agitation  All otherwise neg per pt Lab Results  Component Value Date   WBC 5.6 05/06/2020   HGB 12.9 (L) 05/06/2020   HCT 38.9 (L) 05/06/2020   PLT 216.0 05/06/2020   GLUCOSE 104 (H) 05/06/2020   CHOL 194 05/06/2020   TRIG 84.0 05/06/2020   HDL 40.70 05/06/2020   LDLDIRECT 134.1 02/21/2013   LDLCALC 137 (H) 05/06/2020   ALT 11 05/06/2020   AST 14 05/06/2020   NA 138 05/06/2020   K 3.5 05/06/2020   CL 100 05/06/2020   CREATININE 1.07 05/06/2020   BUN 13 05/06/2020   CO2 27 05/06/2020   TSH 1.72 05/06/2020   PSA 344.00 Repeated and verified X2. (H) 06/02/2020   HGBA1C 7.3 (H) 05/06/2020       Assessment & Plan:

## 2020-06-03 LAB — URINE CULTURE: Result:: NO GROWTH

## 2020-06-05 ENCOUNTER — Encounter: Payer: Self-pay | Admitting: Internal Medicine

## 2020-06-05 NOTE — Assessment & Plan Note (Signed)
Cont vit d 2000 qd

## 2020-06-05 NOTE — Assessment & Plan Note (Signed)
With recent very mild elev a1c - for diet, wt control, f/u lab next visit

## 2020-06-05 NOTE — Assessment & Plan Note (Signed)
Asympt,  to f/u any worsening symptoms or concerns 

## 2020-06-05 NOTE — Assessment & Plan Note (Addendum)
For f/u psa, hopefully down, if not will need urology referral  I spent 31 minutes in preparing to see the patient by review of recent labs, imaging and procedures, obtaining and reviewing separately obtained history, communicating with the patient and family or caregiver, ordering medications, tests or procedures, and documenting clinical information in the EHR including the differential Dx, treatment, and any further evaluation and other management of elev psa, vit d def, uti, hyperglycemia

## 2020-06-06 ENCOUNTER — Other Ambulatory Visit: Payer: Self-pay | Admitting: Internal Medicine

## 2020-06-06 ENCOUNTER — Encounter: Payer: Self-pay | Admitting: Internal Medicine

## 2020-06-06 DIAGNOSIS — R972 Elevated prostate specific antigen [PSA]: Secondary | ICD-10-CM

## 2020-06-16 ENCOUNTER — Ambulatory Visit: Payer: Medicare Other

## 2020-06-18 ENCOUNTER — Ambulatory Visit: Payer: Medicare Other

## 2020-06-18 NOTE — Progress Notes (Signed)
   I, Benjamin Hamilton, LAT, ATC acting as a scribe for Benjamin Leader, MD.  Benjamin Hamilton is a 69 y.o. male who presents to [place] at [facility] today for chronic bilateral knee pain. Pt was last seen by Dr. Georgina Hamilton on 05/10/20 and was advised to try quad strengthening, Voltaren gel, and possible future steroid injections. Pt locates pain to be along the anterior aspect of knees and notes some "crunching" sounds. Pain has been going on for month.  Since his last visit w/ Dr. Georgina Hamilton, pt reports improvement in both knees. Only slight pain off and on.   Aggravates: nothing Rx tried: topical analgesics, heat  Dx imaging: 05/10/20 R and L knee XR  Pertinent review of systems: No fevers or chills  Relevant historical information: Hypertension   Exam:  BP 122/78 (BP Location: Right Arm, Patient Position: Sitting, Cuff Size: Normal)   Pulse 89   Ht 6\' 1"  (1.854 m)   Wt 208 lb (94.3 kg)   SpO2 98%   BMI 27.44 kg/m  General: Well Developed, well nourished, and in no acute distress.   MSK: Knees bilaterally normal gait.    Lab and Radiology Results Bilateral knee x-rays obtained May 06, 2020 showed arthritis changes    Assessment and Plan: 69 y.o. male with knee pain bilaterally improved with home exercise program, topical Voltaren gel, and previously prescribed tramadol by PCP. [name] is doing well with conservative management.  Plan to continue conservative management and recheck back with me as needed.  Could consider steroid injection or further interventions if needed.   PDMP not reviewed this encounter. No orders of the defined types were placed in this encounter.  No orders of the defined types were placed in this encounter.    Discussed warning signs or symptoms. Please see discharge instructions. Patient expresses understanding.   The above documentation has been reviewed and is accurate and complete Benjamin Hamilton, M.D.

## 2020-06-21 ENCOUNTER — Other Ambulatory Visit: Payer: Self-pay

## 2020-06-21 ENCOUNTER — Ambulatory Visit (INDEPENDENT_AMBULATORY_CARE_PROVIDER_SITE_OTHER): Payer: Medicare Other | Admitting: Family Medicine

## 2020-06-21 VITALS — BP 122/78 | HR 89 | Ht 73.0 in | Wt 208.0 lb

## 2020-06-21 DIAGNOSIS — M25561 Pain in right knee: Secondary | ICD-10-CM | POA: Diagnosis not present

## 2020-06-21 DIAGNOSIS — M25562 Pain in left knee: Secondary | ICD-10-CM | POA: Diagnosis not present

## 2020-06-21 NOTE — Patient Instructions (Signed)
Thank you for coming in today.  Keep up the exercise.   Please use voltaren gel up to 4x daily for pain as needed.   Recheck with me as needed.

## 2020-07-01 ENCOUNTER — Telehealth: Payer: Self-pay

## 2020-07-01 ENCOUNTER — Other Ambulatory Visit: Payer: Self-pay

## 2020-07-01 ENCOUNTER — Ambulatory Visit (INDEPENDENT_AMBULATORY_CARE_PROVIDER_SITE_OTHER): Payer: Medicare Other

## 2020-07-01 VITALS — BP 120/80 | HR 80 | Temp 98.4°F | Ht 73.0 in | Wt 286.6 lb

## 2020-07-01 DIAGNOSIS — Z0000 Encounter for general adult medical examination without abnormal findings: Secondary | ICD-10-CM

## 2020-07-01 MED ORDER — AMLODIPINE BESYLATE 5 MG PO TABS: 5.0000 mg | ORAL_TABLET | Freq: Every day | ORAL | 3 refills | Status: AC

## 2020-07-01 MED ORDER — HYDROCHLOROTHIAZIDE 25 MG PO TABS
12.5000 mg | ORAL_TABLET | Freq: Every day | ORAL | 3 refills | Status: DC
Start: 2020-07-01 — End: 2020-08-08

## 2020-07-01 MED ORDER — ZOLPIDEM TARTRATE 5 MG PO TABS: 5.0000 mg | ORAL_TABLET | Freq: Every evening | ORAL | 1 refills | Status: DC | PRN

## 2020-07-01 NOTE — Progress Notes (Signed)
Subjective:   Benjamin Hamilton is a 69 y.o. male who presents for Medicare Annual/Subsequent preventive examination.  Review of Systems    No ROS. Medicare Wellness Visit. Additional risk factors are reflected in social history. Cardiac Risk Factors include: advanced age (>47men, >97 women);dyslipidemia;family history of premature cardiovascular disease;hypertension;male gender;obesity (BMI >30kg/m2)     Objective:    Today's Vitals   07/01/20 1444  BP: 120/80  Pulse: 80  Temp: 98.4 F (36.9 C)  SpO2: 96%  Weight: 286 lb 9.6 oz (130 kg)  Height: 6\' 1"  (1.854 m)  PainSc: 0-No pain   Body mass index is 37.81 kg/m.  Advanced Directives 07/01/2020 11/28/2019 03/04/2018 08/27/2017 02/08/2017 05/01/2016 06/15/2014  Does Patient Have a Medical Advance Directive? No No No No No No No  Would patient like information on creating a medical advance directive? No - Patient declined No - Patient declined - No - Patient declined - No - patient declined information -    Current Medications (verified) Outpatient Encounter Medications as of 07/01/2020  Medication Sig   albuterol (VENTOLIN HFA) 108 (90 Base) MCG/ACT inhaler Inhale 2 puffs into the lungs every 4 (four) hours as needed for wheezing or shortness of breath.   amLODipine (NORVASC) 5 MG tablet Take 1 tablet (5 mg total) by mouth daily.   aspirin EC 81 MG tablet Take 1 tablet (81 mg total) by mouth daily.   budesonide-formoterol (SYMBICORT) 160-4.5 MCG/ACT inhaler Inhale 2 puffs into the lungs 2 (two) times daily.   ciclopirox (LOPROX) 0.77 % cream APPLY TOPICALLY TO THE AFFECTED AREA TWICE DAILY   clotrimazole-betamethasone (LOTRISONE) cream Use as directed twice daily as needed to affected area   hydrochlorothiazide (HYDRODIURIL) 25 MG tablet Take 0.5 tablets (12.5 mg total) by mouth daily.   ketoconazole (NIZORAL) 200 MG tablet Take 1 tablet (200 mg total) by mouth daily.   traMADol (ULTRAM) 50 MG tablet Take 1 tablet  (50 mg total) by mouth every 6 (six) hours as needed.   triamcinolone (NASACORT) 55 MCG/ACT AERO nasal inhaler Place 2 sprays into the nose daily.   zolpidem (AMBIEN) 5 MG tablet Take 1 tablet (5 mg total) by mouth at bedtime as needed for sleep.   No facility-administered encounter medications on file as of 07/01/2020.    Allergies (verified) Patient has no known allergies.   History: Past Medical History:  Diagnosis Date   Asthma    Chest pain    a. cath 7/11: dLAD 30%, EF 55-60% (done after abnl Myoview with 5 bts polymorphic VT)   Hypercholesteremia    Hypertension    SOB (shortness of breath)    a. PFTs ok; b. Chest CT 11/11 with bronchial wall thickening, no mass or parenchymal disease;  c. seen by Dr. Gwenette Greet - ? environment asthma vs. GERD (PPI no help)   Wears dentures    top   Past Surgical History:  Procedure Laterality Date   CARDIAC CATHETERIZATION  2011   [place]   right ing    OPEN REDUCTION INTERNAL FIXATION (ORIF) DISTAL RADIAL FRACTURE Left 06/15/2014   Procedure: OPEN REDUCTION INTERNAL FIXATION (ORIF) LEFT DISTAL RADIAL FRACTURE;  Surgeon: Jolyn Nap, MD;  Location: [facility];  Service: Orthopedics;  Laterality: Left;  ANESTHESIA:  GENERAL/INTERSCALENE BLOCK   Family History  Problem Relation Age of Onset   Asthma Mother    Diabetes Mother    Hypertension Mother    Heart attack Father 22  Hypertension Father    Colon cancer Neg Hx    Colon polyps Neg Hx    Esophageal cancer Neg Hx    Rectal cancer Neg Hx    Stomach cancer Neg Hx    Social History   Socioeconomic History   Marital status: Single    Spouse name: Not on file   Number of children: Not on file   Years of education: Not on file   Highest education level: Not on file  Occupational History   Occupation: Retired    Comment: in 2016  Tobacco Use   Smoking status: Never Smoker   Smokeless tobacco: Never Used  Brewing technologist Use: Never used  Substance and Sexual Activity   Alcohol use: Yes    Alcohol/week: 6.0 standard drinks    Types: 6 Cans of beer per week    Comment: on weekends    Drug use: No   Sexual activity: Not on file  Other Topics Concern   Not on file  Social History Narrative   Not on file   Social Determinants of Health   Financial Resource Strain: Low Risk    Difficulty of Paying Living Expenses: Not hard at all  Food Insecurity: No Food Insecurity   Worried About Charity fundraiser in the Last Year: Never true   [place] in the Last Year: Never true  Transportation Needs: No Transportation Needs   Lack of Transportation (Medical): No   Lack of Transportation (Non-Medical): No  Physical Activity: Sufficiently Active   Days of Exercise per Week: 7 days   Minutes of Exercise per Session: 30 min  Stress: No Stress Concern Present   Feeling of Stress : Not at all  Social Connections: Moderately Integrated   Frequency of Communication with Friends and Family: More than three times a week   Frequency of Social Gatherings with Friends and Family: More than three times a week   Attends Religious Services: More than 4 times per year   Active Member of [employer] or Organizations: Yes   Attends Music therapist: More than 4 times per year   Marital Status: Never married    Tobacco Counseling Counseling given: Not Answered   Clinical Intake:  Pre-visit preparation completed: Yes  Pain : No/denies pain Pain Score: 0-No pain     BMI - recorded: 37.81 Nutritional Status: BMI > 30  Obese Nutritional Risks: None Diabetes: No  How often do you need to have someone help you when you read instructions, pamphlets, or other written materials from your doctor or pharmacy?: 1 - Never What is the last grade level you completed in school?: High School Graduate  Diabetic? no  Interpreter Needed?: No  Information entered by :: Lisette Abu, LPN   Activities of Daily Living In your present state of health, do you have any difficulty performing the following activities: 07/01/2020 05/06/2020  Hearing? N N  Vision? N N  Difficulty concentrating or making decisions? N N  Walking or climbing stairs? N N  Dressing or bathing? N N  Doing errands, shopping? N N  Preparing Food and eating ? N -  Using the Toilet? N -  In the past six months, have you accidently leaked urine? N -  Do you have problems with loss of bowel control? N -  Managing your Medications? N -  Managing your Finances? N -  Housekeeping or managing your Housekeeping? N -  Some recent data  might be hidden    Patient Care Team: Biagio Borg, MD as PCP - General (Internal Medicine) [place] Comment, [facility] [place], [place] as Referring Physician (Optometry)  Indicate any recent Medical Services you may have received from other than [facility] providers in the past year (date may be approximate).     Assessment:   This is a routine wellness examination for Shmiel.  Hearing/Vision screen No exam data present  Dietary issues and exercise activities discussed: Current Exercise Habits: Home exercise routine, Type of exercise: walking, Time (Minutes): 30, Frequency (Times/Week): 7, Weekly Exercise (Minutes/Week): 210, Intensity: Moderate, Exercise limited by: respiratory conditions(s);orthopedic condition(s)  Goals   None    Depression Screen PHQ 2/9 Scores 07/01/2020 05/06/2020 05/06/2020 09/19/2019 04/01/2019 09/18/2018 03/19/2018  PHQ - 2 Score 0 0 0 0 0 0 0  PHQ- 9 Score - - - - - - -    Fall Risk Fall Risk  07/01/2020 05/06/2020 05/06/2020 12/05/2019 09/19/2019  Falls in the past year? 0 0 0 0 0  Number falls in past yr: 0 - 0 0 -  Injury with Fall? 0 - - 0 -  Risk for fall due to : No Fall Risks - No Fall Risks No Fall Risks -  Follow up Falls evaluation completed - Falls evaluation completed Falls evaluation completed -    FALL RISK PREVENTION PERTAINING TO THE  HOME:  Any stairs in or around the home? No  If so, are there any without handrails? No  Home free of loose throw rugs in walkways, pet beds, electrical cords, etc? Yes  Adequate lighting in your home to reduce risk of falls? Yes   ASSISTIVE DEVICES UTILIZED TO PREVENT FALLS:  Life alert? No  Use of a cane, walker or w/c? No  Grab bars in the bathroom? No  Shower chair or bench in shower? No  Elevated toilet seat or a handicapped toilet? No   TIMED UP AND GO:  Was the test performed? No .  Length of time to ambulate 10 feet: 0 sec.   Gait steady and fast without use of assistive device  Cognitive Function: Normal cognitive status assessed by direct observation by this Nurse Health Advisor. No abnormalities found.         Immunizations Immunization History  Administered Date(s) Administered   Fluad Quad(high Dose 65+) 04/01/2019, 05/06/2020   PFIZER SARS-COV-2 Vaccination 09/16/2019, 10/07/2019, 06/07/2020   Pneumococcal Conjugate-13 09/18/2018   Tdap 04/01/2019    TDAP status: Up to date  Flu Vaccine status: Up to date  Pneumococcal vaccine status: Up to date  Covid-19 vaccine status: Completed vaccines  Qualifies for Shingles Vaccine? Yes   Zostavax completed No   Shingrix Completed?: No.    Education has been provided regarding the importance of this vaccine. Patient has been advised to call insurance company to determine out of pocket expense if they have not yet received this vaccine. Advised may also receive vaccine at local pharmacy or Health Dept. Verbalized acceptance and understanding.  Screening Tests Health Maintenance  Topic Date Due   Hepatitis C Screening  Never done   PNA vac Low Risk Adult (2 of 2 - PPSV23) 09/18/2020 (Originally 09/18/2019)   COLONOSCOPY  05/09/2023   TETANUS/TDAP  03/31/2029   INFLUENZA VACCINE  Completed   COVID-19 Vaccine  Completed    Health Maintenance  Health Maintenance Due  Topic Date Due   Hepatitis C  Screening  Never done    Colorectal cancer screening: Type of screening: Colonoscopy. Completed  05/08/2018. Repeat every 5 years  Lung Cancer Screening: (Low Dose CT Chest recommended if Age 82-80 years, 30 pack-year currently smoking OR have quit w/in 15years.) does not qualify.   Lung Cancer Screening Referral: no  Additional Screening:  Hepatitis C Screening: does qualify; Completed no  Vision Screening: Recommended annual ophthalmology exams for early detection of glaucoma and other disorders of the eye. Is the patient up to date with their annual eye exam?  Yes  Who is the provider or what is the name of the office in which the patient attends annual eye exams? [facility]. If pt is not established with a provider, would they like to be referred to a provider to establish care? No .   Dental Screening: Recommended annual dental exams for proper oral hygiene  Community Resource Referral / Chronic Care Management: CRR required this visit?  No   CCM required this visit?  No      Plan:     I have personally reviewed and noted the following in the patients chart:    Medical and social history  Use of alcohol, tobacco or illicit drugs   Current medications and supplements  Functional ability and status  Nutritional status  Physical activity  Advanced directives  List of other physicians  Hospitalizations, surgeries, and ER visits in previous 12 months  Vitals  Screenings to include cognitive, depression, and falls  Referrals and appointments  In addition, I have reviewed and discussed with patient certain preventive protocols, quality metrics, and best practice recommendations. A written personalized care plan for preventive services as well as general preventive health recommendations were provided to patient.     Sheral Flow, LPN   [id number]   Nurse Notes: n/a

## 2020-07-01 NOTE — Telephone Encounter (Signed)
Done erx 

## 2020-07-01 NOTE — Patient Instructions (Addendum)
Benjamin Hamilton , Thank you for taking time to come for your Medicare Wellness Visit. I appreciate your ongoing commitment to your health goals. Please review the following plan we discussed and let me know if I can assist you in the future.   Screening recommendations/referrals: Colonoscopy: 05/08/2018; due every 5 years Recommended yearly ophthalmology/optometry visit for glaucoma screening and checkup Recommended yearly dental visit for hygiene and checkup  Vaccinations: Influenza vaccine: 05/06/2020 Pneumococcal vaccine: 09/18/2018 Tdap vaccine: 04/01/2019; due every 10 years Shingles vaccine: never done   Covid-19: up to date  Advanced directives: Advance directive discussed with you today. Even though you declined this today please call our office should you change your mind and we can give you the proper paperwork for you to fill out.  Conditions/risks identified: Yes; Reviewed health maintenance screenings with patient today and relevant education, vaccines, and/or referrals were provided. Please continue to do your personal lifestyle choices by: daily care of teeth and gums, regular physical activity (goal should be 5 days a week for 30 minutes), eat a healthy diet, avoid tobacco and drug use, limiting any alcohol intake, taking a low-dose aspirin (if not allergic or have been advised by your provider otherwise) and taking vitamins and minerals as recommended by your provider. Continue doing brain stimulating activities (puzzles, reading, adult coloring books, staying active) to keep memory sharp. Continue to eat heart healthy diet (full of fruits, vegetables, whole grains, lean protein, water--limit salt, fat, and sugar intake) and increase physical activity as tolerated.  Next appointment: Please schedule your next Medicare Wellness Visit with your Nurse Health Advisor in 1 year by calling [phone].  Preventive Care 67 Years and Older, Male Preventive care refers to lifestyle choices  and visits with your health care provider that can promote health and wellness. What does preventive care include?  A yearly physical exam. This is also called an annual well check.  Dental exams once or twice a year.  Routine eye exams. Ask your health care provider how often you should have your eyes checked.  Personal lifestyle choices, including:  Daily care of your teeth and gums.  Regular physical activity.  Eating a healthy diet.  Avoiding tobacco and drug use.  Limiting alcohol use.  Practicing safe sex.  Taking low doses of aspirin every day.  Taking vitamin and mineral supplements as recommended by your health care provider. What happens during an annual well check? The services and screenings done by your health care provider during your annual well check will depend on your age, overall health, lifestyle risk factors, and family history of disease. Counseling  Your health care provider may ask you questions about your:  Alcohol use.  Tobacco use.  Drug use.  Emotional well-being.  Home and relationship well-being.  Sexual activity.  Eating habits.  History of falls.  Memory and ability to understand (cognition).  Work and work Statistician. Screening  You may have the following tests or measurements:  Height, weight, and BMI.  Blood pressure.  Lipid and cholesterol levels. These may be checked every 5 years, or more frequently if you are over 22 years old.  Skin check.  Lung cancer screening. You may have this screening every year starting at age 67 if you have a 30-pack-year history of smoking and currently smoke or have quit within the past 15 years.  Fecal occult blood test (FOBT) of the stool. You may have this test every year starting at age 67.  Flexible sigmoidoscopy or colonoscopy. You may  have a sigmoidoscopy every 5 years or a colonoscopy every 10 years starting at age 67.  Prostate cancer screening. Recommendations will vary  depending on your family history and other risks.  Hepatitis C blood test.  Hepatitis B blood test.  Sexually transmitted disease (STD) testing.  Diabetes screening. This is done by checking your blood sugar (glucose) after you have not eaten for a while (fasting). You may have this done every 1-3 years.  Abdominal aortic aneurysm (AAA) screening. You may need this if you are a current or former smoker.  Osteoporosis. You may be screened starting at age 67 if you are at high risk. Talk with your health care provider about your test results, treatment options, and if necessary, the need for more tests. Vaccines  Your health care provider may recommend certain vaccines, such as:  Influenza vaccine. This is recommended every year.  Tetanus, diphtheria, and acellular pertussis (Tdap, Td) vaccine. You may need a Td booster every 10 years.  Zoster vaccine. You may need this after age 60.  Pneumococcal 13-valent conjugate (PCV13) vaccine. One dose is recommended after age 67.  Pneumococcal polysaccharide (PPSV23) vaccine. One dose is recommended after age 67. Talk to your health care provider about which screenings and vaccines you need and how often you need them. This information is not intended to replace advice given to you by your health care provider. Make sure you discuss any questions you have with your health care provider. Document Released: 07/30/2015 Document Revised: 03/22/2016 Document Reviewed: 05/04/2015 Elsevier Interactive Patient Education  2017 [place] Prevention in the Home Falls can cause injuries. They can happen to people of all ages. There are many things you can do to make your home safe and to help prevent falls. What can I do on the outside of my home?  Regularly fix the edges of walkways and driveways and fix any cracks.  Remove anything that might make you trip as you walk through a door, such as a raised step or threshold.  Trim any bushes  or trees on the path to your home.  Use bright outdoor lighting.  Clear any walking paths of anything that might make someone trip, such as rocks or tools.  Regularly check to see if handrails are loose or broken. Make sure that both sides of any steps have handrails.  Any raised decks and porches should have guardrails on the edges.  Have any leaves, snow, or ice cleared regularly.  Use sand or salt on walking paths during winter.  Clean up any spills in your garage right away. This includes oil or grease spills. What can I do in the bathroom?  Use night lights.  Install grab bars by the toilet and in the tub and shower. Do not use towel bars as grab bars.  Use non-skid mats or decals in the tub or shower.  If you need to sit down in the shower, use a plastic, non-slip stool.  Keep the floor dry. Clean up any water that spills on the floor as soon as it happens.  Remove soap buildup in the tub or shower regularly.  Attach bath mats securely with double-sided non-slip rug tape.  Do not have throw rugs and other things on the floor that can make you trip. What can I do in the bedroom?  Use night lights.  Make sure that you have a light by your bed that is easy to reach.  Do not use any sheets or blankets  that are too big for your bed. They should not hang down onto the floor.  Have a firm chair that has side arms. You can use this for support while you get dressed.  Do not have throw rugs and other things on the floor that can make you trip. What can I do in the kitchen?  Clean up any spills right away.  Avoid walking on wet floors.  Keep items that you use a lot in easy-to-reach places.  If you need to reach something above you, use a strong step stool that has a grab bar.  Keep electrical cords out of the way.  Do not use floor polish or wax that makes floors slippery. If you must use wax, use non-skid floor wax.  Do not have throw rugs and other things on  the floor that can make you trip. What can I do with my stairs?  Do not leave any items on the stairs.  Make sure that there are handrails on both sides of the stairs and use them. Fix handrails that are broken or loose. Make sure that handrails are as long as the stairways.  Check any carpeting to make sure that it is firmly attached to the stairs. Fix any carpet that is loose or worn.  Avoid having throw rugs at the top or bottom of the stairs. If you do have throw rugs, attach them to the floor with carpet tape.  Make sure that you have a light switch at the top of the stairs and the bottom of the stairs. If you do not have them, ask someone to add them for you. What else can I do to help prevent falls?  Wear shoes that:  Do not have high heels.  Have rubber bottoms.  Are comfortable and fit you well.  Are closed at the toe. Do not wear sandals.  If you use a stepladder:  Make sure that it is fully opened. Do not climb a closed stepladder.  Make sure that both sides of the stepladder are locked into place.  Ask someone to hold it for you, if possible.  Clearly mark and make sure that you can see:  Any grab bars or handrails.  First and last steps.  Where the edge of each step is.  Use tools that help you move around (mobility aids) if they are needed. These include:  Canes.  Walkers.  Scooters.  Crutches.  Turn on the lights when you go into a dark area. Replace any light bulbs as soon as they burn out.  Set up your furniture so you have a clear path. Avoid moving your furniture around.  If any of your floors are uneven, fix them.  If there are any pets around you, be aware of where they are.  Review your medicines with your doctor. Some medicines can make you feel dizzy. This can increase your chance of falling. Ask your doctor what other things that you can do to help prevent falls. This information is not intended to replace advice given to you by  your health care provider. Make sure you discuss any questions you have with your health care provider. Document Released: 04/29/2009 Document Revised: 12/09/2015 Document Reviewed: 08/07/2014 Elsevier Interactive Patient Education  2017 [employer].

## 2020-07-07 ENCOUNTER — Encounter: Payer: Self-pay | Admitting: Internal Medicine

## 2020-07-07 ENCOUNTER — Other Ambulatory Visit: Payer: Self-pay

## 2020-07-07 ENCOUNTER — Ambulatory Visit (INDEPENDENT_AMBULATORY_CARE_PROVIDER_SITE_OTHER): Payer: Medicare Other | Admitting: Internal Medicine

## 2020-07-07 VITALS — BP 140/94 | HR 72 | Temp 97.9°F | Ht 73.0 in | Wt 274.0 lb

## 2020-07-07 DIAGNOSIS — N401 Enlarged prostate with lower urinary tract symptoms: Secondary | ICD-10-CM

## 2020-07-07 DIAGNOSIS — D539 Nutritional anemia, unspecified: Secondary | ICD-10-CM | POA: Diagnosis not present

## 2020-07-07 DIAGNOSIS — N138 Other obstructive and reflux uropathy: Secondary | ICD-10-CM

## 2020-07-07 DIAGNOSIS — C61 Malignant neoplasm of prostate: Secondary | ICD-10-CM | POA: Diagnosis not present

## 2020-07-07 DIAGNOSIS — N410 Acute prostatitis: Secondary | ICD-10-CM

## 2020-07-07 DIAGNOSIS — D508 Other iron deficiency anemias: Secondary | ICD-10-CM

## 2020-07-07 DIAGNOSIS — E118 Type 2 diabetes mellitus with unspecified complications: Secondary | ICD-10-CM

## 2020-07-07 MED ORDER — SULFAMETHOXAZOLE-TRIMETHOPRIM 800-160 MG PO TABS: 1.0000 | ORAL_TABLET | Freq: Two times a day (BID) | ORAL | 0 refills | Status: DC

## 2020-07-07 MED ORDER — ALFUZOSIN HCL ER 10 MG PO TB24: 10.0000 mg | ORAL_TABLET | Freq: Every day | ORAL | 1 refills | Status: AC

## 2020-07-07 NOTE — Progress Notes (Signed)
Subjective:  Patient ID: Benjamin Hamilton, male    DOB: [birth year]  Age: 69 y.o. MRN: [id number]  CC: Anemia, Hypertension, Urinary Tract Infection, and Diabetes  This visit occurred during the SARS-CoV-2 public health emergency.  Safety protocols were in place, including screening questions prior to the visit, additional usage of staff PPE, and extensive cleaning of exam room while observing appropriate contact time as indicated for disinfecting solutions.   NEW TO ME  HPI Benjamin Hamilton presents for f/up-   He complains of a 2-week history of urinary straining and hesitancy.  He denies dysuria, hematuria, pelvic or abdominal pain, fever, chills, nausea, vomiting, bright red blood per rectum, or melena.  He has a history of prostate cancer.  He tells me that the cancer was successfully treated 2 years ago.  His recent PSA was up to 344.  He does not monitor his blood sugar.  He denies polys.  He is not currently taking any glycemic agents.  He reports that his blood pressure has been adequately well controlled.  He denies any recent episodes of headache, blurred vision, chest pain, shortness of breath, palpitations, edema, or fatigue.  Outpatient Medications Prior to Visit  Medication Sig Dispense Refill  . albuterol (VENTOLIN HFA) 108 (90 Base) MCG/ACT inhaler Inhale 2 puffs into the lungs every 4 (four) hours as needed for wheezing or shortness of breath. 18 g 3  . amLODipine (NORVASC) 5 MG tablet Take 1 tablet (5 mg total) by mouth daily. 90 tablet 3  . aspirin EC 81 MG tablet Take 1 tablet (81 mg total) by mouth daily.    . budesonide-formoterol (SYMBICORT) 160-4.5 MCG/ACT inhaler Inhale 2 puffs into the lungs 2 (two) times daily. 10.2 g 3  . ciclopirox (LOPROX) 0.77 % cream APPLY TOPICALLY TO THE AFFECTED AREA TWICE DAILY    . clotrimazole-betamethasone (LOTRISONE) cream Use as directed twice daily as needed to affected area 45 g 1  . hydrochlorothiazide (HYDRODIURIL) 25 MG tablet  Take 0.5 tablets (12.5 mg total) by mouth daily. 45 tablet 3  . ketoconazole (NIZORAL) 200 MG tablet Take 1 tablet (200 mg total) by mouth daily. 7 tablet 0  . traMADol (ULTRAM) 50 MG tablet Take 1 tablet (50 mg total) by mouth every 6 (six) hours as needed. 30 tablet 0  . triamcinolone (NASACORT) 55 MCG/ACT AERO nasal inhaler Place 2 sprays into the nose daily. 1 Inhaler 12  . zolpidem (AMBIEN) 5 MG tablet Take 1 tablet (5 mg total) by mouth at bedtime as needed for sleep. 90 tablet 1   No facility-administered medications prior to visit.    ROS Review of Systems  Constitutional: Negative.  Negative for diaphoresis, fatigue and fever.  HENT: Negative.   Eyes: Negative for visual disturbance.  Respiratory: Negative for cough, chest tightness, shortness of breath and wheezing.   Cardiovascular: Negative for chest pain, palpitations and leg swelling.  Gastrointestinal: Negative for abdominal pain, blood in stool, constipation, diarrhea, nausea and vomiting.  Endocrine: Negative.   Genitourinary: Positive for difficulty urinating. Negative for dysuria, flank pain, frequency, hematuria, penile discharge, scrotal swelling and testicular pain.  Musculoskeletal: Negative.  Negative for arthralgias and myalgias.  Skin: Negative.  Negative for color change and pallor.  Neurological: Negative.  Negative for dizziness, weakness, light-headedness and headaches.  Hematological: Negative for adenopathy. Does not bruise/bleed easily.  Psychiatric/Behavioral: Negative.     Objective:  BP (!) 140/94   Pulse 72   Temp 97.9 F (36.6 C) (Oral)  Ht 6\' 1"  (1.854 m)   Wt 274 lb (124.3 kg)   SpO2 96%   BMI 36.15 kg/m   BP Readings from Last 3 Encounters:  07/07/20 (!) 140/94  07/01/20 120/80  06/21/20 122/78    Wt Readings from Last 3 Encounters:  07/07/20 274 lb (124.3 kg)  07/01/20 286 lb 9.6 oz (130 kg)  06/21/20 208 lb (94.3 kg)    Physical Exam Vitals reviewed.  HENT:     Nose: Nose  normal.     Mouth/Throat:     Mouth: Mucous membranes are moist.  Eyes:     General: No scleral icterus.    Conjunctiva/sclera: Conjunctivae normal.  Cardiovascular:     Rate and Rhythm: Normal rate and regular rhythm.     Heart sounds: No murmur heard.   Pulmonary:     Effort: Pulmonary effort is normal.     Breath sounds: No stridor. No wheezing, rhonchi or rales.  Abdominal:     General: Abdomen is protuberant. Bowel sounds are normal. There is no distension.     Palpations: Abdomen is soft. There is no hepatomegaly, splenomegaly or mass.     Hernia: There is no hernia in the left inguinal area or right inguinal area.  Genitourinary:    Pubic Area: No rash.      Penis: Normal. No discharge, swelling or lesions.      Testes: Normal.     Epididymis:     Right: Normal. Not inflamed or enlarged. No mass or tenderness.     Left: Normal. Not inflamed or enlarged. No mass or tenderness.     Prostate: Enlarged and tender. No nodules present.     Rectum: Normal. Guaiac result negative. No mass, tenderness, anal fissure, external hemorrhoid or internal hemorrhoid. Normal anal tone.     Comments: 3++ enlarged prostate gland, boggy and tender Musculoskeletal:        General: Normal range of motion.     Cervical back: Neck supple.     Right lower leg: No edema.     Left lower leg: No edema.  Lymphadenopathy:     Cervical: No cervical adenopathy.     Lower Body: No right inguinal adenopathy. No left inguinal adenopathy.  Skin:    General: Skin is warm and dry.     Coloration: Skin is not pale.  Neurological:     Mental Status: He is alert.     Lab Results  Component Value Date   WBC 4.6 07/07/2020   HGB 11.7 (L) 07/07/2020   HCT 36.3 (L) 07/07/2020   PLT 243.0 07/07/2020   GLUCOSE 104 (H) 05/06/2020   CHOL 194 05/06/2020   TRIG 84.0 05/06/2020   HDL 40.70 05/06/2020   LDLDIRECT 134.1 02/21/2013   LDLCALC 137 (H) 05/06/2020   ALT 11 05/06/2020   AST 14 05/06/2020   NA  138 05/06/2020   K 3.5 05/06/2020   CL 100 05/06/2020   CREATININE 1.07 05/06/2020   BUN 13 05/06/2020   CO2 27 05/06/2020   TSH 1.72 05/06/2020   PSA 344.00 Repeated and verified X2. (H) 06/02/2020   HGBA1C 7.3 (H) 05/06/2020   MICROALBUR 4.8 (H) 07/07/2020    No results found.  Assessment & Plan:   Benjamin Hamilton was seen today for anemia, hypertension, urinary tract infection and diabetes.  Diagnoses and all orders for this visit:  Acute prostatitis- Based on his symptoms and exam I think he has acute bacterial prostatitis.  I recommended a 30-day course  of SMX/TMP. -     Urinalysis, Routine w reflex microscopic; Future -     CULTURE, URINE COMPREHENSIVE; Future -     sulfamethoxazole-trimethoprim (BACTRIM DS) 800-160 MG tablet; Take 1 tablet by mouth 2 (two) times daily. -     CULTURE, URINE COMPREHENSIVE -     Urinalysis, Routine w reflex microscopic  Deficiency anemia- I will screen him for vitamin deficiencies. -     CBC with Differential/Platelet; Future -     Reticulocytes; Future -     Iron; Future -     Folate; Future -     Ferritin; Future -     Vitamin B1; Future -     Vitamin B1 -     Ferritin -     Iron -     Folate -     Reticulocytes -     CBC with Differential/Platelet  Type II diabetes mellitus with manifestations ([place])- His A1c is at 7.3%.  His blood sugar is adequately well controlled. -     Microalbumin / creatinine urine ratio; Future -     Microalbumin / creatinine urine ratio  Prostate cancer ([place])- I am not certain that he understands that there has been a recurrence of his prostate cancer.  I have asked him to see urology and oncology as soon as possible. -     Ambulatory referral to Oncology  BPH with obstruction/lower urinary tract symptoms- He is symptomatic with this so I recommended that he start taking a peripheral alpha-blocker. -     alfuzosin (UROXATRAL) 10 MG 24 hr tablet; Take 1 tablet (10 mg total) by mouth daily with breakfast.   I  am having Benjamin Hamilton start on sulfamethoxazole-trimethoprim and alfuzosin. I am also having him maintain his aspirin EC, triamcinolone, ketoconazole, ciclopirox, clotrimazole-betamethasone, traMADol, albuterol, budesonide-formoterol, hydrochlorothiazide, amLODipine, and zolpidem.  Meds ordered this encounter  Medications  . sulfamethoxazole-trimethoprim (BACTRIM DS) 800-160 MG tablet    Sig: Take 1 tablet by mouth 2 (two) times daily.    Dispense:  60 tablet    Refill:  0  . alfuzosin (UROXATRAL) 10 MG 24 hr tablet    Sig: Take 1 tablet (10 mg total) by mouth daily with breakfast.    Dispense:  90 tablet    Refill:  1   I spent 50 minutes in preparing to see the patient by review of recent labs, imaging and procedures, obtaining and reviewing separately obtained history, communicating with the patient and family or caregiver, ordering medications, tests or procedures, and documenting clinical information in the EHR including the differential Dx, treatment, and any further evaluation and other management of 1. Acute prostatitis 2. Deficiency anemia 3. Type II diabetes mellitus with manifestations ([place]) 4. Prostate cancer ([place]) 5. BPH with obstruction/lower urinary tract symptoms 6. Iron deficiency anemia due to dietary causes      Follow-up: Return in about 3 weeks (around 07/28/2020).  Scarlette Calico, MD

## 2020-07-07 NOTE — Patient Instructions (Signed)

## 2020-07-08 DIAGNOSIS — D508 Other iron deficiency anemias: Secondary | ICD-10-CM | POA: Insufficient documentation

## 2020-07-08 DIAGNOSIS — N138 Other obstructive and reflux uropathy: Secondary | ICD-10-CM | POA: Insufficient documentation

## 2020-07-08 LAB — CBC WITH DIFFERENTIAL/PLATELET
Basophils Absolute: 0 10*3/uL (ref 0.0–0.1)
Basophils Relative: 0.6 % (ref 0.0–3.0)
Eosinophils Absolute: 0.1 10*3/uL (ref 0.0–0.7)
Eosinophils Relative: 1.3 % (ref 0.0–5.0)
HCT: 36.3 % — ABNORMAL LOW (ref 39.0–52.0)
Hemoglobin: 11.7 g/dL — ABNORMAL LOW (ref 13.0–17.0)
Lymphocytes Relative: 25.4 % (ref 12.0–46.0)
Lymphs Abs: 1.2 10*3/uL (ref 0.7–4.0)
MCHC: 32.2 g/dL (ref 30.0–36.0)
MCV: 83.6 fl (ref 78.0–100.0)
Monocytes Absolute: 0.6 10*3/uL (ref 0.1–1.0)
Monocytes Relative: 14.1 % — ABNORMAL HIGH (ref 3.0–12.0)
Neutro Abs: 2.7 10*3/uL (ref 1.4–7.7)
Neutrophils Relative %: 58.6 % (ref 43.0–77.0)
Platelets: 243 10*3/uL (ref 150.0–400.0)
RBC: 4.35 Mil/uL (ref 4.22–5.81)
RDW: 14.9 % (ref 11.5–15.5)
WBC: 4.6 10*3/uL (ref 4.0–10.5)

## 2020-07-08 LAB — URINALYSIS, ROUTINE W REFLEX MICROSCOPIC
Bilirubin Urine: NEGATIVE
Ketones, ur: NEGATIVE
Leukocytes,Ua: NEGATIVE
Nitrite: NEGATIVE
Specific Gravity, Urine: 1.01 (ref 1.000–1.030)
Total Protein, Urine: NEGATIVE
Urine Glucose: NEGATIVE
Urobilinogen, UA: 0.2 (ref 0.0–1.0)
pH: 5.5 (ref 5.0–8.0)

## 2020-07-08 LAB — MICROALBUMIN / CREATININE URINE RATIO
Creatinine,U: 92.3 mg/dL
Microalb Creat Ratio: 5.2 mg/g (ref 0.0–30.0)
Microalb, Ur: 4.8 mg/dL — ABNORMAL HIGH (ref 0.0–1.9)

## 2020-07-08 LAB — FERRITIN: Ferritin: 147.8 ng/mL (ref 22.0–322.0)

## 2020-07-08 LAB — IRON: Iron: 36 ug/dL — ABNORMAL LOW (ref 42–165)

## 2020-07-08 LAB — FOLATE: Folate: 7 ng/mL (ref >5.9)

## 2020-07-09 LAB — CULTURE, URINE COMPREHENSIVE: RESULT:: NO GROWTH

## 2020-07-09 LAB — RETICULOCYTES
ABS Retic: 47740 cells/uL (ref 25000–9000)
Retic Ct Pct: 1.1 %

## 2020-07-09 NOTE — Assessment & Plan Note (Signed)
I recommended that he receive a series of iron infusions.

## 2020-07-13 LAB — VITAMIN B1: Vitamin B1 (Thiamine): 9 nmol/L (ref 8–30)

## 2020-07-14 ENCOUNTER — Encounter: Payer: Self-pay | Admitting: Internal Medicine

## 2020-07-14 ENCOUNTER — Other Ambulatory Visit: Payer: Self-pay

## 2020-07-14 ENCOUNTER — Ambulatory Visit (INDEPENDENT_AMBULATORY_CARE_PROVIDER_SITE_OTHER): Payer: Medicare Other | Admitting: Internal Medicine

## 2020-07-14 DIAGNOSIS — R600 Localized edema: Secondary | ICD-10-CM | POA: Insufficient documentation

## 2020-07-14 DIAGNOSIS — E559 Vitamin D deficiency, unspecified: Secondary | ICD-10-CM

## 2020-07-14 DIAGNOSIS — R972 Elevated prostate specific antigen [PSA]: Secondary | ICD-10-CM

## 2020-07-14 DIAGNOSIS — C61 Malignant neoplasm of prostate: Secondary | ICD-10-CM

## 2020-07-14 MED ORDER — FUROSEMIDE 40 MG PO TABS: 40.0000 mg | ORAL_TABLET | Freq: Every day | ORAL | 5 refills | Status: DC | PRN

## 2020-07-14 NOTE — Assessment & Plan Note (Signed)
PSA 344 He was given a paper w/info re: [facility] appt for 08/16/20

## 2020-07-14 NOTE — Progress Notes (Signed)
Subjective:  Patient ID: Benjamin Hamilton, male    DOB: 26-Mar-1951  Age: 69 y.o. MRN: [id number]  CC: No chief complaint on file.   HPI Benjamin Hamilton presents for leg swelling x 1 week. C/o wt gain. He is on abx for prostatitis - better per pt F/u elevated PSA of 344  Outpatient Medications Prior to Visit  Medication Sig Dispense Refill   albuterol (VENTOLIN HFA) 108 (90 Base) MCG/ACT inhaler Inhale 2 puffs into the lungs every 4 (four) hours as needed for wheezing or shortness of breath. 18 g 3   alfuzosin (UROXATRAL) 10 MG 24 hr tablet Take 1 tablet (10 mg total) by mouth daily with breakfast. 90 tablet 1   amLODipine (NORVASC) 5 MG tablet Take 1 tablet (5 mg total) by mouth daily. 90 tablet 3   aspirin EC 81 MG tablet Take 1 tablet (81 mg total) by mouth daily.     budesonide-formoterol (SYMBICORT) 160-4.5 MCG/ACT inhaler Inhale 2 puffs into the lungs 2 (two) times daily. 10.2 g 3   ciclopirox (LOPROX) 0.77 % cream APPLY TOPICALLY TO THE AFFECTED AREA TWICE DAILY     clotrimazole-betamethasone (LOTRISONE) cream Use as directed twice daily as needed to affected area 45 g 1   hydrochlorothiazide (HYDRODIURIL) 25 MG tablet Take 0.5 tablets (12.5 mg total) by mouth daily. 45 tablet 3   ketoconazole (NIZORAL) 200 MG tablet Take 1 tablet (200 mg total) by mouth daily. 7 tablet 0   sulfamethoxazole-trimethoprim (BACTRIM DS) 800-160 MG tablet Take 1 tablet by mouth 2 (two) times daily. 60 tablet 0   traMADol (ULTRAM) 50 MG tablet Take 1 tablet (50 mg total) by mouth every 6 (six) hours as needed. 30 tablet 0   triamcinolone (NASACORT) 55 MCG/ACT AERO nasal inhaler Place 2 sprays into the nose daily. 1 Inhaler 12   zolpidem (AMBIEN) 5 MG tablet Take 1 tablet (5 mg total) by mouth at bedtime as needed for sleep. 90 tablet 1   No facility-administered medications prior to visit.    ROS: Review of Systems  Constitutional: Positive for unexpected weight change. Negative for  appetite change, fatigue and fever.  HENT: Negative for congestion, nosebleeds, sneezing, sore throat and trouble swallowing.   Eyes: Negative for itching and visual disturbance.  Respiratory: Negative for cough, shortness of breath and wheezing.   Cardiovascular: Positive for leg swelling. Negative for chest pain and palpitations.  Gastrointestinal: Negative for abdominal distention, blood in stool, diarrhea and nausea.  Genitourinary: Positive for frequency. Negative for hematuria.  Musculoskeletal: Positive for arthralgias and gait problem. Negative for back pain, joint swelling and neck pain.  Skin: Negative for rash.  Neurological: Negative for dizziness, tremors, speech difficulty and weakness.  Psychiatric/Behavioral: Negative for agitation, dysphoric mood and sleep disturbance. The patient is not nervous/anxious.     Objective:  BP 138/76    Pulse 84    Temp 98.2 F (36.8 C) (Oral)    Ht 6\' 1"  (1.854 m)    Wt 295 lb (133.8 kg)    SpO2 98%    BMI 38.92 kg/m   BP Readings from Last 3 Encounters:  07/14/20 138/76  07/07/20 (!) 140/94  07/01/20 120/80    Wt Readings from Last 3 Encounters:  07/14/20 295 lb (133.8 kg)  07/07/20 274 lb (124.3 kg)  07/01/20 286 lb 9.6 oz (130 kg)    Physical Exam Constitutional:      General: He is not in acute distress.    Appearance: He  is well-developed.     Comments: NAD  HENT:     Mouth/Throat:     Mouth: Oropharynx is clear and moist.  Eyes:     Conjunctiva/sclera: Conjunctivae normal.     Pupils: Pupils are equal, round, and reactive to light.  Neck:     Thyroid: No thyromegaly.     Vascular: No JVD.  Cardiovascular:     Rate and Rhythm: Normal rate and regular rhythm.     Pulses: Intact distal pulses.     Heart sounds: Normal heart sounds. No murmur heard. No friction rub. No gallop.   Pulmonary:     Effort: Pulmonary effort is normal. No respiratory distress.     Breath sounds: Normal breath sounds. No wheezing or rales.   Chest:     Chest wall: No tenderness.  Abdominal:     General: Bowel sounds are normal. There is no distension.     Palpations: Abdomen is soft. There is no mass.     Tenderness: There is no abdominal tenderness. There is no guarding or rebound.  Musculoskeletal:        General: Tenderness present. No edema. Normal range of motion.     Cervical back: Normal range of motion.  Lymphadenopathy:     Cervical: No cervical adenopathy.  Skin:    General: Skin is warm and dry.     Findings: No rash.  Neurological:     Mental Status: He is alert and oriented to person, place, and time.     Cranial Nerves: No cranial nerve deficit.     Motor: No abnormal muscle tone.     Coordination: He displays a negative Romberg sign. Coordination normal.     Gait: Gait normal.     Deep Tendon Reflexes: Reflexes are normal and symmetric.  Psychiatric:        Mood and Affect: Mood and affect normal.        Behavior: Behavior normal.        Thought Content: Thought content normal.        Judgment: Judgment normal.   B ankle edema 1-2+  Calves NT  Cane B knees w/pain  Lab Results  Component Value Date   WBC 4.6 07/07/2020   HGB 11.7 (L) 07/07/2020   HCT 36.3 (L) 07/07/2020   PLT 243.0 07/07/2020   GLUCOSE 104 (H) 05/06/2020   CHOL 194 05/06/2020   TRIG 84.0 05/06/2020   HDL 40.70 05/06/2020   LDLDIRECT 134.1 02/21/2013   LDLCALC 137 (H) 05/06/2020   ALT 11 05/06/2020   AST 14 05/06/2020   NA 138 05/06/2020   K 3.5 05/06/2020   CL 100 05/06/2020   CREATININE 1.07 05/06/2020   BUN 13 05/06/2020   CO2 27 05/06/2020   TSH 1.72 05/06/2020   PSA 344.00 Repeated and verified X2. (H) 06/02/2020   HGBA1C 7.3 (H) 05/06/2020   MICROALBUR 4.8 (H) 07/07/2020    No results found.  Assessment & Plan:   There are no diagnoses linked to this encounter.   No orders of the defined types were placed in this encounter.    Follow-up: No follow-ups on file.  Sonda Primes, MD

## 2020-07-14 NOTE — Assessment & Plan Note (Signed)
Take Vit D 

## 2020-07-14 NOTE — Assessment & Plan Note (Signed)
New B of ?etiology NAS diet Furosemide po F/u w/Dr Yetta Barre

## 2020-07-14 NOTE — Assessment & Plan Note (Signed)
PSA 344 He was given a paper w/info re: [facility] appt for 08/16/20 

## 2020-07-16 ENCOUNTER — Other Ambulatory Visit: Payer: Self-pay

## 2020-07-16 ENCOUNTER — Inpatient Hospital Stay (HOSPITAL_COMMUNITY)
Admission: EM | Admit: 2020-07-16 | Discharge: 2020-07-20 | DRG: 683 | Disposition: A | Discharge status: Skilled Nursing Facility | Payer: Medicare Other | Attending: Family Medicine | Admitting: Family Medicine

## 2020-07-16 ENCOUNTER — Encounter (HOSPITAL_COMMUNITY): Payer: Self-pay | Admitting: Emergency Medicine

## 2020-07-16 ENCOUNTER — Encounter (HOSPITAL_COMMUNITY): Payer: Medicare Other

## 2020-07-16 DIAGNOSIS — E119 Type 2 diabetes mellitus without complications: Secondary | ICD-10-CM | POA: Diagnosis present

## 2020-07-16 DIAGNOSIS — R3911 Hesitancy of micturition: Secondary | ICD-10-CM | POA: Diagnosis present

## 2020-07-16 DIAGNOSIS — R609 Edema, unspecified: Secondary | ICD-10-CM | POA: Diagnosis not present

## 2020-07-16 DIAGNOSIS — N179 Acute kidney failure, unspecified: Principal | ICD-10-CM | POA: Diagnosis present

## 2020-07-16 DIAGNOSIS — R600 Localized edema: Secondary | ICD-10-CM | POA: Diagnosis not present

## 2020-07-16 DIAGNOSIS — I452 Bifascicular block: Secondary | ICD-10-CM | POA: Diagnosis present

## 2020-07-16 DIAGNOSIS — J45909 Unspecified asthma, uncomplicated: Secondary | ICD-10-CM | POA: Diagnosis present

## 2020-07-16 DIAGNOSIS — N5089 Other specified disorders of the male genital organs: Secondary | ICD-10-CM | POA: Diagnosis present

## 2020-07-16 DIAGNOSIS — D508 Other iron deficiency anemias: Secondary | ICD-10-CM | POA: Diagnosis present

## 2020-07-16 DIAGNOSIS — E118 Type 2 diabetes mellitus with unspecified complications: Secondary | ICD-10-CM | POA: Diagnosis present

## 2020-07-16 DIAGNOSIS — R339 Retention of urine, unspecified: Secondary | ICD-10-CM

## 2020-07-16 DIAGNOSIS — I10 Essential (primary) hypertension: Secondary | ICD-10-CM

## 2020-07-16 DIAGNOSIS — E7800 Pure hypercholesterolemia, unspecified: Secondary | ICD-10-CM | POA: Diagnosis present

## 2020-07-16 DIAGNOSIS — R338 Other retention of urine: Secondary | ICD-10-CM | POA: Diagnosis present

## 2020-07-16 DIAGNOSIS — Z20822 Contact with and (suspected) exposure to covid-19: Secondary | ICD-10-CM | POA: Diagnosis present

## 2020-07-16 DIAGNOSIS — E785 Hyperlipidemia, unspecified: Secondary | ICD-10-CM

## 2020-07-16 DIAGNOSIS — C61 Malignant neoplasm of prostate: Secondary | ICD-10-CM

## 2020-07-16 DIAGNOSIS — R3916 Straining to void: Secondary | ICD-10-CM | POA: Diagnosis present

## 2020-07-16 DIAGNOSIS — Z8546 Personal history of malignant neoplasm of prostate: Secondary | ICD-10-CM

## 2020-07-16 DIAGNOSIS — Z66 Do not resuscitate: Secondary | ICD-10-CM | POA: Diagnosis present

## 2020-07-16 DIAGNOSIS — Z79899 Other long term (current) drug therapy: Secondary | ICD-10-CM | POA: Diagnosis not present

## 2020-07-16 DIAGNOSIS — R9431 Abnormal electrocardiogram [ECG] [EKG]: Secondary | ICD-10-CM

## 2020-07-16 DIAGNOSIS — Z7951 Long term (current) use of inhaled steroids: Secondary | ICD-10-CM

## 2020-07-16 DIAGNOSIS — N401 Enlarged prostate with lower urinary tract symptoms: Secondary | ICD-10-CM | POA: Diagnosis present

## 2020-07-16 DIAGNOSIS — Z7982 Long term (current) use of aspirin: Secondary | ICD-10-CM

## 2020-07-16 LAB — MAGNESIUM: Magnesium: 1.8 mg/dL (ref 1.7–2.4)

## 2020-07-16 LAB — CBC WITH DIFFERENTIAL/PLATELET
Abs Immature Granulocytes: 0.02 10*3/uL (ref 0.00–0.07)
Basophils Absolute: 0 10*3/uL (ref 0.0–0.1)
Basophils Relative: 0 %
Eosinophils Absolute: 0 10*3/uL (ref 0.0–0.5)
Eosinophils Relative: 0 %
HCT: 30.7 % — ABNORMAL LOW (ref 39.0–52.0)
Hemoglobin: 10.4 g/dL — ABNORMAL LOW (ref 13.0–17.0)
Immature Granulocytes: 0 %
Lymphocytes Relative: 7 %
Lymphs Abs: 0.4 10*3/uL — ABNORMAL LOW (ref 0.7–4.0)
MCH: 26.5 pg (ref 26.0–34.0)
MCHC: 33.9 g/dL (ref 30.0–36.0)
MCV: 78.1 fL — ABNORMAL LOW (ref 80.0–100.0)
Monocytes Absolute: 0.7 10*3/uL (ref 0.1–1.0)
Monocytes Relative: 11 %
Neutro Abs: 5.1 10*3/uL (ref 1.7–7.7)
Neutrophils Relative %: 82 %
Platelets: 265 10*3/uL (ref 150–400)
RBC: 3.93 MIL/uL — ABNORMAL LOW (ref 4.22–5.81)
RDW: 14.3 % (ref 11.5–15.5)
WBC: 6.2 10*3/uL (ref 4.0–10.5)
nRBC: 0 % (ref 0.0–0.2)

## 2020-07-16 LAB — OSMOLALITY: Osmolality: 275 mOsm/kg (ref 275–295)

## 2020-07-16 LAB — URINALYSIS, ROUTINE W REFLEX MICROSCOPIC
Bacteria, UA: NONE SEEN
Bilirubin Urine: NEGATIVE
Glucose, UA: NEGATIVE mg/dL
Ketones, ur: NEGATIVE mg/dL
Leukocytes,Ua: NEGATIVE
Nitrite: NEGATIVE
Protein, ur: NEGATIVE mg/dL
RBC / HPF: >50 RBC/hpf — ABNORMAL HIGH (ref 0–5)
Specific Gravity, Urine: 1.006 (ref 1.005–1.030)
pH: 5 (ref 5.0–8.0)

## 2020-07-16 LAB — TSH: TSH: 1.807 u[IU]/mL (ref 0.350–4.500)

## 2020-07-16 LAB — BASIC METABOLIC PANEL
Anion gap: 12 (ref 5–15)
BUN: 62 mg/dL — ABNORMAL HIGH (ref 8–23)
CO2: 20 mmol/L — ABNORMAL LOW (ref 22–32)
Calcium: 8.6 mg/dL — ABNORMAL LOW (ref 8.9–10.3)
Chloride: 89 mmol/L — ABNORMAL LOW (ref 98–111)
Creatinine, Ser: 8.31 mg/dL — ABNORMAL HIGH (ref 0.61–1.24)
GFR, Estimated: 6 mL/min — ABNORMAL LOW (ref >60)
Glucose, Bld: 121 mg/dL — ABNORMAL HIGH (ref 70–99)
Potassium: 3.9 mmol/L (ref 3.5–5.1)
Sodium: 121 mmol/L — ABNORMAL LOW (ref 135–145)

## 2020-07-16 LAB — TROPONIN I (HIGH SENSITIVITY)
Troponin I (High Sensitivity): 21 ng/L — ABNORMAL HIGH (ref <18)
Troponin I (High Sensitivity): 22 ng/L — ABNORMAL HIGH (ref <18)

## 2020-07-16 LAB — SARS CORONAVIRUS 2 (TAT 6-24 HRS): SARS Coronavirus 2: NEGATIVE

## 2020-07-16 LAB — HIV ANTIBODY (ROUTINE TESTING W REFLEX): HIV Screen 4th Generation wRfx: NONREACTIVE

## 2020-07-16 LAB — SODIUM, URINE, RANDOM: Sodium, Ur: 23 mmol/L

## 2020-07-16 MED ORDER — ALFUZOSIN HCL ER 10 MG PO TB24
10.0000 mg | ORAL_TABLET | Freq: Every day | ORAL | Status: DC
  Administered 2020-07-17 – 2020-07-20 (×4): 10 mg via ORAL
  Filled 2020-07-16 (×5): qty 1

## 2020-07-16 MED ORDER — ASPIRIN EC 81 MG PO TBEC
81.0000 mg | DELAYED_RELEASE_TABLET | Freq: Every day | ORAL | Status: DC
  Administered 2020-07-17 – 2020-07-20 (×4): 81 mg via ORAL
  Filled 2020-07-16 (×4): qty 1

## 2020-07-16 MED ORDER — HYDRALAZINE HCL 20 MG/ML IJ SOLN: 10.0000 mg | Freq: Three times a day (TID) | INTRAMUSCULAR | Status: DC | PRN

## 2020-07-16 MED ORDER — MOMETASONE FURO-FORMOTEROL FUM 200-5 MCG/ACT IN AERO
2.0000 | INHALATION_SPRAY | Freq: Two times a day (BID) | RESPIRATORY_TRACT | Status: DC
  Administered 2020-07-17 – 2020-07-20 (×7): 2 via RESPIRATORY_TRACT
  Filled 2020-07-16: qty 8.8

## 2020-07-16 MED ORDER — HEPARIN SODIUM (PORCINE) 5000 UNIT/ML IJ SOLN
5000.0000 [IU] | Freq: Three times a day (TID) | INTRAMUSCULAR | Status: DC
  Administered 2020-07-16 – 2020-07-20 (×11): 5000 [IU] via SUBCUTANEOUS
  Filled 2020-07-16 (×11): qty 1

## 2020-07-16 MED ORDER — ACETAMINOPHEN 650 MG RE SUPP: 650.0000 mg | Freq: Four times a day (QID) | RECTAL | Status: DC | PRN

## 2020-07-16 MED ORDER — ACETAMINOPHEN 325 MG PO TABS: 650.0000 mg | ORAL_TABLET | Freq: Four times a day (QID) | ORAL | Status: DC | PRN

## 2020-07-16 MED ORDER — POLYETHYLENE GLYCOL 3350 17 G PO PACK
17.0000 g | PACK | Freq: Every day | ORAL | Status: DC | PRN
  Administered 2020-07-20: 17 g via ORAL
  Filled 2020-07-16: qty 1

## 2020-07-16 MED ORDER — AMLODIPINE BESYLATE 5 MG PO TABS
5.0000 mg | ORAL_TABLET | Freq: Every day | ORAL | Status: DC
  Administered 2020-07-17 – 2020-07-20 (×4): 5 mg via ORAL
  Filled 2020-07-16 (×4): qty 1

## 2020-07-16 MED ORDER — MAGNESIUM SULFATE 2 GM/50ML IV SOLN
2.0000 g | Freq: Once | INTRAVENOUS | Status: AC
  Administered 2020-07-16: 2 g via INTRAVENOUS
  Filled 2020-07-16: qty 50

## 2020-07-16 MED ORDER — SODIUM CHLORIDE 0.9 % IV SOLN: 750.0000 mg | Freq: Once | INTRAVENOUS | Status: DC

## 2020-07-16 MED ORDER — SODIUM CHLORIDE 0.9% FLUSH
3.0000 mL | Freq: Two times a day (BID) | INTRAVENOUS | Status: DC
  Administered 2020-07-17 – 2020-07-20 (×5): 3 mL via INTRAVENOUS

## 2020-07-16 MED ORDER — ALBUTEROL SULFATE HFA 108 (90 BASE) MCG/ACT IN AERS: 2.0000 | INHALATION_SPRAY | RESPIRATORY_TRACT | Status: DC | PRN

## 2020-07-16 MED ORDER — SODIUM CHLORIDE 0.9 % IV SOLN
510.0000 mg | Freq: Once | INTRAVENOUS | Status: AC
  Administered 2020-07-16: 510 mg via INTRAVENOUS
  Filled 2020-07-16: qty 510

## 2020-07-16 NOTE — ED Triage Notes (Signed)
Per pt, states he has been having difficulty urinating for 2 days-states pain with urination, increased straining

## 2020-07-16 NOTE — ED Provider Notes (Signed)
[facility] EMERGENCY DEPARTMENT Provider Note  CSN: [id number] Arrival date & time: 07/16/20 [id number]    History Chief Complaint  Patient presents with  . Dysuria    HPI  Benjamin Hamilton is a 69 y.o. male with history of prostate cancer, in remission has recently been having difficulty urinating and leg swelling. He was seen at PCP office on 12/22, had elevated PSA then and enlarged prostate. Started on Bactrim for prostatitis. He was seen again on 12/29 for leg swelling. Had a variety of labs drawn for evaluation including anemia panel vitamin levels, but did not have a chemistry panel done during that time. UA was positive for hematuria but no growth on culture.    Past Medical History:  Diagnosis Date  . Asthma   . Chest pain    a. cath 7/11: dLAD 30%, EF 55-60% (done after abnl Myoview with 5 bts polymorphic VT)  . Hypercholesteremia   . Hypertension   . SOB (shortness of breath)    a. PFTs ok; b. Chest CT 11/11 with bronchial wall thickening, no mass or parenchymal disease;  c. seen by Dr. Gwenette Greet - ? environment asthma vs. GERD (PPI no help)  . Wears dentures    top    Past Surgical History:  Procedure Laterality Date  . CARDIAC CATHETERIZATION  2011  . HERNIA REPAIR  1988   right ing   . OPEN REDUCTION INTERNAL FIXATION (ORIF) DISTAL RADIAL FRACTURE Left 06/15/2014   Procedure: OPEN REDUCTION INTERNAL FIXATION (ORIF) LEFT DISTAL RADIAL FRACTURE;  Surgeon: Jolyn Nap, MD;  Location: [employer];  Service: Orthopedics;  Laterality: Left;  ANESTHESIA:  GENERAL/INTERSCALENE BLOCK    Family History  Problem Relation Age of Onset  . Asthma Mother   . Diabetes Mother   . Hypertension Mother   . Heart attack Father 32  . Hypertension Father   . Colon cancer Neg Hx   . Colon polyps Neg Hx   . Esophageal cancer Neg Hx   . Rectal cancer Neg Hx   . Stomach cancer Neg Hx     Social History   Tobacco Use  . Smoking status: Never Smoker  .  Smokeless tobacco: Never Used  Vaping Use  . Vaping Use: Never used  Substance Use Topics  . Alcohol use: Yes    Alcohol/week: 6.0 standard drinks    Types: 6 Cans of beer per week    Comment: on weekends   . Drug use: No     Home Medications Prior to Admission medications   Medication Sig Start Date End Date Taking? Authorizing Provider  albuterol (VENTOLIN HFA) 108 (90 Base) MCG/ACT inhaler Inhale 2 puffs into the lungs every 4 (four) hours as needed for wheezing or shortness of breath. 05/06/20   Biagio Borg, MD  alfuzosin (UROXATRAL) 10 MG 24 hr tablet Take 1 tablet (10 mg total) by mouth daily with breakfast. 07/07/20   Janith Lima, MD  amLODipine (NORVASC) 5 MG tablet Take 1 tablet (5 mg total) by mouth daily. 07/01/20 07/01/21  Biagio Borg, MD  aspirin EC 81 MG tablet Take 1 tablet (81 mg total) by mouth daily. 02/14/13   Richardson Dopp T, PA-C  budesonide-formoterol (SYMBICORT) 160-4.5 MCG/ACT inhaler Inhale 2 puffs into the lungs 2 (two) times daily. 05/06/20   Biagio Borg, MD  ciclopirox (LOPROX) 0.77 % cream APPLY TOPICALLY TO THE AFFECTED AREA TWICE DAILY 10/08/19   [provider]  clotrimazole-betamethasone (LOTRISONE) cream  Use as directed twice daily as needed to affected area 12/05/19   Corwin Levins, MD  furosemide (LASIX) 40 MG tablet Take 1 tablet (40 mg total) by mouth daily as needed. 07/14/20 07/14/21  Plotnikov, Georgina Quint, MD  hydrochlorothiazide (HYDRODIURIL) 25 MG tablet Take 0.5 tablets (12.5 mg total) by mouth daily. 07/01/20   Corwin Levins, MD  ketoconazole (NIZORAL) 200 MG tablet Take 1 tablet (200 mg total) by mouth daily. 08/12/19   Corwin Levins, MD  sulfamethoxazole-trimethoprim (BACTRIM DS) 800-160 MG tablet Take 1 tablet by mouth 2 (two) times daily. 07/07/20 08/07/20  Etta Grandchild, MD  traMADol (ULTRAM) 50 MG tablet Take 1 tablet (50 mg total) by mouth every 6 (six) hours as needed. 05/06/20   Corwin Levins, MD  triamcinolone (NASACORT)  55 MCG/ACT AERO nasal inhaler Place 2 sprays into the nose daily. 09/18/18   Corwin Levins, MD  zolpidem (AMBIEN) 5 MG tablet Take 1 tablet (5 mg total) by mouth at bedtime as needed for sleep. 07/01/20   Corwin Levins, MD     Allergies    Patient has no known allergies.   Review of Systems   Review of Systems A comprehensive review of systems was completed and negative except as noted in HPI.    Physical Exam BP 109/87 (BP Location: Left Arm)   Pulse 92   Temp 98.1 F (36.7 C) (Oral)   Resp 15   SpO2 99%   Physical Exam Vitals and nursing note reviewed.  Constitutional:      Appearance: Normal appearance.  HENT:     Head: Normocephalic and atraumatic.     Nose: Nose normal.     Mouth/Throat:     Mouth: Mucous membranes are moist.  Eyes:     Extraocular Movements: Extraocular movements intact.     Conjunctiva/sclera: Conjunctivae normal.  Cardiovascular:     Rate and Rhythm: Normal rate.  Pulmonary:     Effort: Pulmonary effort is normal.     Breath sounds: Normal breath sounds.  Abdominal:     General: Abdomen is flat.     Palpations: Abdomen is soft.     Tenderness: There is no abdominal tenderness.  Genitourinary:    Comments: Scrotal edema Musculoskeletal:        General: Normal range of motion.     Cervical back: Neck supple.     Right lower leg: Edema present.     Left lower leg: Edema present.  Skin:    General: Skin is warm and dry.  Neurological:     General: No focal deficit present.     Mental Status: He is alert.  Psychiatric:        Mood and Affect: Mood normal.      ED Results / Procedures / Treatments   Labs (all labs ordered are listed, but only abnormal results are displayed) Labs Reviewed  URINALYSIS, ROUTINE W REFLEX MICROSCOPIC - Abnormal; Notable for the following components:      Result Value   APPearance HAZY (*)    Hgb urine dipstick LARGE (*)    RBC / HPF >50 (*)    All other components within normal limits  BASIC METABOLIC  PANEL - Abnormal; Notable for the following components:   Sodium 121 (*)    Chloride 89 (*)    CO2 20 (*)    Glucose, Bld 121 (*)    BUN 62 (*)    Creatinine, Ser 8.31 (*)  Calcium 8.6 (*)    GFR, Estimated 6 (*)    All other components within normal limits  SARS CORONAVIRUS 2 (TAT 6-24 HRS)  CBC WITH DIFFERENTIAL/PLATELET    EKG None  Radiology No results found.  Procedures Procedures  Medications Ordered in the ED Medications - No data to display   MDM Rules/Calculators/A&P MDM Patient had foley placed in triage prior to my evaluation with reportedly 3000cc of urine output. His CMP shows AKI with creatinine now above 8, previously normal. Suspect post-renal etiology due to obstruction. Add CBC today to see if there is a significant change from previous. Plan admission for further eval.  ED Course  I have reviewed the triage vital signs and the nursing notes.  Pertinent labs & imaging results that were available during my care of the patient were reviewed by me and considered in my medical decision making (see chart for details).  Clinical Course as of 07/16/20 1413  Fri Jul 16, 2020  1412 Spoke with Dr. Neysa Bonito, Hospitalist, who will evaluated.  [CS]    Clinical Course User Index [CS] Truddie Hidden, MD    Final Clinical Impression(s) / ED Diagnoses Final diagnoses:  AKI (acute kidney injury) [facility])  Urinary retention    Rx / DC Orders ED Discharge Orders    None       Truddie Hidden, MD 07/16/20 1413

## 2020-07-16 NOTE — H&P (Signed)
History and Physical        Hospital Admission Note Date: 07/16/2020  Patient name: Benjamin Hamilton Medical record number: [id number] Date of birth: 07/01/1951 Age: 69 y.o. Gender: male  PCP: Biagio Borg, MD    Chief Complaint    Chief Complaint  Patient presents with  . Dysuria      HPI:   This is a 69 year old male with past medical history of hypertension, hyperlipidemia, prostate cancer in remission who presented to the ED after having difficulty urinating for the past several days.  He was recently seen by his PCP on 12/22 when had an elevated PSA and enlarged prostate.  He was started on Bactrim for prostatitis and was seen again on 12/29 for his leg swelling.  At that time he was started on Lasix p.o. and had labs drawn for anemia and vitamin levels but did not have a chemistry panel done at the time.  UA was positive for hematuria.  Since then he has had continued difficulty urinating for the past 3 days with no urinary output.  Denies any back pain or abdominal pain.  He is also complaining of persistent leg swelling for a similar period of time which is new and not improved with the Lasix.  He denies any chest pain or palpitations or any other symptoms.   ED Course: Afebrile, hemodynamically stable, on room air. Notable Labs: Sodium 121, chloride 89, CO2 20, glucose 121, BUN 62, creatinine 8.31, calcium 8.6, magnesium 1.8, WBC 6.2, Hb 10.4, platelets 265, UA-hazy, large Hb.  COVID-19 pending.  A Foley catheter was placed with 3 L output.   Vitals:   07/16/20 1348 07/16/20 1735  BP: 109/87 125/63  Pulse: 92 91  Resp: 15 17  Temp: 98.1 F (36.7 C) 98.3 F (36.8 C)  SpO2: 99% 98%     Review of Systems:  Review of Systems  All other systems reviewed and are negative.   Medical/Social/Family History   Past Medical History: Past Medical History:  Diagnosis  Date  . Asthma   . Chest pain    a. cath 7/11: dLAD 30%, EF 55-60% (done after abnl Myoview with 5 bts polymorphic VT)  . Hypercholesteremia   . Hypertension   . SOB (shortness of breath)    a. PFTs ok; b. Chest CT 11/11 with bronchial wall thickening, no mass or parenchymal disease;  c. seen by Dr. Gwenette Greet - ? environment asthma vs. GERD (PPI no help)  . Wears dentures    top    Past Surgical History:  Procedure Laterality Date  . CARDIAC CATHETERIZATION  2011  . HERNIA REPAIR  1988   right ing   . OPEN REDUCTION INTERNAL FIXATION (ORIF) DISTAL RADIAL FRACTURE Left 06/15/2014   Procedure: OPEN REDUCTION INTERNAL FIXATION (ORIF) LEFT DISTAL RADIAL FRACTURE;  Surgeon: Jolyn Nap, MD;  Location: [facility];  Service: Orthopedics;  Laterality: Left;  ANESTHESIA:  GENERAL/INTERSCALENE BLOCK    Medications: Prior to Admission medications   Medication Sig Start Date End Date Taking? Authorizing Provider  albuterol (VENTOLIN HFA) 108 (90 Base) MCG/ACT inhaler Inhale 2 puffs into the lungs every 4 (four) hours as needed for wheezing or shortness of  breath. 05/06/20   Corwin Levins, MD  alfuzosin (UROXATRAL) 10 MG 24 hr tablet Take 1 tablet (10 mg total) by mouth daily with breakfast. 07/07/20   Etta Grandchild, MD  amLODipine (NORVASC) 5 MG tablet Take 1 tablet (5 mg total) by mouth daily. 07/01/20 07/01/21  Corwin Levins, MD  aspirin EC 81 MG tablet Take 1 tablet (81 mg total) by mouth daily. 02/14/13   Tereso Newcomer T, PA-C  budesonide-formoterol (SYMBICORT) 160-4.5 MCG/ACT inhaler Inhale 2 puffs into the lungs 2 (two) times daily. 05/06/20   Corwin Levins, MD  ciclopirox (LOPROX) 0.77 % cream APPLY TOPICALLY TO THE AFFECTED AREA TWICE DAILY 10/08/19   [provider]  clotrimazole-betamethasone (LOTRISONE) cream Use as directed twice daily as needed to affected area 12/05/19   Corwin Levins, MD  furosemide (LASIX) 40 MG tablet Take 1 tablet (40 mg total) by mouth  daily as needed. 07/14/20 07/14/21  Plotnikov, Georgina Quint, MD  hydrochlorothiazide (HYDRODIURIL) 25 MG tablet Take 0.5 tablets (12.5 mg total) by mouth daily. 07/01/20   Corwin Levins, MD  ketoconazole (NIZORAL) 200 MG tablet Take 1 tablet (200 mg total) by mouth daily. 08/12/19   Corwin Levins, MD  sulfamethoxazole-trimethoprim (BACTRIM DS) 800-160 MG tablet Take 1 tablet by mouth 2 (two) times daily. 07/07/20 08/07/20  Etta Grandchild, MD  traMADol (ULTRAM) 50 MG tablet Take 1 tablet (50 mg total) by mouth every 6 (six) hours as needed. 05/06/20   Corwin Levins, MD  triamcinolone (NASACORT) 55 MCG/ACT AERO nasal inhaler Place 2 sprays into the nose daily. 09/18/18   Corwin Levins, MD  zolpidem (AMBIEN) 5 MG tablet Take 1 tablet (5 mg total) by mouth at bedtime as needed for sleep. 07/01/20   Corwin Levins, MD    Allergies:  No Known Allergies  Social History:  reports that he has never smoked. He has never used smokeless tobacco. He reports current alcohol use of about 6.0 standard drinks of alcohol per week. He reports that he does not use drugs.  Family History: Family History  Problem Relation Age of Onset  . Asthma Mother   . Diabetes Mother   . Hypertension Mother   . Heart attack Father 66  . Hypertension Father   . Colon cancer Neg Hx   . Colon polyps Neg Hx   . Esophageal cancer Neg Hx   . Rectal cancer Neg Hx   . Stomach cancer Neg Hx      Objective   Physical Exam: Blood pressure 125/63, pulse 91, temperature 98.3 F (36.8 C), temperature source Oral, resp. rate 17, height 6\' 1"  (1.854 m), weight 129 kg, SpO2 98 %.  Physical Exam Vitals and nursing note reviewed.  Constitutional:      Appearance: Normal appearance.  HENT:     Head: Normocephalic and atraumatic.  Eyes:     Conjunctiva/sclera: Conjunctivae normal.  Cardiovascular:     Rate and Rhythm: Normal rate.     Heart sounds: No murmur heard.     Comments: Abnormal telemetry with persistent PVCs Pulmonary:      Effort: Pulmonary effort is normal.     Breath sounds: Normal breath sounds.  Abdominal:     General: Abdomen is flat.     Palpations: Abdomen is soft.  Genitourinary:    Comments: Foley catheter in place with bag nearly full of dark urine Musculoskeletal:     Right lower leg: Edema present.  Left lower leg: Edema present.     Comments: 3+ bilateral lower extremity edema  Skin:    Coloration: Skin is not jaundiced or pale.  Neurological:     Mental Status: He is alert. Mental status is at baseline.  Psychiatric:        Mood and Affect: Mood normal.        Behavior: Behavior normal.     LABS on Admission: I have personally reviewed all the labs and imaging below    Basic Metabolic Panel: Recent Labs  Lab 07/16/20 1037 07/16/20 1348  NA 121*  --   K 3.9  --   CL 89*  --   CO2 20*  --   GLUCOSE 121*  --   BUN 62*  --   CREATININE 8.31*  --   CALCIUM 8.6*  --   MG  --  1.8   Liver Function Tests: No results for input(s): AST, ALT, ALKPHOS, BILITOT, PROT, ALBUMIN in the last 168 hours. No results for input(s): LIPASE, AMYLASE in the last 168 hours. No results for input(s): AMMONIA in the last 168 hours. CBC: Recent Labs  Lab 07/16/20 1348  WBC 6.2  NEUTROABS 5.1  HGB 10.4*  HCT 30.7*  MCV 78.1*  PLT 265   Cardiac Enzymes: No results for input(s): CKTOTAL, CKMB, CKMBINDEX, TROPONINI in the last 168 hours. BNP: Invalid input(s): POCBNP CBG: No results for input(s): GLUCAP in the last 168 hours.  Radiological Exams on Admission:  No results found.    EKG: LBBB, RBBB   A & P   Active Problems:   Essential hypertension   HLD (hyperlipidemia)   Type II diabetes mellitus with manifestations (HCC)   Prostate cancer (HCC)   Iron deficiency anemia due to dietary causes   Leg edema   AKI (acute kidney injury) (HCC)   Abnormal EKG   1. Postobstructive AKI with history of prostate cancer a. Creatinine 8.3, baseline 1.0 b. Foley catheter placed  with 3 L output c. Hold Lasix, HCTZ, Ambien, Bactrim d. Continue alfuzosin e. Close outpatient follow-up, patient has a urology appointment on January 6  2. Asymptomatic abnormal telemetry a. Frequent PVCs noted on telemetry at bedside.  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 Echo c. Replete magnesium d. Consider cardiology consult pending echo results  3. Iron deficiency anemia a. Iron infusions were ordered outpatient x2 weeks by patient's PCP recently but the patient has not completed them yet b. will change ferric carboxymaltose which was ordered outpatient to [facility] while inpatient per pharmacy recommendations due to lack of reimbursement and to ensure he gets his iron infusion  4. Lower extremity edema a. Concern for heart failure given his abnormal EKG  b. Echo c. Hold Lasix due to AKI  5. Hypertension a. Continue amlodipine b. Continue alfuzosin c. Hold HCTZ  6. Type 2 diabetes, controlled a. Monitor with diet   DVT prophylaxis: Heparin   Code Status: DNR  Diet: Renal Family Communication: Admission, patients condition and plan of care including tests being ordered have been discussed with the patient who indicates understanding and agrees with the plan and Code Status. Disposition Plan: The appropriate patient status for this patient is INPATIENT. Inpatient status is judged to be reasonable and necessary in order to provide the required intensity of service to ensure the patient's safety. The patient's presenting symptoms, physical exam findings, and initial radiographic and laboratory data in the context of their chronic comorbidities is felt to place  them at high risk for further clinical deterioration. Furthermore, it is not anticipated that the patient will be medically stable for discharge from the hospital within 2 midnights of admission. The following factors support the patient status of inpatient.   " The patient's presenting symptoms include urinary  retention " The worrisome physical exam findings include lower extremity edema, abnormal telemetry. " The initial radiographic and laboratory data are worrisome because of AKI. " The chronic co-morbidities include prostate cancer.   * I certify that at the point of admission it is my clinical judgment that the patient will require inpatient hospital care spanning beyond 2 midnights from the point of admission due to high intensity of service, high risk for further deterioration and high frequency of surveillance required.*   Status is: Inpatient  Remains inpatient appropriate because:IV treatments appropriate due to intensity of illness or inability to take PO and Inpatient level of care appropriate due to severity of illness   Dispo: The patient is from: Home              Anticipated d/c is to: Home              Anticipated d/c date is: > 3 days              Patient currently is not medically stable to d/c.       Consultants  . None  Procedures  . Foley catheter  Time Spent on Admission: 68 minutes    Harold Hedge, DO [facility]  07/16/2020, 6:23 PM

## 2020-07-16 NOTE — ED Triage Notes (Signed)
Patient has swelling to his scrotum and bilateral lower extremities.

## 2020-07-16 NOTE — ED Notes (Signed)
Report to floor

## 2020-07-17 ENCOUNTER — Inpatient Hospital Stay (HOSPITAL_COMMUNITY): Payer: Medicare Other

## 2020-07-17 DIAGNOSIS — N179 Acute kidney failure, unspecified: Principal | ICD-10-CM

## 2020-07-17 DIAGNOSIS — R9431 Abnormal electrocardiogram [ECG] [EKG]: Secondary | ICD-10-CM | POA: Diagnosis not present

## 2020-07-17 LAB — BASIC METABOLIC PANEL
Anion gap: 11 (ref 5–15)
BUN: 50 mg/dL — ABNORMAL HIGH (ref 8–23)
CO2: 23 mmol/L (ref 22–32)
Calcium: 8.8 mg/dL — ABNORMAL LOW (ref 8.9–10.3)
Chloride: 97 mmol/L — ABNORMAL LOW (ref 98–111)
Creatinine, Ser: 4.15 mg/dL — ABNORMAL HIGH (ref 0.61–1.24)
GFR, Estimated: 15 mL/min — ABNORMAL LOW (ref >60)
Glucose, Bld: 113 mg/dL — ABNORMAL HIGH (ref 70–99)
Potassium: 3.5 mmol/L (ref 3.5–5.1)
Sodium: 131 mmol/L — ABNORMAL LOW (ref 135–145)

## 2020-07-17 LAB — CBC
HCT: 29.9 % — ABNORMAL LOW (ref 39.0–52.0)
Hemoglobin: 10.2 g/dL — ABNORMAL LOW (ref 13.0–17.0)
MCH: 26.8 pg (ref 26.0–34.0)
MCHC: 34.1 g/dL (ref 30.0–36.0)
MCV: 78.5 fL — ABNORMAL LOW (ref 80.0–100.0)
Platelets: 243 10*3/uL (ref 150–400)
RBC: 3.81 MIL/uL — ABNORMAL LOW (ref 4.22–5.81)
RDW: 14.3 % (ref 11.5–15.5)
WBC: 5.5 10*3/uL (ref 4.0–10.5)
nRBC: 0 % (ref 0.0–0.2)

## 2020-07-17 LAB — ECHOCARDIOGRAM COMPLETE
Area-P 1/2: 2.66 cm2
Height: 73 in
S' Lateral: 3.3 cm
Weight: 4550.29 oz

## 2020-07-17 LAB — OSMOLALITY, URINE: Osmolality, Ur: 296 mOsm/kg — ABNORMAL LOW (ref 300–900)

## 2020-07-17 MED ORDER — CHLORHEXIDINE GLUCONATE CLOTH 2 % EX PADS
6.0000 | MEDICATED_PAD | Freq: Every day | CUTANEOUS | Status: DC
  Administered 2020-07-17 – 2020-07-20 (×3): 6 via TOPICAL

## 2020-07-17 MED ORDER — SODIUM CHLORIDE 0.9 % IV SOLN: INTRAVENOUS | Status: DC

## 2020-07-17 NOTE — Progress Notes (Signed)
  Echocardiogram 2D Echocardiogram has been performed.  Dorena Dew Doxie Augenstein 07/17/2020, 8:35 AM

## 2020-07-17 NOTE — Progress Notes (Signed)
PROGRESS NOTE   Benjamin Hamilton  [id number] DOB: Aug 24, 1950 DOA: 07/16/2020 PCP: Corwin Levins, MD  Brief Narrative:   70 year old black male HTN HLD asthma nonobstructive cardiac disease based on cath 2011 Prior treatment for prostate cancer 2 years prior 2020 PSA elevation 06/02/2020 without dysuria urgency flank pain-developed 12/22 2-week history urinary straining and hesitancy-given 30-day course of Bactrim urine analysis performed showing negative nitrites leukocytes and started on Lasix in addition for lower extremity edema--Urine culture not performed-return to PCP office 12/29-at that time seemed better had [facility] appointment 08/16/2020  Return to ED 12/31 with difficulty passing urine no output persistent leg edema which is new BUNs/creatinine 62/8.3 WBC 6.2 Foley catheter placed with 3 L urine output  Assessment & Plan:   Active Problems:   Essential hypertension   HLD (hyperlipidemia)   Type II diabetes mellitus with manifestations (HCC)   Prostate cancer (HCC)   Iron deficiency anemia due to dietary causes   Leg edema   AKI (acute kidney injury) (HCC)   Abnormal EKG   1. Postobstructive AKI secondary to prostate cancer, use of diuretics/Bactrim a. Significantly improved creatinine with catheter b. Discontinue HCTZ, Lasix, Bactrim from prior to admission c. Hold further magnesium replacement recheck in a.m. d. Start saline 50 cc/h e. Continue alfuzosin f. Liberalize diet to allow heart healthy 2. Asymptomatic PVCs with new RBBB LAFB 3. Moderately dilated aortic root/EF this admission 60-65% without wall motion abnormality a. Hold CT/MRI until outpatient given AKI b. Continuing amlodipine 5 daily c. As needed for blood pressure 4. Lower extremity edema a. Lasix on hold as above 5. DM TY 2 diet controlled a. CBGs ranging 1 13-1 21 6. Iron deficiency anemia a. Hemoglobin stable 10.2 range b. This admission patient received Feraheme  DVT  prophylaxis: Heparin Code Status: DNR confirmed Family Communication: None present Disposition:  Status is: Inpatient  Remains inpatient appropriate because:Persistent severe electrolyte disturbances, Ongoing diagnostic testing needed not appropriate for outpatient work up and IV treatments appropriate due to intensity of illness or inability to take PO   Dispo: The patient is from: Home              Anticipated d/c is to: Home              Anticipated d/c date is: 3 days              Patient currently is not medically stable to d/c.   Consultants:   None currently  Procedures:   Antimicrobials: None   Subjective: Feels fair coherent no distress Some lower extremity swelling no chest pain no cough no cold no diarrhea  Objective: Vitals:   07/16/20 2145 07/17/20 0146 07/17/20 0551 07/17/20 0742  BP: 111/63 118/70 117/70   Pulse: 90 88 86   Resp: 19 20 20    Temp: 98.7 F (37.1 C) 99.7 F (37.6 C) 99.3 F (37.4 C)   TempSrc: Oral Oral Oral   SpO2: 97% 93% 93% 95%  Weight:      Height:        Intake/Output Summary (Last 24 hours) at 07/17/2020 0826 Last data filed at 07/17/2020 0648 Gross per 24 hour  Intake 360 ml  Output 8425 ml  Net -8065 ml   Filed Weights   07/16/20 1715  Weight: 129 kg    Examination: Awake coherent pleasant no distress very pleasant EOMI NCAT no focal deficit Abdomen obese nontender nondistended no rebound ROM intact Psych euthymic Power 5/5   Data  Reviewed: I have personally reviewed following labs and imaging studies Sodium 121-->131 BUN/creatinine 62/8.3-->50/4.1 Bicarb 20-->23 Hemoglobin 10.2 Platelet 243  Radiology Studies: No results found.   Scheduled Meds: . alfuzosin  10 mg Oral Q breakfast  . amLODipine  5 mg Oral Daily  . aspirin EC  81 mg Oral Daily  . Chlorhexidine Gluconate Cloth  6 each Topical Daily  . heparin  5,000 Units Subcutaneous Q8H  . mometasone-formoterol  2 puff Inhalation BID  . sodium  chloride flush  3 mL Intravenous Q12H   Continuous Infusions:   LOS: 1 day    Time spent: 40  Rhetta Mura, MD [facility] To contact the attending provider between 7A-7P or the covering provider during after hours 7P-7A, please log into the web site [email or web address] and access using universal [place] password for that web site. If you do not have the password, please call the hospital operator.  07/17/2020, 8:26 AM

## 2020-07-18 DIAGNOSIS — N179 Acute kidney failure, unspecified: Secondary | ICD-10-CM | POA: Diagnosis not present

## 2020-07-18 LAB — CBC WITH DIFFERENTIAL/PLATELET
Abs Immature Granulocytes: 0.01 10*3/uL (ref 0.00–0.07)
Basophils Absolute: 0 10*3/uL (ref 0.0–0.1)
Basophils Relative: 0 %
Eosinophils Absolute: 0 10*3/uL (ref 0.0–0.5)
Eosinophils Relative: 0 %
HCT: 30.1 % — ABNORMAL LOW (ref 39.0–52.0)
Hemoglobin: 10 g/dL — ABNORMAL LOW (ref 13.0–17.0)
Immature Granulocytes: 0 %
Lymphocytes Relative: 15 %
Lymphs Abs: 0.7 10*3/uL (ref 0.7–4.0)
MCH: 26.7 pg (ref 26.0–34.0)
MCHC: 33.2 g/dL (ref 30.0–36.0)
MCV: 80.5 fL (ref 80.0–100.0)
Monocytes Absolute: 0.8 10*3/uL (ref 0.1–1.0)
Monocytes Relative: 17 %
Neutro Abs: 3.3 10*3/uL (ref 1.7–7.7)
Neutrophils Relative %: 68 %
Platelets: 263 10*3/uL (ref 150–400)
RBC: 3.74 MIL/uL — ABNORMAL LOW (ref 4.22–5.81)
RDW: 14.7 % (ref 11.5–15.5)
WBC: 4.9 10*3/uL (ref 4.0–10.5)
nRBC: 0 % (ref 0.0–0.2)

## 2020-07-18 LAB — COMPREHENSIVE METABOLIC PANEL
ALT: 22 U/L (ref 0–44)
AST: 21 U/L (ref 15–41)
Albumin: 3 g/dL — ABNORMAL LOW (ref 3.5–5.0)
Alkaline Phosphatase: 89 U/L (ref 38–126)
Anion gap: 9 (ref 5–15)
BUN: 26 mg/dL — ABNORMAL HIGH (ref 8–23)
CO2: 27 mmol/L (ref 22–32)
Calcium: 9 mg/dL (ref 8.9–10.3)
Chloride: 101 mmol/L (ref 98–111)
Creatinine, Ser: 1.93 mg/dL — ABNORMAL HIGH (ref 0.61–1.24)
GFR, Estimated: 37 mL/min — ABNORMAL LOW (ref >60)
Glucose, Bld: 121 mg/dL — ABNORMAL HIGH (ref 70–99)
Potassium: 3.7 mmol/L (ref 3.5–5.1)
Sodium: 137 mmol/L (ref 135–145)
Total Bilirubin: 0.8 mg/dL (ref 0.3–1.2)
Total Protein: 6.5 g/dL (ref 6.5–8.1)

## 2020-07-18 NOTE — Progress Notes (Signed)
Patient given a Urinary leg bag to use at home. Instructed on how to use it. Patient doesn't appear to be  Interested in using it. I asked him what his concerns were and he just kept shaking his head. Explained it would be less burdensome for him when he goes out. Instructed to use the large urinary bag at home. Will check with him again to see if he remembers how to use it.

## 2020-07-18 NOTE — Progress Notes (Signed)
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 Prior treatment for prostate cancer 2 years prior 2020 PSA elevation 06/02/2020 without dysuria urgency flank pain-developed 12/22 2-week history urinary straining and hesitancy-given 30-day course of Bactrim urine analysis performed showing negative nitrites leukocytes and started on Lasix in addition for lower extremity edema--Urine culture not performed-return to PCP office 12/29-at that time seemed better had [facility] 08/16/2020  Return to ED 12/31 with difficulty passing urine no output persistent leg edema which is new BUNs/creatinine 62/8.3 WBC 6.2 Foley catheter placed with 3 L urine output  Assessment & Plan:   Active Problems:   Essential hypertension   HLD (hyperlipidemia)   Type II diabetes mellitus with manifestations (HCC)   Prostate cancer (HCC)   Iron deficiency anemia due to dietary causes   Leg edema   AKI (acute kidney injury) (HCC)   Abnormal EKG   1. Postobstructive AKI secondary to prostate cancer, use of diuretics/Bactrim a. Discontinue HCTZ, Lasix, Bactrim from prior to admission b. Hold further magnesium replacement recheck in a.m. c. Start saline 50 cc/h but saline lock on 1/2 d. Continue alfuzosin e. Liberalize diet to allow heart healthy f. RN aware to start leg bag teaching etc. as patient will discharged with Foley catheter and I will discuss with family to come to learn 2. Asymptomatic PVCs with new RBBB LAFB 3. Moderately dilated aortic root/EF this admission 60-65% without wall motion abnormality a. Hold CT/MRI until outpatient given AKI b. Continuing amlodipine 5 daily c. As needed for blood pressure 4. Lower extremity edema a. Lasix on hold as above 5. DM TY 2 diet controlled a. CBGs ranging 1 21-1 13 6. Iron  deficiency anemia a. Hemoglobin stable 10.2 range b. This admission patient received Feraheme  DVT prophylaxis: Heparin Code Status: DNR confirmed Family Communication: None present at this time Disposition:  Status is: Inpatient  Remains inpatient appropriate because:Persistent severe electrolyte disturbances, Ongoing diagnostic testing needed not appropriate for outpatient work up and IV treatments appropriate due to intensity of illness or inability to take PO   Dispo: The patient is from: Home              Anticipated d/c is to: Home              Anticipated d/c date is: 3 days              Patient currently is not medically stable to d/c.   Consultants:   None currently  Procedures:   Antimicrobials: None   Subjective:  Coherent awake no distress Passing good urine no chest pain no fever  Objective: Vitals:   07/17/20 1955 07/18/20 0442 07/18/20 0737 07/18/20 0935  BP: (!) 115/58 126/68  (!) 112/59  Pulse: 82 75    Resp: 16 16    Temp: 99.8 F (37.7 C) 98.8 F (37.1 C)    TempSrc: Oral Oral    SpO2: 96% 95% 96%   Weight:      Height:        Intake/Output Summary (Last 24 hours) at 07/18/2020 1056 Last data filed at 07/18/2020 0900 Gross per 24 hour  Intake 1880.33 ml  Output 2950 ml  Net -1069.67 ml   Filed Weights   07/16/20 1715  Weight: 129 kg    Examination:  Awake coherent pleasant no  distress very pleasant EOMI NCAT no focal deficit Abdomen obese nontender nondistended no rebound ROM intact Psych euthymic Power 5/5   Data Reviewed: I have personally reviewed following labs and imaging studies Sodium 121-->131-->137 BUN/creatinine 62/8.3-->50/4.1-->26/1.9 Bicarb 20-->23-->27 Hemoglobin 10.0 Platelet 243  Radiology Studies: ECHOCARDIOGRAM COMPLETE  Result Date: 07/17/2020    ECHOCARDIOGRAM REPORT   Patient Name:   Benjamin Hamilton Date of Exam: 07/17/2020 Medical Rec #:  [id number]        Height:       73.0 in Accession #:    [id number]        Weight:       284.4 lb Date of Birth:  Jan 18, 1951        BSA:          2.499 m Patient Age:    57 years         BP:           117/70 mmHg Patient Gender: M                HR:           76 bpm. Exam Location:  Inpatient Procedure: 2D Echo, Cardiac Doppler and Color Doppler Indications:    R94.31 Abnormal EKG  History:        Patient has prior history of Echocardiogram examinations, most                 recent 04/28/2011. Signs/Symptoms:Chest Pain and Dyspnea; Risk                 Factors:Hypertension and Dyslipidemia.  Sonographer:    Jonelle Sidle Dance Referring Phys: [id number] [place]  1. Moderately dilated aortic root (4.5 cm); suggest CTA or MRA to further assess.  2. Left ventricular ejection fraction, by estimation, is 60 to 65%. The left ventricle has normal function. The left ventricle has no regional wall motion abnormalities. There is mild left ventricular hypertrophy. Left ventricular diastolic parameters are consistent with Grade I diastolic dysfunction (impaired relaxation).  3. Right ventricular systolic function is normal. The right ventricular size is normal.  4. The mitral valve is normal in structure. Trivial mitral valve regurgitation. No evidence of mitral stenosis.  5. The aortic valve is tricuspid. Aortic valve regurgitation is not visualized. Mild aortic valve sclerosis is present, with no evidence of aortic valve stenosis.  6. Aortic dilatation noted. There is moderate dilatation of the aortic root, measuring 45 mm. There is mild dilatation of the ascending aorta, measuring 40 mm.  7. The inferior vena cava is normal in size with greater than 50% respiratory variability, suggesting right atrial pressure of 3 mmHg. FINDINGS  Left Ventricle: Left ventricular ejection fraction, by estimation, is 60 to 65%. The left ventricle has normal function. The left ventricle has no regional wall motion abnormalities. The left ventricular internal cavity size was normal in size. There is   mild left ventricular hypertrophy. Left ventricular diastolic parameters are consistent with Grade I diastolic dysfunction (impaired relaxation). Right Ventricle: The right ventricular size is normal. Right ventricular systolic function is normal. Left Atrium: Left atrial size was normal in size. Right Atrium: Right atrial size was normal in size. Pericardium: There is no evidence of pericardial effusion. Mitral Valve: The mitral valve is normal in structure. Trivial mitral valve regurgitation. No evidence of mitral valve stenosis. Tricuspid Valve: The tricuspid valve is normal in structure. Tricuspid valve regurgitation is trivial. No evidence of tricuspid stenosis. Aortic Valve: The aortic valve  is tricuspid. Aortic valve regurgitation is not visualized. Mild aortic valve sclerosis is present, with no evidence of aortic valve stenosis. Pulmonic Valve: The pulmonic valve was normal in structure. Pulmonic valve regurgitation is trivial. No evidence of pulmonic stenosis. Aorta: Aortic dilatation noted. There is moderate dilatation of the aortic root, measuring 45 mm. There is mild dilatation of the ascending aorta, measuring 40 mm. Venous: The inferior vena cava is normal in size with greater than 50% respiratory variability, suggesting right atrial pressure of 3 mmHg. IAS/Shunts: No atrial level shunt detected by color flow Doppler. Additional Comments: Moderately dilated aortic root (4.5 cm); suggest CTA or MRA to further assess.  LEFT VENTRICLE PLAX 2D LVIDd:         4.80 cm  Diastology LVIDs:         3.30 cm  LV e' medial:    5.87 cm/s LV PW:         1.50 cm  LV E/e' medial:  10.5 LV IVS:        1.30 cm  LV e' lateral:   7.62 cm/s LVOT diam:     2.40 cm  LV E/e' lateral: 8.1 LV SV:         120 LV SV Index:   48 LVOT Area:     4.52 cm  RIGHT VENTRICLE             IVC RV Basal diam:  3.60 cm     IVC diam: 2.00 cm RV Mid diam:    2.60 cm RV S prime:     17.70 cm/s TAPSE (M-mode): 2.0 cm LEFT ATRIUM               Index       RIGHT ATRIUM           Index LA diam:        4.40 cm  1.76 cm/m  RA Area:     21.40 cm LA Vol (A2C):   102.0 ml 40.82 ml/m RA Volume:   63.50 ml  25.41 ml/m LA Vol (A4C):   61.9 ml  24.77 ml/m LA Biplane Vol: 79.0 ml  31.61 ml/m  AORTIC VALVE LVOT Vmax:   142.00 cm/s LVOT Vmean:  89.100 cm/s LVOT VTI:    0.266 m  AORTA Ao Root diam: 4.50 cm Ao Asc diam:  4.00 cm MITRAL VALVE MV Area (PHT): 2.66 cm    SHUNTS MV Decel Time: 285 msec    Systemic VTI:  0.27 m MV E velocity: 61.80 cm/s  Systemic Diam: 2.40 cm MV A velocity: 63.40 cm/s MV E/A ratio:  0.97 Olga Millers MD Electronically signed by Olga Millers MD Signature Date/Time: 07/17/2020/10:29:05 AM    Final      Scheduled Meds: . alfuzosin  10 mg Oral Q breakfast  . amLODipine  5 mg Oral Daily  . aspirin EC  81 mg Oral Daily  . Chlorhexidine Gluconate Cloth  6 each Topical Daily  . heparin  5,000 Units Subcutaneous Q8H  . mometasone-formoterol  2 puff Inhalation BID  . sodium chloride flush  3 mL Intravenous Q12H   Continuous Infusions: . sodium chloride 50 mL/hr at 07/17/20 1350     LOS: 2 days    Time spent: 20  Rhetta Mura, MD [facility] To contact the attending provider between 7A-7P or the covering provider during after hours 7P-7A, please log into the web site [email or web address] and access using universal [place] password for that web site. If  you do not have the password, please call the hospital operator.  07/18/2020, 10:56 AM

## 2020-07-19 ENCOUNTER — Inpatient Hospital Stay (HOSPITAL_COMMUNITY): Payer: Medicare Other

## 2020-07-19 DIAGNOSIS — N179 Acute kidney failure, unspecified: Secondary | ICD-10-CM | POA: Diagnosis not present

## 2020-07-19 DIAGNOSIS — R609 Edema, unspecified: Secondary | ICD-10-CM | POA: Diagnosis not present

## 2020-07-19 LAB — CBC WITH DIFFERENTIAL/PLATELET
Abs Immature Granulocytes: 0.05 10*3/uL (ref 0.00–0.07)
Basophils Absolute: 0 10*3/uL (ref 0.0–0.1)
Basophils Relative: 0 %
Eosinophils Absolute: 0.1 10*3/uL (ref 0.0–0.5)
Eosinophils Relative: 2 %
HCT: 31.6 % — ABNORMAL LOW (ref 39.0–52.0)
Hemoglobin: 10.3 g/dL — ABNORMAL LOW (ref 13.0–17.0)
Immature Granulocytes: 1 %
Lymphocytes Relative: 16 %
Lymphs Abs: 0.8 10*3/uL (ref 0.7–4.0)
MCH: 26.7 pg (ref 26.0–34.0)
MCHC: 32.6 g/dL (ref 30.0–36.0)
MCV: 81.9 fL (ref 80.0–100.0)
Monocytes Absolute: 0.7 10*3/uL (ref 0.1–1.0)
Monocytes Relative: 13 %
Neutro Abs: 3.4 10*3/uL (ref 1.7–7.7)
Neutrophils Relative %: 68 %
Platelets: 261 10*3/uL (ref 150–400)
RBC: 3.86 MIL/uL — ABNORMAL LOW (ref 4.22–5.81)
RDW: 15 % (ref 11.5–15.5)
WBC: 5.1 10*3/uL (ref 4.0–10.5)
nRBC: 0 % (ref 0.0–0.2)

## 2020-07-19 LAB — COMPREHENSIVE METABOLIC PANEL
ALT: 23 U/L (ref 0–44)
AST: 18 U/L (ref 15–41)
Albumin: 3.1 g/dL — ABNORMAL LOW (ref 3.5–5.0)
Alkaline Phosphatase: 92 U/L (ref 38–126)
Anion gap: 10 (ref 5–15)
BUN: 23 mg/dL (ref 8–23)
CO2: 25 mmol/L (ref 22–32)
Calcium: 9.1 mg/dL (ref 8.9–10.3)
Chloride: 105 mmol/L (ref 98–111)
Creatinine, Ser: 1.35 mg/dL — ABNORMAL HIGH (ref 0.61–1.24)
GFR, Estimated: 58 mL/min — ABNORMAL LOW (ref >60)
Glucose, Bld: 127 mg/dL — ABNORMAL HIGH (ref 70–99)
Potassium: 3.7 mmol/L (ref 3.5–5.1)
Sodium: 140 mmol/L (ref 135–145)
Total Bilirubin: 0.5 mg/dL (ref 0.3–1.2)
Total Protein: 6.8 g/dL (ref 6.5–8.1)

## 2020-07-19 MED ORDER — HYDROCHLOROTHIAZIDE 25 MG PO TABS
12.5000 mg | ORAL_TABLET | Freq: Every day | ORAL | Status: DC
  Administered 2020-07-19 – 2020-07-20 (×2): 12.5 mg via ORAL
  Filled 2020-07-19 (×2): qty 1

## 2020-07-19 MED ORDER — FUROSEMIDE 20 MG PO TABS
20.0000 mg | ORAL_TABLET | Freq: Every day | ORAL | Status: DC
  Administered 2020-07-19 – 2020-07-20 (×2): 20 mg via ORAL
  Filled 2020-07-19 (×2): qty 1

## 2020-07-19 NOTE — Evaluation (Signed)
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  Pt currently with functional limitations due to the deficits listed below (see PT Problem List). Pt will benefit from skilled PT to increase their independence and safety with mobility to allow discharge to the venue listed below.  Pt reports left LE weakness which is not baseline and requiring RW for mobility at this time.  Pt reports he is typically independent and concerned about d/c home alone.  Pt agreeable to SNF upon d/c to improve strength and mobility prior to d/c home alone.     Follow Up Recommendations SNF    Equipment Recommendations  None recommended by PT (RW already brought to room)    Recommendations for Other Services       Precautions / Restrictions Precautions Precautions: Fall      Mobility  Bed Mobility Overal bed mobility: Needs Assistance Bed Mobility: Supine to Sit     Supine to sit: Min assist     General bed mobility comments: assist for L LE    Transfers Overall transfer level: Needs assistance Equipment used: Rolling walker (2 wheeled) Transfers: Sit to/from Stand Sit to Stand: Min guard         General transfer comment: verbal cues for safe use of RW, UE placement  Ambulation/Gait Ambulation/Gait assistance: Min guard Gait Distance (Feet): 120 Feet Assistive device: Rolling walker (2 wheeled) Gait Pattern/deviations: Step-through pattern;Decreased stride length;Antalgic;Decreased stance time - left Gait velocity: decr   General Gait Details: verbal cues for RW positioning, posture, distance to tolerance  Stairs             Wheelchair Mobility    Modified Rankin (Stroke Patients Only)       Balance Overall balance assessment: Needs assistance         Standing balance support: Bilateral upper extremity supported;During functional activity Standing balance-Leahy Scale: Poor Standing balance comment: reliant on UE at this time                             Pertinent Vitals/Pain Pain Assessment: Faces Faces Pain Scale: Hurts a little bit Pain Location: feet (edema) Pain Descriptors / Indicators: Tightness Pain Intervention(s): Monitored during session;Repositioned    Home Living Family/patient expects to be discharged to:: Private residence Living Arrangements: Alone     Home Access: Level entry     Home Layout: One level Home Equipment: None      Prior Function Level of Independence: Independent               Hand Dominance        Extremity/Trunk Assessment        Lower Extremity Assessment Lower Extremity Assessment: Generalized weakness;LLE deficits/detail;RLE deficits/detail RLE Deficits / Details: right ankle and foot more edematous then left LLE Deficits / Details: observed 2+/5, pt requiring assist to bring LE over EOB or lift, unable to perform SLR       Communication   Communication: No difficulties  Cognition Arousal/Alertness: Awake/alert Behavior During Therapy: WFL for tasks assessed/performed Overall Cognitive Status: Within Functional Limits for tasks assessed  General Comments      Exercises     Assessment/Plan    PT Assessment Patient needs continued PT services  PT Problem List Decreased strength;Decreased activity tolerance;Decreased mobility;Decreased balance       PT Treatment Interventions DME instruction;Therapeutic exercise;Gait training;Balance training;Functional mobility training;Therapeutic activities;Patient/family education    PT Goals (Current goals can be  found in the Care Plan section)  Acute Rehab PT Goals Patient Stated Goal: pt concerned about going home alone PT Goal Formulation: With patient Time For Goal Achievement: 08/02/20 Potential to Achieve Goals: Good    Frequency Min 2X/week   Barriers to discharge        Co-evaluation               AM-PAC PT "6 Clicks" Mobility  Outcome Measure Help needed turning from your back to your side while in a flat bed without using bedrails?: A Little Help needed moving from lying on your back to sitting on the side of a flat bed without using bedrails?: A Little Help needed moving to and from a bed to a chair (including a wheelchair)?: A Little Help needed standing up from a chair using your arms (e.g., wheelchair or bedside chair)?: A Little Help needed to walk in hospital room?: A Little Help needed climbing 3-5 steps with a railing? : A Lot 6 Click Score: 17    End of Session Equipment Utilized During Treatment: Gait belt Activity Tolerance: Patient tolerated treatment well Patient left: in chair;with call bell/phone within reach (pt aware to call for assist out of recliner) Nurse Communication: Mobility status (RN observed ambulation) PT Visit Diagnosis: Other abnormalities of gait and mobility (R26.89)    Time: 7262-0355 PT Time Calculation (min) (ACUTE ONLY): 17 min   Charges:   PT Evaluation $PT Eval Low Complexity: 1 Low        Kati PT, DPT Acute Rehabilitation Services Pager: [phone] Office: [phone]  Maida Sale E 07/19/2020, 3:47 PM

## 2020-07-19 NOTE — Care Management Important Message (Signed)
Important Message  Patient Details IM Letter given to the Patient. Name: KINGSTEN ENFIELD MRN: [id number] Date of Birth: July 30, 1950   Medicare Important Message Given:  Yes     Caren Macadam 07/19/2020, 11:37 AM

## 2020-07-19 NOTE — Progress Notes (Signed)
Right lower extremity venous duplex has been completed. Preliminary results can be found in CV Proc through chart review.   07/19/20 2:31 PM Olen Cordial RVT

## 2020-07-19 NOTE — TOC Initial Note (Signed)
Transition of Care [facility]) - Initial/Assessment Note    Patient Details  Name: Benjamin Hamilton MRN: [id number] Date of Birth: Nov 28, 1950  Transition of Care [facility]) CM/SW Contact:    CLEMENTS, Meriam Sprague, RN Phone Number: 07/19/2020, 2:02 PM  Clinical Narrative:                   Expected Discharge Plan: Home w Home Health Services Barriers to Discharge: Continued Medical Work up   Patient Goals and CMS Choice Patient states their goals for this hospitalization and ongoing recovery are:: to get home CMS Medicare.gov Compare Post Acute Care list provided to:: Patient Choice offered to / list presented to : Patient  Expected Discharge Plan and Services Expected Discharge Plan: Home w Home Health Services   Discharge Planning Services: CM Consult Post Acute Care Choice: Home Health Living arrangements for the past 2 months: Apartment                 DME Arranged: Walker rolling DME Agency: [employer] Date DME Agency Contacted: 07/19/20 Time DME Agency Contacted: 1400 Representative spoke with at DME Agency: Illa Level HH Arranged: RN,PT,Nurse's Aide HH Agency: [facility] Date [facility] Agency Contacted: 07/19/20 Time HH Agency Contacted: 1401 Representative spoke with at [facility] Agency: Frances Furbish  Prior Living Arrangements/Services Living arrangements for the past 2 months: Apartment Lives with:: Self Patient language and need for interpreter reviewed:: Yes Do you feel safe going back to the place where you live?: Yes      Need for Family Participation in Patient Care: Yes (Comment) Care giver support system in place?: Yes (comment)   Criminal Activity/Legal Involvement Pertinent to Current Situation/Hospitalization: No - Comment as needed  Activities of Daily Living Home Assistive Devices/Equipment: Cane (specify quad or straight) ADL Screening (condition at time of admission) Patient's cognitive ability adequate to safely complete daily activities?: Yes Is the patient deaf or  have difficulty hearing?: No Does the patient have difficulty seeing, even when wearing glasses/contacts?: No Does the patient have difficulty concentrating, remembering, or making decisions?: No Patient able to express need for assistance with ADLs?: No Does the patient have difficulty dressing or bathing?: No Independently performs ADLs?: Yes (appropriate for developmental age) Does the patient have difficulty walking or climbing stairs?: No Weakness of Legs: None Weakness of Arms/Hands: None  Permission Sought/Granted Permission sought to share information with : Photographer granted to share info w AGENCY: Frances Furbish        Emotional Assessment Appearance:: Appears stated age   Affect (typically observed): Calm Orientation: : Oriented to Self,Oriented to Place,Oriented to  Time,Oriented to Situation Alcohol / Substance Use: Not Applicable    Admission diagnosis:  Urinary retention [R33.9] AKI (acute kidney injury) (HCC) [N17.9] Patient Active Problem List   Diagnosis Date Noted  . AKI (acute kidney injury) (HCC) 07/16/2020  . Abnormal EKG 07/16/2020  . Leg edema 07/14/2020  . BPH with obstruction/lower urinary tract symptoms 07/08/2020  . Iron deficiency anemia due to dietary causes 07/08/2020  . Acute prostatitis 07/07/2020  . Deficiency anemia 07/07/2020  . Type II diabetes mellitus with manifestations (HCC) 07/07/2020  . Prostate cancer (HCC) 07/07/2020  . PSA elevation 06/02/2020  . Vitamin D deficiency 06/02/2020  . Left knee pain 05/06/2020  . Right knee pain 05/06/2020  . Rash 08/12/2019  . Intertrigo 06/03/2019  . Ventral hernia 04/05/2019  . Hyperglycemia 03/19/2018  . HLD (hyperlipidemia) 03/19/2018  . Insomnia 12/15/2017  .  Preventative health care 12/13/2017  . Hearing impairment 10/02/2016  . Allergic rhinitis 11/21/2012  . Paroxysmal ventricular tachycardia (HCC) 05/27/2010  . Essential hypertension 10/19/2009  .  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 [place] [address] Phone: [phone] Fax: [phone]  [facility] - [place], [place] - [address] [facility] [address] [place] [address] Phone: [phone] Fax: [phone]  [facility] [address] [place]), [place] - [address] [place]) [place] [address] Phone: [phone] Fax: [phone]     Social Determinants of Health (SDOH) Interventions    Readmission Risk Interventions No flowsheet data found.

## 2020-07-19 NOTE — Progress Notes (Signed)
Patient sat up in the chair for 3 hours today.

## 2020-07-19 NOTE — TOC Progression Note (Signed)
Transition of Care [facility]) - Progression Note    Patient Details  Name: ESWIN WORRELL MRN: [id number] Date of Birth: March 23, 1951  [facility]) CM/SW Contact  Jaliana Medellin, Meriam Sprague, RN Phone Number: 07/19/2020, 3:36 PM  Clinical Narrative:     Pt is interested in considering SNF placement. FL2 faxed out for available SNF beds.  Expected Discharge Plan: Home w Home Health Services Barriers to Discharge: Continued Medical Work up  Expected Discharge Plan and Services Expected Discharge Plan: Home w Home Health Services   Discharge Planning Services: CM Consult Post Acute Care Choice: Home Health Living arrangements for the past 2 months: Apartment                 DME Arranged: Walker rolling DME Agency: [employer] Date DME Agency Contacted: 07/19/20 Time DME Agency Contacted: 1400 Representative spoke with at DME Agency: Illa Level HH Arranged: RN,PT,Nurse's Aide HH Agency: [facility] Date [facility] Agency Contacted: 07/19/20 Time HH Agency Contacted: 1401 Representative spoke with at [facility] Agency: Frances Furbish   Social Determinants of Health (SDOH) Interventions    Readmission Risk Interventions No flowsheet data found.

## 2020-07-19 NOTE — [place] FL2 (Signed)
[place] MEDICAID FL2 LEVEL OF CARE SCREENING TOOL     IDENTIFICATION  Patient Name: Benjamin Hamilton Birthdate: [birth year] Sex: male Admission Date (Current Location): 07/16/2020  [facility] and [place] Number:  Producer, television/film/video and Address:  [facility],  [address], [place] [address]      Provider Number: [id number]  Attending Physician Name and Address:  Rhetta Mura, MD  Relative Name and Phone Number:       Current Level of Care: Hospital Recommended Level of Care: Skilled Nursing Facility Prior Approval Number:    Date Approved/Denied:   PASRR Number: [id number]  Discharge Plan: SNF    Current Diagnoses: Patient Active Problem List   Diagnosis Date Noted  . AKI (acute kidney injury) (HCC) 07/16/2020  . Abnormal EKG 07/16/2020  . Leg edema 07/14/2020  . BPH with obstruction/lower urinary tract symptoms 07/08/2020  . Iron deficiency anemia due to dietary causes 07/08/2020  . Acute prostatitis 07/07/2020  . Deficiency anemia 07/07/2020  . Type II diabetes mellitus with manifestations (HCC) 07/07/2020  . Prostate cancer (HCC) 07/07/2020  . PSA elevation 06/02/2020  . Vitamin D deficiency 06/02/2020  . Left knee pain 05/06/2020  . Right knee pain 05/06/2020  . Rash 08/12/2019  . Intertrigo 06/03/2019  . Ventral hernia 04/05/2019  . Hyperglycemia 03/19/2018  . HLD (hyperlipidemia) 03/19/2018  . Insomnia 12/15/2017  . Preventative health care 12/13/2017  . Hearing impairment 10/02/2016  . Allergic rhinitis 11/21/2012  . Paroxysmal ventricular tachycardia (HCC) 05/27/2010  . Essential hypertension 10/19/2009  . Asthma, intrinsic 10/19/2009    Orientation RESPIRATION BLADDER Height & Weight     Self,Time,Situation,Place  Normal Indwelling catheter Weight: 129 kg Height:  6\' 1"  (185.4 cm)  BEHAVIORAL SYMPTOMS/MOOD NEUROLOGICAL BOWEL NUTRITION STATUS      Continent Diet (Renal with 1200cc fluid restriction)  AMBULATORY  STATUS COMMUNICATION OF NEEDS Skin   Limited Assist Verbally Normal                       Personal Care Assistance Level of Assistance  Bathing,Dressing Bathing Assistance: Limited assistance   Dressing Assistance: Limited assistance     Functional Limitations Info  Sight,Hearing Sight Info: Adequate Hearing Info: Adequate      SPECIAL CARE FACTORS FREQUENCY  PT (By licensed PT),OT (By licensed OT)     PT Frequency: 5 x weekly OT Frequency: 5 x weekly            Contractures Contractures Info: Not present    Additional Factors Info  Code Status,Allergies Code Status Info: DNR Allergies Info: No Known drug allergies           Current Medications (07/19/2020):  This is the current hospital active medication list Current Facility-Administered Medications  Medication Dose Route Frequency Provider Last Rate Last Admin  . acetaminophen (TYLENOL) tablet 650 mg  650 mg Oral Q6H PRN 09/16/2020, MD       Or  . acetaminophen (TYLENOL) suppository 650 mg  650 mg Rectal Q6H PRN Jae Dire, MD      . albuterol (VENTOLIN HFA) 108 (90 Base) MCG/ACT inhaler 2 puff  2 puff Inhalation Q4H PRN Jae Dire, MD      . alfuzosin (UROXATRAL) 24 hr tablet 10 mg  10 mg Oral Q breakfast Jae Dire, MD   10 mg at 07/19/20 1034  . amLODipine (NORVASC) tablet 5 mg  5 mg Oral Daily 09/16/20, MD  5 mg at 07/19/20 1031  . aspirin EC tablet 81 mg  81 mg Oral Daily Harold Hedge, MD   81 mg at 07/19/20 1031  . Chlorhexidine Gluconate Cloth 2 % PADS 6 each  6 each Topical Daily Nita Sells, MD   6 each at 07/18/20 1048  . heparin injection 5,000 Units  5,000 Units Subcutaneous Q8H Harold Hedge, MD   5,000 Units at 07/19/20 [phone]  . hydrALAZINE (APRESOLINE) injection 10 mg  10 mg Intravenous Q8H PRN Harold Hedge, MD      . mometasone-formoterol [facility]) 200-5 MCG/ACT inhaler 2 puff  2 puff Inhalation BID Harold Hedge, MD   2 puff at 07/19/20 0818  .  polyethylene glycol (MIRALAX / GLYCOLAX) packet 17 g  17 g Oral Daily PRN Harold Hedge, MD      . sodium chloride flush (NS) 0.9 % injection 3 mL  3 mL Intravenous Q12H Harold Hedge, MD   3 mL at 07/19/20 1035     Discharge Medications: Please see discharge summary for a list of discharge medications.  Relevant Imaging Results:  Relevant Lab Results:   Additional Information covid vaccinated ss# [id number]  Samarah Hogle, Marjie Skiff, RN

## 2020-07-19 NOTE — Progress Notes (Signed)
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 Prior treatment for prostate cancer 2 years prior 2020 PSA elevation 06/02/2020 without dysuria urgency flank pain-developed 12/22 2-week history urinary straining and hesitancy-given 30-day course of Bactrim urine analysis performed showing negative nitrites leukocytes and started on Lasix in addition for lower extremity edema--Urine culture not performed-return to PCP office 12/29-at that time seemed better had [facility] appointment 08/16/2020  Return to ED 12/31 with difficulty passing urine no output persistent leg edema which is new BUNs/creatinine 62/8.3 WBC 6.2 Foley catheter placed with 3 L urine output  Assessment & Plan:   Active Problems:   Essential hypertension   HLD (hyperlipidemia)   Type II diabetes mellitus with manifestations (HCC)   Prostate cancer (HCC)   Iron deficiency anemia due to dietary causes   Leg edema   AKI (acute kidney injury) (HCC)   Abnormal EKG   1. Postobstructive AKI secondary to prostate cancer, use of diuretics/Bactrim a. Discontinue Bactrim from prior to admission b. saline 50 cc/h locked on 1/2 c. Continue alfuzosin d. Liberalize diet to allow heart healthy e. Patient unwilling to learn how to use leg bag 2. Asymptomatic PVCs with new RBBB LAFB 3. Moderately dilated aortic root/EF this admission 60-65% without wall motion abnormality a. Hold CT/MRI until outpatient given AKI b. Continuing amlodipine 5 daily, restarted HCTZ 1/312.5 mg 4. Lower extremity edema a. Lasix cautiously resumed 1/3 at lower dose 20 b. Right lower extremity ultrasound negative for DVT 5. DM TY 2 diet controlled a. CBGs ranging 100-1 30 6. Iron deficiency anemia a. Hemoglobin stable 10.2 range b. This admission patient received Feraheme  DVT  prophylaxis: Heparin Code Status: DNR confirmed Family Communication: None present at this time-I have tried to call his sister numerous instances Disposition:  Status is: Inpatient  Remains inpatient appropriate because:Persistent severe electrolyte disturbances, Ongoing diagnostic testing needed not appropriate for outpatient work up and IV treatments appropriate due to intensity of illness or inability to take PO   Dispo: The patient is from: Home              Anticipated d/c is to: Home              Anticipated d/c date is: 3 days              Patient currently is medically stable to d/c.   Consultants:   None currently  Procedures:   Antimicrobials: None   Subjective:  Coherent awake no distress Could hardly stand at the bedside-became unsteady and only was able to get up and then had to sit right back down so therapy was initiated He is eating and drinking He has increasing right lower extremity swelling so the ultrasound we did today was negative  Objective: Vitals:   07/18/20 2053 07/19/20 0818 07/19/20 1031 07/19/20 1417  BP: 133/71  127/60 136/68  Pulse: 83   81  Resp: 16   16  Temp: 98.6 F (37 C)   (!) 97.5 F (36.4 C)  TempSrc: Oral   Oral  SpO2: 97% 94%  92%  Weight:      Height:        Intake/Output Summary (Last 24 hours) at 07/19/2020 1546 Last data filed at 07/19/2020 1417 Gross per 24 hour  Intake 2690.41 ml  Output 1600 ml  Net  1090.41 ml   Filed Weights   07/16/20 1715  Weight: 129 kg    Examination:  coherent pleasant no distress very pleasant EOMI NCAT no focal deficit Abdomen obese nontender nondistended no rebound ROM intact Psych euthymic Lower extremity edema present 2-3 pitting Power 5/5   Data Reviewed: I have personally reviewed following labs and imaging studies Sodium 121--> 140 BUN/creatinine 62/8.3-->50/4.1-->26/1.9-->23/1.3 Bicarb 20-->23-->25 Hemoglobin 10.3 Platelet 261  Radiology Studies: VAS [place] LOWER  EXTREMITY VENOUS (DVT)  Result Date: 07/19/2020  Lower Venous DVT Study Indications: Edema.  Risk Factors: None identified. Limitations: Poor ultrasound/tissue interface. Comparison Study: No prior studies. Performing Technologist: Oliver Hum RVT  Examination Guidelines: A complete evaluation includes B-mode imaging, spectral Doppler, color Doppler, and power Doppler as needed of all accessible portions of each vessel. Bilateral testing is considered an integral part of a complete examination. Limited examinations for reoccurring indications may be performed as noted. The reflux portion of the exam is performed with the patient in reverse Trendelenburg.  +---------+---------------+---------+-----------+----------+--------------+ RIGHT    CompressibilityPhasicitySpontaneityPropertiesThrombus Aging +---------+---------------+---------+-----------+----------+--------------+ CFV      Full           Yes      Yes                                 +---------+---------------+---------+-----------+----------+--------------+ SFJ      Full                                                        +---------+---------------+---------+-----------+----------+--------------+ FV Prox  Full                                                        +---------+---------------+---------+-----------+----------+--------------+ FV Mid   Full                                                        +---------+---------------+---------+-----------+----------+--------------+ FV Distal               Yes      Yes                                 +---------+---------------+---------+-----------+----------+--------------+ PFV      Full                                                        +---------+---------------+---------+-----------+----------+--------------+ POP      Full           Yes      Yes                                  +---------+---------------+---------+-----------+----------+--------------+ PTV  Full                                                        +---------+---------------+---------+-----------+----------+--------------+ PERO     Full                                                        +---------+---------------+---------+-----------+----------+--------------+   +----+---------------+---------+-----------+----------+--------------+ LEFTCompressibilityPhasicitySpontaneityPropertiesThrombus Aging +----+---------------+---------+-----------+----------+--------------+ CFV Full           Yes      Yes                                 +----+---------------+---------+-----------+----------+--------------+     Summary: RIGHT: - There is no evidence of deep vein thrombosis in the lower extremity. However, portions of this examination were limited- see technologist comments above.  - No cystic structure found in the popliteal fossa.  LEFT: - No evidence of common femoral vein obstruction.  *See table(s) above for measurements and observations. Electronically signed by Monica Martinez MD on 07/19/2020 at 2:45:37 PM.    Final      Scheduled Meds: . alfuzosin  10 mg Oral Q breakfast  . amLODipine  5 mg Oral Daily  . aspirin EC  81 mg Oral Daily  . Chlorhexidine Gluconate Cloth  6 each Topical Daily  . furosemide  20 mg Oral Daily  . heparin  5,000 Units Subcutaneous Q8H  . hydrochlorothiazide  12.5 mg Oral Daily  . mometasone-formoterol  2 puff Inhalation BID  . sodium chloride flush  3 mL Intravenous Q12H   Continuous Infusions:    LOS: 3 days    Time spent: 66  Nita Sells, MD [facility] To contact the attending provider between 7A-7P or the covering provider during after hours 7P-7A, please log into the web site [email or web address] and access using universal [place] password for that web site. If you do not have the password, please call the hospital  operator.  07/19/2020, 3:46 PM

## 2020-07-20 DIAGNOSIS — N179 Acute kidney failure, unspecified: Secondary | ICD-10-CM | POA: Diagnosis not present

## 2020-07-20 DIAGNOSIS — R9431 Abnormal electrocardiogram [ECG] [EKG]: Secondary | ICD-10-CM | POA: Diagnosis not present

## 2020-07-20 LAB — RENAL FUNCTION PANEL
Albumin: 3.1 g/dL — ABNORMAL LOW (ref 3.5–5.0)
Anion gap: 11 (ref 5–15)
BUN: 22 mg/dL (ref 8–23)
CO2: 26 mmol/L (ref 22–32)
Calcium: 9.3 mg/dL (ref 8.9–10.3)
Chloride: 102 mmol/L (ref 98–111)
Creatinine, Ser: 1.2 mg/dL (ref 0.61–1.24)
GFR, Estimated: >60 mL/min (ref >60)
Glucose, Bld: 128 mg/dL — ABNORMAL HIGH (ref 70–99)
Phosphorus: 2.8 mg/dL (ref 2.5–4.6)
Potassium: 3.6 mmol/L (ref 3.5–5.1)
Sodium: 139 mmol/L (ref 135–145)

## 2020-07-20 LAB — SARS CORONAVIRUS 2 (TAT 6-24 HRS): SARS Coronavirus 2: NEGATIVE

## 2020-07-20 MED ORDER — FUROSEMIDE 40 MG PO TABS: 40.0000 mg | ORAL_TABLET | Freq: Every day | ORAL | 5 refills | Status: AC

## 2020-07-20 NOTE — TOC Transition Note (Signed)
Transition of Care [facility]) - CM/SW Discharge Note   Patient Details  Name: Benjamin Hamilton MRN: [id number] Date of Birth: 16-Oct-1950  Transition of [facility] CM/SW Contact:  Bartholome Bill, RN Phone Number: 07/20/2020, 1:59 PM   Clinical Narrative:    Pt given SNF bed offers and [facility] chosen.  Pt to dc to [employer] via [facility]. He will go to room 203 and RN will call report to [phone]. Yellow DNR on chart for transport.   Final next level of care: Skilled Nursing Facility Barriers to Discharge: No Barriers Identified   Patient Goals and CMS Choice Patient states their goals for this hospitalization and ongoing recovery are:: to get home CMS Medicare.gov Compare Post Acute Care list provided to:: Patient Choice offered to / list presented to : Patient  Discharge Placement              Patient chooses bed at: [facility] Patient to be transferred to facility by: [facility]      Discharge Plan and Services   Discharge Planning Services: CM Consult Post Acute Care Choice: Home Health          DME Arranged: Dan Humphreys rolling DME Agency: [employer] Date DME Agency Contacted: 07/19/20 Time DME Agency Contacted: 1400 Representative spoke with at DME Agency: [facility] HH Arranged: RN,PT,Nurse's Aide HH Agency: [facility] Date [facility] Agency Contacted: 07/19/20 Time HH Agency Contacted: 1401 Representative spoke with at [facility] Agency: Frances Furbish  Social Determinants of Health (SDOH) Interventions     Readmission Risk Interventions No flowsheet data found.

## 2020-07-20 NOTE — Discharge Summary (Addendum)
Physician Discharge Summary  TUG REUM [id number] DOB: 1951/04/28 DOA: 07/16/2020  PCP: Biagio Borg, MD  Admit date: 07/16/2020 Discharge date: 07/20/2020  Time spent: 26 minutes  Recommendations for Outpatient Follow-up:  1. Recommend renal panel in about 1 week with CBC 2. Needs outpatient follow-up for prostate cancer and other issues to be arranged and transport coordinated for his appointment at [facility] urology on 1/6 please ensure that this is done 3. Recommend outpatient CT chest/MRI of chest given moderately dilated aortic root can be coordinated once stabilized and after urology appointments-May require referral to cardiology etc. 4. Check A1c in 3 to 6 months  Discharge Diagnoses:  Active Problems:   Essential hypertension   HLD (hyperlipidemia)   Type II diabetes mellitus with manifestations (HCC)   Prostate cancer (HCC)   Iron deficiency anemia due to dietary causes   Leg edema   AKI (acute kidney injury) (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 Prior treatment for prostate cancer 2 years prior 2020 PSA elevation 06/02/2020 without dysuria urgency flank pain-developed 12/22 2-week history urinary straining and hesitancy-given 30-day course of Bactrim urine analysis performed showing negative nitrites leukocytes and started on Lasix in addition for lower extremity edema--Urine culture not performed-return to PCP office 12/29-at that time seemed better [facility] 08/16/2020  Return to ED 12/31 with difficulty passing urine no output persistent leg edema which is new BUNs/creatinine 62/8.3 WBC 6.2 Foley catheter placed with 3 L urine output  Hospital Course:  1. Postobstructive AKI secondary to prostate cancer, use of  diuretics/Bactrim a. Discontinue Bactrim from prior to admission b. saline 50 cc/h locked on 1/2 and labs have been stable since then c. Continue alfuzosin d. Liberalize diet to allow heart healthy e. Patient unwilling to learn how to use leg bag but will need coordination at skilled facility to learn how to do the same 2. Asymptomatic PVCs with new RBBB LAFB 3. Moderately dilated aortic root/EF this admission 60-65% without wall motion abnormality a. Hold CT/MRI until outpatient as he had AKI this admission and contrast load would worsen things-May require further cardiology input in the outpatient setting once imaging is done b. Continuing amlodipine 5 daily, restarted HCTZ 1/312.5 mg 4. Lower extremity edema a. Lasix cautiously resumed 1/3 and discharged on 40 daily Lasix b. Right lower extremity ultrasound negative for DVT 5. DM TY 2 diet controlled a. CBGs ranging  moderately controlled this hospital stay 6. Iron deficiency anemia a. Hemoglobin stable 10.2 range b. This admission patient received Feraheme   Procedures: Echocardiogram DVT study  Consultations:  None  Discharge Exam: Vitals:   07/20/20 0428 07/20/20 0854  BP: 131/75   Pulse: 79   Resp: 16   Temp: 99.7 F (37.6 C)   SpO2: 91% 96%    General: Awake coherent no distress EOMI NCAT no focal deficit Cardiovascular: S1-S2 no murmur no rub no gallop RRR Respiratory: Clinically clear no added sound no rales no rhonchi Lower extremity swelling is improved marginally he has a Foley in place  Discharge Instructions Allergies as of 07/20/2020   No Known Allergies     Medication List    STOP taking these medications   clotrimazole-betamethasone cream Commonly known as: Lotrisone   sulfamethoxazole-trimethoprim 800-160 MG tablet  Commonly known as: BACTRIM DS   traMADol 50 MG tablet Commonly known as: ULTRAM   triamcinolone 55 MCG/ACT Aero nasal inhaler Commonly known as: NASACORT   zolpidem 5 MG  tablet Commonly known as: AMBIEN     TAKE these medications   albuterol 108 (90 Base) MCG/ACT inhaler Commonly known as: Ventolin HFA Inhale 2 puffs into the lungs every 4 (four) hours as needed for wheezing or shortness of breath.   alfuzosin 10 MG 24 hr tablet Commonly known as: UROXATRAL Take 1 tablet (10 mg total) by mouth daily with breakfast.   amLODipine 5 MG tablet Commonly known as: NORVASC Take 1 tablet (5 mg total) by mouth daily.   aspirin EC 81 MG tablet Take 1 tablet (81 mg total) by mouth daily.   budesonide-formoterol 160-4.5 MCG/ACT inhaler Commonly known as: SYMBICORT Inhale 2 puffs into the lungs 2 (two) times daily.   furosemide 40 MG tablet Commonly known as: LASIX Take 1 tablet (40 mg total) by mouth daily. What changed:   when to take this  reasons to take this   hydrochlorothiazide 25 MG tablet Commonly known as: HYDRODIURIL Take 0.5 tablets (12.5 mg total) by mouth daily.            Durable Medical Equipment  (From admission, onward)         Start     Ordered   07/19/20 1359  For home use only DME Walker rolling  Once       Question Answer Comment  Walker: With 5 Inch Wheels   Patient needs a walker to treat with the following condition Weakness      07/19/20 1358            Discharge Instructions    Diet - low sodium heart healthy   Complete by: As directed    Diet - low sodium heart healthy   Complete by: As directed    Discharge instructions   Complete by: As directed    Increase activity slowly   Complete by: As directed    Increase activity slowly   Complete by: As directed        The results of significant diagnostics from this hospitalization (including imaging, microbiology, ancillary and laboratory) are listed below for reference.    Significant Diagnostic Studies: ECHOCARDIOGRAM COMPLETE  Result Date: 07/17/2020    ECHOCARDIOGRAM REPORT   Patient Name:   MALVERN BOBER Date of Exam: 07/17/2020 Medical  Rec #:  [id number]        Height:       73.0 in Accession #:    [id number]       Weight:       284.4 lb Date of Birth:  1950/08/29        BSA:          2.499 m Patient Age:    25 years         BP:           117/70 mmHg Patient Gender: M                HR:           76 bpm. Exam Location:  Inpatient Procedure: 2D Echo, Cardiac Doppler and Color Doppler Indications:    R94.31 Abnormal EKG  History:        Patient has prior history of Echocardiogram examinations, most                 recent 04/28/2011.  Signs/Symptoms:Chest Pain and Dyspnea; Risk                 Factors:Hypertension and Dyslipidemia.  Sonographer:    Elmarie Shiley Dance Referring Phys: [id number] JARED E SEGAL IMPRESSIONS  1. Moderately dilated aortic root (4.5 cm); suggest CTA or MRA to further assess.  2. Left ventricular ejection fraction, by estimation, is 60 to 65%. The left ventricle has normal function. The left ventricle has no regional wall motion abnormalities. There is mild left ventricular hypertrophy. Left ventricular diastolic parameters are consistent with Grade I diastolic dysfunction (impaired relaxation).  3. Right ventricular systolic function is normal. The right ventricular size is normal.  4. The mitral valve is normal in structure. Trivial mitral valve regurgitation. No evidence of mitral stenosis.  5. The aortic valve is tricuspid. Aortic valve regurgitation is not visualized. Mild aortic valve sclerosis is present, with no evidence of aortic valve stenosis.  6. Aortic dilatation noted. There is moderate dilatation of the aortic root, measuring 45 mm. There is mild dilatation of the ascending aorta, measuring 40 mm.  7. The inferior vena cava is normal in size with greater than 50% respiratory variability, suggesting right atrial pressure of 3 mmHg. FINDINGS  Left Ventricle: Left ventricular ejection fraction, by estimation, is 60 to 65%. The left ventricle has normal function. The left ventricle has no regional wall motion abnormalities.  The left ventricular internal cavity size was normal in size. There is  mild left ventricular hypertrophy. Left ventricular diastolic parameters are consistent with Grade I diastolic dysfunction (impaired relaxation). Right Ventricle: The right ventricular size is normal. Right ventricular systolic function is normal. Left Atrium: Left atrial size was normal in size. Right Atrium: Right atrial size was normal in size. Pericardium: There is no evidence of pericardial effusion. Mitral Valve: The mitral valve is normal in structure. Trivial mitral valve regurgitation. No evidence of mitral valve stenosis. Tricuspid Valve: The tricuspid valve is normal in structure. Tricuspid valve regurgitation is trivial. No evidence of tricuspid stenosis. Aortic Valve: The aortic valve is tricuspid. Aortic valve regurgitation is not visualized. Mild aortic valve sclerosis is present, with no evidence of aortic valve stenosis. Pulmonic Valve: The pulmonic valve was normal in structure. Pulmonic valve regurgitation is trivial. No evidence of pulmonic stenosis. Aorta: Aortic dilatation noted. There is moderate dilatation of the aortic root, measuring 45 mm. There is mild dilatation of the ascending aorta, measuring 40 mm. Venous: The inferior vena cava is normal in size with greater than 50% respiratory variability, suggesting right atrial pressure of 3 mmHg. IAS/Shunts: No atrial level shunt detected by color flow Doppler. Additional Comments: Moderately dilated aortic root (4.5 cm); suggest CTA or MRA to further assess.  LEFT VENTRICLE PLAX 2D LVIDd:         4.80 cm  Diastology LVIDs:         3.30 cm  LV e' medial:    5.87 cm/s LV PW:         1.50 cm  LV E/e' medial:  10.5 LV IVS:        1.30 cm  LV e' lateral:   7.62 cm/s LVOT diam:     2.40 cm  LV E/e' lateral: 8.1 LV SV:         120 LV SV Index:   48 LVOT Area:     4.52 cm  RIGHT VENTRICLE             IVC RV Basal diam:  3.60 cm  IVC diam: 2.00 cm RV Mid diam:    2.60 cm RV S  prime:     17.70 cm/s TAPSE (M-mode): 2.0 cm LEFT ATRIUM              Index       RIGHT ATRIUM           Index LA diam:        4.40 cm  1.76 cm/m  RA Area:     21.40 cm LA Vol (A2C):   102.0 ml 40.82 ml/m RA Volume:   63.50 ml  25.41 ml/m LA Vol (A4C):   61.9 ml  24.77 ml/m LA Biplane Vol: 79.0 ml  31.61 ml/m  AORTIC VALVE LVOT Vmax:   142.00 cm/s LVOT Vmean:  89.100 cm/s LVOT VTI:    0.266 m  AORTA Ao Root diam: 4.50 cm Ao Asc diam:  4.00 cm MITRAL VALVE MV Area (PHT): 2.66 cm    SHUNTS MV Decel Time: 285 msec    Systemic VTI:  0.27 m MV E velocity: 61.80 cm/s  Systemic Diam: 2.40 cm MV A velocity: 63.40 cm/s MV E/A ratio:  0.97 Olga Millers MD Electronically signed by Olga Millers MD Signature Date/Time: 07/17/2020/10:29:05 AM    Final    VAS [place] LOWER EXTREMITY VENOUS (DVT)  Result Date: 07/19/2020  Lower Venous DVT Study Indications: Edema.  Risk Factors: None identified. Limitations: Poor ultrasound/tissue interface. Comparison Study: No prior studies. Performing Technologist: Chanda Busing RVT  Examination Guidelines: A complete evaluation includes B-mode imaging, spectral Doppler, color Doppler, and power Doppler as needed of all accessible portions of each vessel. Bilateral testing is considered an integral part of a complete examination. Limited examinations for reoccurring indications may be performed as noted. The reflux portion of the exam is performed with the patient in reverse Trendelenburg.  +---------+---------------+---------+-----------+----------+--------------+ RIGHT    CompressibilityPhasicitySpontaneityPropertiesThrombus Aging +---------+---------------+---------+-----------+----------+--------------+ CFV      Full           Yes      Yes                                 +---------+---------------+---------+-----------+----------+--------------+ SFJ      Full                                                         +---------+---------------+---------+-----------+----------+--------------+ FV Prox  Full                                                        +---------+---------------+---------+-----------+----------+--------------+ FV Mid   Full                                                        +---------+---------------+---------+-----------+----------+--------------+ FV Distal               Yes      Yes                                 +---------+---------------+---------+-----------+----------+--------------+  PFV      Full                                                        +---------+---------------+---------+-----------+----------+--------------+ POP      Full           Yes      Yes                                 +---------+---------------+---------+-----------+----------+--------------+ PTV      Full                                                        +---------+---------------+---------+-----------+----------+--------------+ PERO     Full                                                        +---------+---------------+---------+-----------+----------+--------------+   +----+---------------+---------+-----------+----------+--------------+ LEFTCompressibilityPhasicitySpontaneityPropertiesThrombus Aging +----+---------------+---------+-----------+----------+--------------+ CFV Full           Yes      Yes                                 +----+---------------+---------+-----------+----------+--------------+     Summary: RIGHT: - There is no evidence of deep vein thrombosis in the lower extremity. However, portions of this examination were limited- see technologist comments above.  - No cystic structure found in the popliteal fossa.  LEFT: - No evidence of common femoral vein obstruction.  *See table(s) above for measurements and observations. Electronically signed by Monica Martinez MD on 07/19/2020 at 2:45:37 PM.    Final     Microbiology: Recent  Results (from the past 240 hour(s))  SARS CORONAVIRUS 2 (TAT 6-24 HRS) Nasopharyngeal Nasopharyngeal Swab     Status: None   Collection Time: 07/16/20  2:10 PM   Specimen: Nasopharyngeal Swab  Result Value Ref Range Status   SARS Coronavirus 2 NEGATIVE NEGATIVE Final    Comment: (NOTE) SARS-CoV-2 target nucleic acids are NOT DETECTED.  The SARS-CoV-2 RNA is generally detectable in upper and lower respiratory specimens during the acute phase of infection. Negative results do not preclude SARS-CoV-2 infection, do not rule out co-infections with other pathogens, and should not be used as the sole basis for treatment or other patient management decisions. Negative results must be combined with clinical observations, patient history, and epidemiological information. The expected result is Negative.  Fact Sheet for Patients: SugarRoll.be  Fact Sheet for Healthcare Providers: https://www.woods-mathews.com/  This test is not yet approved or cleared by the [place] FDA and  has been authorized for detection and/or diagnosis of SARS-CoV-2 by FDA under an Emergency Use Authorization (EUA). This EUA will remain  in effect (meaning this test can be used) for the duration of the COVID-19 declaration under Se ction 564(b)(1) of the Act, 21 U.S.C. section 360bbb-3(b)(1), unless the authorization is terminated or revoked sooner.  Performed at  [facility], [place] [address], [place], [place] [address]   SARS CORONAVIRUS 2 (TAT 6-24 HRS) Nasopharyngeal Nasopharyngeal Swab     Status: None   Collection Time: 07/19/20  5:57 PM   Specimen: Nasopharyngeal Swab  Result Value Ref Range Status   SARS Coronavirus 2 NEGATIVE NEGATIVE Final    Comment: (NOTE) SARS-CoV-2 target nucleic acids are NOT DETECTED.  The SARS-CoV-2 RNA is generally detectable in upper and lower respiratory specimens during the acute phase of infection. Negative results do not  preclude SARS-CoV-2 infection, do not rule out co-infections with other pathogens, and should not be used as the sole basis for treatment or other patient management decisions. Negative results must be combined with clinical observations, patient history, and epidemiological information. The expected result is Negative.  Fact Sheet for Patients: SugarRoll.be  Fact Sheet for Healthcare Providers: https://www.woods-mathews.com/  This test is not yet approved or cleared by the [place] FDA and  has been authorized for detection and/or diagnosis of SARS-CoV-2 by FDA under an Emergency Use Authorization (EUA). This EUA will remain  in effect (meaning this test can be used) for the duration of the COVID-19 declaration under Se ction 564(b)(1) of the Act, 21 U.S.C. section 360bbb-3(b)(1), unless the authorization is terminated or revoked sooner.  Performed at [facility], [place] [address], [place], [place] [address]      Labs: Basic Metabolic Panel: Recent Labs  Lab 07/16/20 1037 07/16/20 1348 07/17/20 0609 07/18/20 0959 07/19/20 0634 07/20/20 0521  NA 121*  --  131* 137 140 139  K 3.9  --  3.5 3.7 3.7 3.6  CL 89*  --  97* 101 105 102  CO2 20*  --  23 27 25 26   GLUCOSE 121*  --  113* 121* 127* 128*  BUN 62*  --  50* 26* 23 22  CREATININE 8.31*  --  4.15* 1.93* 1.35* 1.20  CALCIUM 8.6*  --  8.8* 9.0 9.1 9.3  MG  --  1.8  --   --   --   --   PHOS  --   --   --   --   --  2.8   Liver Function Tests: Recent Labs  Lab 07/18/20 0959 07/19/20 0634 07/20/20 0521  AST 21 18  --   ALT 22 23  --   ALKPHOS 89 92  --   BILITOT 0.8 0.5  --   PROT 6.5 6.8  --   ALBUMIN 3.0* 3.1* 3.1*   No results for input(s): LIPASE, AMYLASE in the last 168 hours. No results for input(s): AMMONIA in the last 168 hours. CBC: Recent Labs  Lab 07/16/20 1348 07/17/20 0609 07/18/20 0959 07/19/20 0634  WBC 6.2 5.5 4.9 5.1  NEUTROABS 5.1  --   3.3 3.4  HGB 10.4* 10.2* 10.0* 10.3*  HCT 30.7* 29.9* 30.1* 31.6*  MCV 78.1* 78.5* 80.5 81.9  PLT 265 243 263 261   Cardiac Enzymes: No results for input(s): CKTOTAL, CKMB, CKMBINDEX, TROPONINI in the last 168 hours. BNP: BNP (last 3 results) No results for input(s): BNP in the last 8760 hours.  ProBNP (last 3 results) No results for input(s): PROBNP in the last 8760 hours.  CBG: No results for input(s): GLUCAP in the last 168 hours.     Signed:  Nita Sells MD   [facility] 07/20/2020, 9:15 AM

## 2020-07-20 NOTE — Evaluation (Signed)
Occupational Therapy Evaluation Patient Details Name: Benjamin Hamilton MRN: [id number] DOB: 1950-08-19 Today's Date: 07/20/2020    History of Present Illness Pt is a 70 year old male admitted for Postobstructive AKI secondary to prostate cancer, use of diuretics/Bactrim and PMHx significant for HTN, HLD, asthma, nonobstructive cardiac disease based on cath 2011   Clinical Impression   PTA patient was living alone in a private residence and was grossly independent with ADLs/IADLs including driving. Patient currently functioning below baseline requiring Min guard grossly for functional transfers and short-distance functional mobility in hospital room. Patient also requires Max A for LB bathing/dressing. Patient also limited by decreased dynamic standing balance, decreased activity tolerance, swelling in BLE R>L, and scrotal swelling. Patient would benefit from continued acute OT services in prep for safe d/c to next level of care with recommendation for SNF rehab given patient's CLOF, need for external assist, and limited support from family. Patient in agreement with POC.     Follow Up Recommendations  SNF    Equipment Recommendations  Other (comment) (TBD)    Recommendations for Other Services       Precautions / Restrictions Precautions Precautions: Fall Restrictions Weight Bearing Restrictions: No      Mobility Bed Mobility Overal bed mobility: Needs Assistance Bed Mobility: Supine to Sit     Supine to sit: Min assist     General bed mobility comments: Assis to advance LLE from bed surface to EOB.    Transfers Overall transfer level: Needs assistance Equipment used: Rolling walker (2 wheeled) Transfers: Sit to/from Stand Sit to Stand: Min guard         General transfer comment: Cues for hand placement.    Balance Overall balance assessment: Needs assistance Sitting-balance support: No upper extremity supported;Feet supported Sitting balance-Leahy Scale: Good      Standing balance support: Bilateral upper extremity supported;During functional activity Standing balance-Leahy Scale: Poor Standing balance comment: reliant on UE at this time                           ADL either performed or assessed with clinical judgement   ADL Overall ADL's : Needs assistance/impaired     Grooming: Set up;Sitting               Lower Body Dressing: Maximal assistance;Sit to/from stand Lower Body Dressing Details (indicate cue type and reason): Max A to don footwear seated EOB. Patient unable to attain figure-4 position 2/2 BLE and scrotal swelling. Toilet Transfer: Designer, television/film set Details (indicate cue type and reason): Simulated with stand-pivot transfer to recliner.         Functional mobility during ADLs: Min guard;Rolling walker General ADL Comments: Min guard for short-distance functional mobility in hospital room with use of RW.     Vision Baseline Vision/History: Wears glasses Wears Glasses: Reading only Patient Visual Report: No change from baseline Vision Assessment?: No apparent visual deficits     Perception     Praxis      Pertinent Vitals/Pain Pain Assessment: Faces Faces Pain Scale: Hurts a little bit Pain Location: feet (edema) and groin 2/2 scrotal swelling Pain Descriptors / Indicators: Tightness;Sore Pain Intervention(s): Limited activity within patient's tolerance;Monitored during session;Repositioned     Hand Dominance Right   Extremity/Trunk Assessment Upper Extremity Assessment Upper Extremity Assessment: Overall WFL for tasks assessed   Lower Extremity Assessment Lower Extremity Assessment: Defer to PT evaluation   Cervical / Trunk Assessment Cervical / Trunk  Assessment: Normal   Communication Communication Communication: No difficulties   Cognition Arousal/Alertness: Awake/alert Behavior During Therapy: WFL for tasks assessed/performed Overall Cognitive Status: Within Functional  Limits for tasks assessed                                     General Comments  Scrotal swelling with blood present. RN notified.    Exercises     Shoulder Instructions      Home Living Family/patient expects to be discharged to:: Private residence Living Arrangements: Alone Available Help at Discharge: Other (Comment) (None)   Home Access: Level entry     Home Layout: One level               Home Equipment: None          Prior Functioning/Environment Level of Independence: Independent                 OT Problem List: Decreased strength;Decreased activity tolerance;Impaired balance (sitting and/or standing);Decreased knowledge of use of DME or AE;Pain;Increased edema      OT Treatment/Interventions: Self-care/ADL training;Therapeutic exercise;Energy conservation;DME and/or AE instruction;Therapeutic activities;Patient/family education;Balance training    OT Goals(Current goals can be found in the care plan section) Acute Rehab OT Goals Patient Stated Goal: To go to rehab before returning home. OT Goal Formulation: With patient Time For Goal Achievement: 08/03/20 Potential to Achieve Goals: Good ADL Goals Pt Will Perform Grooming: with modified independence;standing Pt Will Perform Lower Body Dressing: with modified independence;sit to/from stand;with adaptive equipment Pt Will Transfer to Toilet: with modified independence;ambulating Pt Will Perform Toileting - Clothing Manipulation and hygiene: with modified independence;sit to/from stand;with adaptive equipment Pt/caregiver will Perform Home Exercise Program: Increased strength;Both right and left upper extremity;With written HEP provided  OT Frequency: Min 2X/week   Barriers to D/C: Decreased caregiver support  Patient lives alone. PRN supervision/assist only.       Co-evaluation              AM-PAC OT "6 Clicks" Daily Activity     Outcome Measure Help from another person  eating meals?: None Help from another person taking care of personal grooming?: A Little Help from another person toileting, which includes using toliet, bedpan, or urinal?: A Lot Help from another person bathing (including washing, rinsing, drying)?: A Little Help from another person to put on and taking off regular upper body clothing?: None Help from another person to put on and taking off regular lower body clothing?: A Lot 6 Click Score: 18   End of Session Equipment Utilized During Treatment: Gait belt;Rolling walker Nurse Communication: Mobility status;Other (comment) (Unable to have BM for several days)  Activity Tolerance: Patient tolerated treatment well Patient left: in chair;with call bell/phone within reach;with chair alarm set  OT Visit Diagnosis: Unsteadiness on feet (R26.81);Muscle weakness (generalized) (M62.81);Pain Pain - Right/Left: Right Pain - part of body: Leg                Time: 7062-3762 OT Time Calculation (min): 16 min Charges:  OT General Charges $OT Visit: 1 Visit OT Evaluation $OT Eval Moderate Complexity: 1 Mod  Yvana Samonte H. OTR/L Supplemental OT, Department of rehab services [phone]  Zakariah Dejarnette R H. 07/20/2020, 8:11 AM

## 2020-07-20 NOTE — Discharge Instructions (Signed)
Acute Kidney Injury, Adult  Acute kidney injury is a sudden worsening of kidney function. The kidneys are organs that have several jobs. They filter the blood to remove waste products and extra fluid. They also maintain a healthy balance of minerals and hormones in the body, which helps control blood pressure and keep bones strong. With this condition, your kidneys do not do their jobs as well as they should. This condition ranges from mild to severe. Over time it may develop into long-lasting (chronic) kidney disease. Early detection and treatment may prevent acute kidney injury from developing into a chronic condition. What are the causes? Common causes of this condition include:  A problem with blood flow to the kidneys. This may be caused by: ? Low blood pressure (hypotension) or shock. ? Blood loss. ? Heart and blood vessel (cardiovascular) disease. ? Severe burns. ? Liver disease.  Direct damage to the kidneys. This may be caused by: ? Certain medicines. ? A kidney infection. ? Poisoning. ? Being around or in contact with toxic substances. ? A surgical wound. ? A hard, direct hit to the kidney area.  A sudden blockage of urine flow. This may be caused by: ? Cancer. ? Kidney stones. ? An enlarged prostate in males. What are the signs or symptoms? Symptoms of this condition may not be obvious until the condition becomes severe. Symptoms of this condition can include:  Tiredness (lethargy), or difficulty staying awake.  Nausea or vomiting.  Swelling (edema) of the face, legs, ankles, or feet.  Problems with urination, such as: ? Abdominal pain, or pain along the side of your stomach (flank). ? Decreased urine production. ? Decrease in the force of urine flow.  Muscle twitches and cramps, especially in the legs.  Confusion or trouble concentrating.  Loss of appetite.  Fever. How is this diagnosed? This condition may be diagnosed with tests, including:  Blood  tests.  Urine tests.  Imaging tests.  A test in which a sample of tissue is removed from the kidneys to be examined under a microscope (kidney biopsy). How is this treated? Treatment for this condition depends on the cause and how severe the condition is. In mild cases, treatment may not be needed. The kidneys may heal on their own. In more severe cases, treatment will involve:  Treating the cause of the kidney injury. This may involve changing any medicines you are taking or adjusting your dosage.  Fluids. You may need specialized IV fluids to balance your body's needs.  Having a catheter placed to drain urine and prevent blockages.  Preventing problems from occurring. This may mean avoiding certain medicines or procedures that can cause further injury to the kidneys. In some cases treatment may also require:  A procedure to remove toxic wastes from the body (dialysis or continuous renal replacement therapy - CRRT).  Surgery. This may be done to repair a torn kidney, or to remove the blockage from the urinary system. Follow these instructions at home: Medicines  Take over-the-counter and prescription medicines only as told by your health care provider.  Do not take any new medicines without your health care provider's approval. Many medicines can worsen your kidney damage.  Do not take any vitamin and mineral supplements without your health care provider's approval. Many nutritional supplements can worsen your kidney damage. Lifestyle  If your health care provider prescribed changes to your diet, follow them. You may need to decrease the amount of protein you eat.  Achieve and maintain a healthy   weight. If you need help with this, ask your health care provider.  Start or continue an exercise plan. Try to exercise at least 30 minutes a day, 5 days a week.  Do not use any tobacco products, such as cigarettes, chewing tobacco, and e-cigarettes. If you need help quitting, ask your  health care provider. General instructions  Keep track of your blood pressure. Report changes in your blood pressure as told by your health care provider.  Stay up to date with immunizations. Ask your health care provider which immunizations you need.  Keep all follow-up visits as told by your health care provider. This is important. Where to find more information  American Association of Kidney Patients: BombTimer.gl  National Kidney Foundation: www.kidney.[place]: https://mathis.com/  Life Options [facility]: ? www.lifeoptions.org ? www.kidneyschool.org Contact a health care provider if:  Your symptoms get worse.  You develop new symptoms. Get help right away if:  You develop symptoms of worsening kidney disease, which include: ? Headaches. ? Abnormally dark or light skin. ? Easy bruising. ? Frequent hiccups. ? Chest pain. ? Shortness of breath. ? End of menstruation in women. ? Seizures. ? Confusion or altered mental status. ? Abdominal or back pain. ? Itchiness.  You have a fever.  Your body is producing less urine.  You have pain or bleeding when you urinate. Summary  Acute kidney injury is a sudden worsening of kidney function.  Acute kidney injury can be caused by problems with blood flow to the kidneys, direct damage to the kidneys, and sudden blockage of urine flow.  Symptoms of this condition may not be obvious until it becomes severe. Symptoms may include edema, lethargy, confusion, nausea or vomiting, and problems passing urine.  This condition can usually be diagnosed with blood tests, urine tests, and imaging tests. Sometimes a kidney biopsy is done to diagnose this condition.  Treatment for this condition often involves treating the underlying cause. It is treated with fluids, medicines, dialysis, diet changes, or surgery. This information is not intended to replace advice given to you by your health care provider. Make  sure you discuss any questions you have with your health care provider. Document Revised: 06/15/2017 Document Reviewed: 06/23/2016 Elsevier Patient Education  2020 [place].   Acute Urinary Retention, Male  Acute urinary retention is a condition in which a person is unable to pass urine. This can last for a short time or for a long time. If left untreated, it can result in kidney damage or other serious complications. What are the causes? This condition may be caused by:  Obstruction or narrowing of the tube that drains the bladder (urethra). This may be caused by surgery or problems with nearby organs, such as the prostate gland, which can press or squeeze the urethra.  Problems with the nerves in the bladder. These can be caused by diseases, such as multiple sclerosis, or by spinal cord injuries.  Certain medicines.  Tumors in the area of the pelvis, bladder, or urethra.  Diabetes.  Degenerative cognitive conditions such as delirium or dementia.  Bladder or urinary tract infection.  Constipation.  Blood in the urine (hematuria).  Injury to the bladder or urethra.  Psychological (psychogenic) conditions. Someone may hold his urine due to trauma or because he does not want to use the bathroom. What increases the risk? This condition is more likely to develop in older men. As men age, their prostate may become larger and may start pressing or squeezing  on the bladder or the urethra. What are the signs or symptoms? Symptoms of this condition include:  Trouble urinating.  Pain in the lower abdomen. Symptoms usually come on slowly over a long period of time. How is this diagnosed? This condition is diagnosed based on a physical exam and a medical history. You may also have other tests, including:  An ultrasound of the bladder or kidneys or both.  Blood tests.  A urine analysis.  Additional tests may be needed such as an MRI, kidney, or bladder function tests. How  is this treated? Treatment for this condition may include:  Medicines.  Placing a thin, sterile tube (catheter) into the bladder to drain urine out of the body. This is called an indwelling urinary catheter. After being inserted, the catheter is held in place with a small balloon that is filled with sterile water. Urine drains from the catheter into a collection bag outside of the body.  Behavioral therapy.  Treatment for any underlying conditions.  If needed, you may be treated in the hospital for kidney function problems or to manage other complications. Follow these instructions at home:  Take over-the-counter and prescription medicines only as told by your health care provider. Avoid certain medicines, such as decongestants, antihistamines, and some prescription medicines. Do not take any medicine unless your health care provider has approved.  If you were given an indwelling urinary catheter, take care of it as told by your health care provider.  Drink enough fluid to keep your urine clear or pale yellow.  If you were prescribed an antibiotic, take it as told by your health care provider. Do not stop taking the antibiotic even if you start to feel better.  Do not use any products that contain nicotine or tobacco, such as cigarettes and e-cigarettes. If you need help quitting, ask your health care provider.  Monitor any changes in your symptoms. Tell your health care provider about any changes.  If instructed, monitor your blood pressure at home. Report changes as told by your health care provider.  Keep all follow-up visits as told by your health care provider. This is important. Contact a health care provider if:  You have uncomfortable bladder contractions that you cannot control (spasms) or you leak urine with the spasms. Get help right away if:  You have chills or fever.  You have blood in your urine.  You have a catheter and: ? Your catheter stops draining  urine. ? Your catheter falls out. Summary  Acute urinary retention is a condition in which a person is unable to pass urine. If left untreated, it can result in kidney damage or other serious complications.  The cause of this condition may include an enlarged prostate. As men age, their prostate gland may become larger and may start pressing or squeezing on the bladder or the urethra.  Treatment for this condition may include medicines and placement of an indwelling urinary catheter.  Monitor any changes in your symptoms. Tell your health care provider about any changes. This information is not intended to replace advice given to you by your health care provider. Make sure you discuss any questions you have with your health care provider. Document Revised: 06/15/2017 Document Reviewed: 08/04/2016 Elsevier Patient Education  2020 [employer].

## 2020-07-21 ENCOUNTER — Telehealth: Payer: Self-pay | Admitting: Internal Medicine

## 2020-07-21 NOTE — Telephone Encounter (Signed)
  [facility] calling to request  order for iron infusion. Patient scheduled for 07/22/20 Please enter/ fax to [phone]

## 2020-07-22 ENCOUNTER — Encounter (HOSPITAL_COMMUNITY): Payer: Medicare Other

## 2020-07-22 ENCOUNTER — Other Ambulatory Visit: Payer: Self-pay | Admitting: Internal Medicine

## 2020-07-22 NOTE — Telephone Encounter (Signed)
Sent to Dr. John. 

## 2020-07-22 NOTE — Telephone Encounter (Signed)
Sent to Dr Jones

## 2020-07-22 NOTE — Telephone Encounter (Signed)
Is dr Yetta Barre here today - as he is the ordering MD - can this be forwarded?

## 2020-07-22 NOTE — Addendum Note (Signed)
Addended by: Etta Grandchild on: 07/22/2020 09:07 AM   Modules accepted: Orders

## 2020-07-28 ENCOUNTER — Telehealth: Payer: Self-pay | Admitting: Internal Medicine

## 2020-07-28 NOTE — Telephone Encounter (Signed)
° °  Daughter calling to request referral to [facility] After recent hospital stay she feels a nephrology referral would be beneficial

## 2020-07-28 NOTE — Telephone Encounter (Signed)
No need for this since his kidneys were back to normal at time of discharge; we can further discuss his needs at his post hosp visit

## 2020-07-29 NOTE — Telephone Encounter (Signed)
Tried calling pt sister # that was given lady states know one is there by that name (it was a business). Called pt there was no answer and couldn't leave msg due to vm not set-up.Marland KitchenJohny Hamilton

## 2020-07-30 NOTE — Telephone Encounter (Signed)
Tried to call pt again still no answer and not able to leave vm due to vm not set-up. Pt has appt scheduled to see MD 08/03/20.Marland KitchenJohny Hamilton

## 2020-08-03 ENCOUNTER — Ambulatory Visit: Payer: Medicare Other | Admitting: Internal Medicine

## 2020-08-03 ENCOUNTER — Encounter: Payer: Self-pay | Admitting: Internal Medicine

## 2020-08-03 ENCOUNTER — Ambulatory Visit (INDEPENDENT_AMBULATORY_CARE_PROVIDER_SITE_OTHER): Payer: Medicare Other | Admitting: Internal Medicine

## 2020-08-03 ENCOUNTER — Other Ambulatory Visit: Payer: Self-pay

## 2020-08-03 VITALS — BP 106/64 | HR 102 | Temp 97.5°F | Ht 73.0 in | Wt 244.0 lb

## 2020-08-03 DIAGNOSIS — F32A Depression, unspecified: Secondary | ICD-10-CM | POA: Insufficient documentation

## 2020-08-03 DIAGNOSIS — R600 Localized edema: Secondary | ICD-10-CM

## 2020-08-03 DIAGNOSIS — F3289 Other specified depressive episodes: Secondary | ICD-10-CM

## 2020-08-03 DIAGNOSIS — N179 Acute kidney failure, unspecified: Secondary | ICD-10-CM | POA: Diagnosis not present

## 2020-08-03 DIAGNOSIS — R634 Abnormal weight loss: Secondary | ICD-10-CM | POA: Insufficient documentation

## 2020-08-03 DIAGNOSIS — N401 Enlarged prostate with lower urinary tract symptoms: Secondary | ICD-10-CM

## 2020-08-03 DIAGNOSIS — N138 Other obstructive and reflux uropathy: Secondary | ICD-10-CM

## 2020-08-03 DIAGNOSIS — I77810 Thoracic aortic ectasia: Secondary | ICD-10-CM | POA: Insufficient documentation

## 2020-08-03 LAB — BASIC METABOLIC PANEL
BUN: 23 mg/dL (ref 6–23)
CO2: 31 mEq/L (ref 19–32)
Calcium: 9.7 mg/dL (ref 8.4–10.5)
Chloride: 94 mEq/L — ABNORMAL LOW (ref 96–112)
Creatinine, Ser: 1.58 mg/dL — ABNORMAL HIGH (ref 0.40–1.50)
GFR: 45 mL/min — ABNORMAL LOW (ref >60.00)
Glucose, Bld: 130 mg/dL — ABNORMAL HIGH (ref 70–99)
Potassium: 3 mEq/L — ABNORMAL LOW (ref 3.5–5.1)
Sodium: 134 mEq/L — ABNORMAL LOW (ref 135–145)

## 2020-08-03 LAB — CBC WITH DIFFERENTIAL/PLATELET
Basophils Absolute: 0.1 10*3/uL (ref 0.0–0.1)
Basophils Relative: 0.7 % (ref 0.0–3.0)
Eosinophils Absolute: 0 10*3/uL (ref 0.0–0.7)
Eosinophils Relative: 0.4 % (ref 0.0–5.0)
HCT: 35.6 % — ABNORMAL LOW (ref 39.0–52.0)
Hemoglobin: 11.5 g/dL — ABNORMAL LOW (ref 13.0–17.0)
Lymphocytes Relative: 10.6 % — ABNORMAL LOW (ref 12.0–46.0)
Lymphs Abs: 0.8 10*3/uL (ref 0.7–4.0)
MCHC: 32.4 g/dL (ref 30.0–36.0)
MCV: 81 fl (ref 78.0–100.0)
Monocytes Absolute: 0.8 10*3/uL (ref 0.1–1.0)
Monocytes Relative: 10.3 % (ref 3.0–12.0)
Neutro Abs: 6.2 10*3/uL (ref 1.4–7.7)
Neutrophils Relative %: 78 % — ABNORMAL HIGH (ref 43.0–77.0)
Platelets: 329 10*3/uL (ref 150.0–400.0)
RBC: 4.39 Mil/uL (ref 4.22–5.81)
RDW: 15.6 % — ABNORMAL HIGH (ref 11.5–15.5)
WBC: 7.9 10*3/uL (ref 4.0–10.5)

## 2020-08-03 MED ORDER — MIRTAZAPINE 15 MG PO TABS: 15.0000 mg | ORAL_TABLET | Freq: Every day | ORAL | 1 refills | Status: AC

## 2020-08-03 NOTE — Patient Instructions (Signed)
Please take all new medication as prescribed - the remeron 15 mg at bedtime for depression and weight loss  Please continue all other medications as before, and refills have been done if requested.  Please have the pharmacy call with any other refills you may need.  Please keep your appointments with your specialists as you may have planned - urology soon  Please go to the LAB at the blood drawing area for the tests to be done  You will be contacted by phone if any changes need to be made immediately.  Otherwise, you will receive a letter about your results with an explanation, but please check with MyChart first.  Please remember to sign up for MyChart if you have not done so, as this will be important to you in the future with finding out test results, communicating by private email, and scheduling acute appointments online when needed.  Please make an Appointment to return in 3 months to consider f/u A1c and CT for the aorta

## 2020-08-03 NOTE — Progress Notes (Addendum)
Established Patient Office Visit  Subjective:  Patient ID: Benjamin Hamilton, male    DOB: Oct 13, 1950  Age: 70 y.o. MRN: [id number]      Chief Complaint:       HPI:  Benjamin Hamilton is a 70 y.o. male here to f/u above; now at [facility] with limit of Jan 24 to then go home, getting PT for now, walking with walker, no recent falls; did have urology last wk with 4 hour voiding trial unsuccessful with Dr Louis Meckel, and now catheter replaced, has f/u appt feb 3 for prostate biopsy.  May have had small blood in the urine recently per family with him, but no other GU complaints.  Has also had bilateral LE edema but seems to be getting better per family and now best it has been for several wks.  Denies urinary symptoms such as dysuria, frequency, urgency, flank pain, hematuria or n/v, fever, chills. Of note is large wt loss since hospn, and for first time with questioning today admits to marked depression symptoms, but no si or hi.  Pt denies chest pain, increased sob or doe, wheezing, orthopnea, PND, increased LE swelling, palpitations, dizziness or syncope.   Pt denies polydipsia, polyuria.  Also incidnetly noted on Echo is aortic root dialtion 4.5 cm, with recommendation for f/u ct at 3 mo with a1c f/u as well.   Pt has no oter new complaints Wt Readings from Last 3 Encounters:  08/03/20 244 lb (110.7 kg)  07/16/20 284 lb 6.3 oz (129 kg)  07/14/20 295 lb (133.8 kg)   BP Readings from Last 3 Encounters:  08/03/20 106/64  07/20/20 131/75  07/14/20 138/76     Past Medical History:  Diagnosis Date  . Asthma   . Chest pain    a. cath 7/11: dLAD 30%, EF 55-60% (done after abnl Myoview with 5 bts polymorphic VT)  . Hypercholesteremia   . Hypertension   . SOB (shortness of breath)    a. PFTs ok; b. Chest CT 11/11 with bronchial wall thickening, no mass or parenchymal disease;  c. seen by Dr. Gwenette Greet - ? environment asthma vs. GERD (PPI no help)  . Wears dentures    top   Past Surgical  History:  Procedure Laterality Date  . CARDIAC CATHETERIZATION  2011  . HERNIA REPAIR  1988   right ing   . OPEN REDUCTION INTERNAL FIXATION (ORIF) DISTAL RADIAL FRACTURE Left 06/15/2014   Procedure: OPEN REDUCTION INTERNAL FIXATION (ORIF) LEFT DISTAL RADIAL FRACTURE;  Surgeon: Jolyn Nap, MD;  Location: [facility];  Service: Orthopedics;  Laterality: Left;  ANESTHESIA:  GENERAL/INTERSCALENE BLOCK    reports that he has never smoked. He has never used smokeless tobacco. He reports current alcohol use of about 6.0 standard drinks of alcohol per week. He reports that he does not use drugs. family history includes Asthma in his mother; Diabetes in his mother; Heart attack (age of onset: 54) in his father; Hypertension in his father and mother. No Known Allergies Current Outpatient Medications on File Prior to Visit  Medication Sig Dispense Refill  . albuterol (VENTOLIN HFA) 108 (90 Base) MCG/ACT inhaler Inhale 2 puffs into the lungs every 4 (four) hours as needed for wheezing or shortness of breath. 18 g 3  . alfuzosin (UROXATRAL) 10 MG 24 hr tablet Take 1 tablet (10 mg total) by mouth daily with breakfast. 90 tablet 1  . amLODipine (NORVASC) 5 MG tablet Take 1 tablet (5 mg total) by  mouth daily. 90 tablet 3  . aspirin EC 81 MG tablet Take 1 tablet (81 mg total) by mouth daily.    . budesonide-formoterol (SYMBICORT) 160-4.5 MCG/ACT inhaler Inhale 2 puffs into the lungs 2 (two) times daily. 10.2 g 3  . furosemide (LASIX) 40 MG tablet Take 1 tablet (40 mg total) by mouth daily. 30 tablet 5  . hydrochlorothiazide (HYDRODIURIL) 25 MG tablet Take 0.5 tablets (12.5 mg total) by mouth daily. 45 tablet 3   No current facility-administered medications on file prior to visit.        ROS:  All others reviewed and negative.  Objective        PE:  BP 106/64   Pulse (!) 102   Temp (!) 97.5 F (36.4 C) (Oral)   Ht 6\' 1"  (1.854 m)   Wt 244 lb (110.7 kg)   SpO2 94%   BMI 32.19  kg/m                 Constitutional: Pt appears in NAD               HENT: Head: NCAT.                Right Ear: External ear normal.                 Left Ear: External ear normal.                Eyes: . Pupils are equal, round, and reactive to light. Conjunctivae and EOM are normal               Nose: without d/c or deformity               Neck: Neck supple. Gross normal ROM               Cardiovascular: Normal rate and regular rhythm.                 Pulmonary/Chest: Effort normal and breath sounds without rales or wheezing.                Abd:  Soft, NT, ND, + BS, no organomegaly               Neurological: Pt is alert. At baseline orientation, motor grossly intact               Skin: Skin is warm. No rashes, no other new lesions, LE edema - trace to 1+ bilateral right > left               Psychiatric: Pt behavior is normal without agitation   Assessment/Plan:  Benjamin Hamilton is a 70 y.o. Black or African American [2] male with  has a past medical history of Asthma, Chest pain, Hypercholesteremia, Hypertension, SOB (shortness of breath), and Wears dentures.   Assessment Plan  See problem oriented assessment and plan Labs/data reviewed for each problem:  Micro: none  Cardiac tracings I have personally interpreted today:  none  Pertinent Radiological findings (summarize): none    Health Maintenance Due  Topic Date Due  . Hepatitis C Screening  Never done  . FOOT EXAM  Never done  . OPHTHALMOLOGY EXAM  Never done    There are no preventive care reminders to display for this patient.  Lab Results  Component Value Date   TSH 1.807 07/16/2020   Lab Results  Component Value Date   WBC 7.9 08/03/2020   HGB  11.5 (L) 08/03/2020   HCT 35.6 (L) 08/03/2020   MCV 81.0 08/03/2020   PLT 329.0 08/03/2020   Lab Results  Component Value Date   NA 134 (L) 08/03/2020   K 3.0 (L) 08/03/2020   CO2 31 08/03/2020   GLUCOSE 130 (H) 08/03/2020   BUN 23 08/03/2020   CREATININE  1.58 (H) 08/03/2020   BILITOT 0.5 07/19/2020   ALKPHOS 92 07/19/2020   AST 18 07/19/2020   ALT 23 07/19/2020   PROT 6.8 07/19/2020   ALBUMIN 3.1 (L) 07/20/2020   CALCIUM 9.7 08/03/2020   ANIONGAP 11 07/20/2020   GFR 45.00 (L) 08/03/2020   Lab Results  Component Value Date   CHOL 194 05/06/2020   Lab Results  Component Value Date   HDL 40.70 05/06/2020   Lab Results  Component Value Date   LDLCALC 137 (H) 05/06/2020   Lab Results  Component Value Date   TRIG 84.0 05/06/2020   Lab Results  Component Value Date   CHOLHDL 5 05/06/2020   Lab Results  Component Value Date   HGBA1C 7.3 (H) 05/06/2020      Assessment & Plan:   Problem List Items Addressed This Visit      High   Aortic root dilation (HCC)    For f/U ct at next visit 3 mo        Medium   Weight loss    Exam o/w benign, cont to follow      Leg edema    Improving with better renal fxn, cont to follow without change in med tx today      Depression    With reduced appetite and marked wt loss - for remeron 15 mg qhs      Relevant Medications   mirtazapine (REMERON) 15 MG tablet   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 mirtazapine (REMERON) 15 MG tablet    Sig: Take 1 tablet (15 mg total) by mouth at bedtime.    Dispense:  90 tablet    Refill:  1    Follow-up: Return in about 3 months (around 11/01/2020).   Cathlean Cower, MD 08/03/2020 2:00 PM [facility]

## 2020-08-03 NOTE — Assessment & Plan Note (Signed)
Very severe on jan 1 now improved with improved post renal obstruction, for f/u cbc bmp today

## 2020-08-03 NOTE — Assessment & Plan Note (Signed)
Exam o/w benign, cont to follow

## 2020-08-03 NOTE — Assessment & Plan Note (Signed)
With reduced appetite and marked wt loss - for remeron 15 mg qhs

## 2020-08-03 NOTE — Assessment & Plan Note (Signed)
Improving with better renal fxn, cont to follow without change in med tx today

## 2020-08-03 NOTE — Assessment & Plan Note (Signed)
To continue indwelling foley for now and f/u urology as planned feb 3

## 2020-08-03 NOTE — Assessment & Plan Note (Signed)
For f/U ct at next visit 3 mo

## 2020-08-08 ENCOUNTER — Other Ambulatory Visit: Payer: Self-pay | Admitting: Internal Medicine

## 2020-08-08 ENCOUNTER — Encounter: Payer: Self-pay | Admitting: Internal Medicine

## 2020-08-08 MED ORDER — POTASSIUM CHLORIDE ER 10 MEQ PO TBCR: 10.0000 meq | EXTENDED_RELEASE_TABLET | Freq: Every day | ORAL | 3 refills | Status: AC

## 2020-08-12 ENCOUNTER — Telehealth: Payer: Self-pay | Admitting: Internal Medicine

## 2020-08-12 NOTE — Telephone Encounter (Signed)
Death Certificate signed by MD. Copy faxed over to funeral home, Funeral home contacted for pick up and HIM emailed to mark chart deceased.

## 2020-08-12 NOTE — Telephone Encounter (Signed)
[facility] home called regarding a death certificate for this patient.  It was submitted to you online through the portal.

## 2020-08-17 DEATH — deceased

## 2020-11-04 ENCOUNTER — Ambulatory Visit: Payer: Medicare Other | Admitting: Internal Medicine
